# Patient Record
Sex: Male | Born: 1960
Health system: Southern US, Community
[De-identification: ages and names within clinical notes are randomized; demographics above are authoritative.]

## PROBLEM LIST (undated history)

## (undated) DIAGNOSIS — F32A Depression, unspecified: Secondary | ICD-10-CM

## (undated) DIAGNOSIS — C801 Malignant (primary) neoplasm, unspecified: Secondary | ICD-10-CM

## (undated) DIAGNOSIS — I10 Essential (primary) hypertension: Secondary | ICD-10-CM

## (undated) DIAGNOSIS — E785 Hyperlipidemia, unspecified: Secondary | ICD-10-CM

## (undated) DIAGNOSIS — F329 Major depressive disorder, single episode, unspecified: Secondary | ICD-10-CM

## (undated) DIAGNOSIS — E119 Type 2 diabetes mellitus without complications: Secondary | ICD-10-CM

## (undated) DIAGNOSIS — D61818 Other pancytopenia: Secondary | ICD-10-CM

## (undated) DIAGNOSIS — K7460 Unspecified cirrhosis of liver: Secondary | ICD-10-CM

## (undated) DIAGNOSIS — F1010 Alcohol abuse, uncomplicated: Secondary | ICD-10-CM

## (undated) DIAGNOSIS — D696 Thrombocytopenia, unspecified: Secondary | ICD-10-CM

## (undated) HISTORY — DX: Major depressive disorder, single episode, unspecified: F32.9

## (undated) HISTORY — DX: Thrombocytopenia, unspecified: D69.6

## (undated) HISTORY — PX: OTHER SURGICAL HISTORY: SHX169

## (undated) HISTORY — DX: Other pancytopenia: D61.818

## (undated) HISTORY — DX: Hyperlipidemia, unspecified: E78.5

## (undated) HISTORY — DX: Essential (primary) hypertension: I10

## (undated) HISTORY — DX: Depression, unspecified: F32.A

## (undated) HISTORY — DX: Type 2 diabetes mellitus without complications: E11.9

---

## 1999-02-20 ENCOUNTER — Inpatient Hospital Stay (HOSPITAL_COMMUNITY): Admission: AD | Admit: 1999-02-20 | Discharge: 1999-02-23 | Payer: Self-pay | Admitting: *Deleted

## 2003-08-09 ENCOUNTER — Inpatient Hospital Stay (HOSPITAL_COMMUNITY): Admission: EM | Admit: 2003-08-09 | Discharge: 2003-08-12 | Payer: Self-pay | Admitting: Psychiatry

## 2003-10-07 ENCOUNTER — Inpatient Hospital Stay (HOSPITAL_COMMUNITY): Admission: RE | Admit: 2003-10-07 | Discharge: 2003-10-12 | Payer: Self-pay | Admitting: Psychiatry

## 2008-06-21 ENCOUNTER — Emergency Department (HOSPITAL_COMMUNITY): Admission: EM | Admit: 2008-06-21 | Discharge: 2008-06-21 | Payer: Self-pay | Admitting: Emergency Medicine

## 2010-02-12 ENCOUNTER — Encounter: Payer: Self-pay | Admitting: Gastroenterology

## 2010-03-30 ENCOUNTER — Encounter (INDEPENDENT_AMBULATORY_CARE_PROVIDER_SITE_OTHER): Payer: Self-pay | Admitting: *Deleted

## 2010-03-30 ENCOUNTER — Ambulatory Visit: Payer: Self-pay | Admitting: Gastroenterology

## 2010-04-19 ENCOUNTER — Ambulatory Visit: Payer: Self-pay | Admitting: Gastroenterology

## 2010-04-19 ENCOUNTER — Ambulatory Visit (HOSPITAL_COMMUNITY): Admission: RE | Admit: 2010-04-19 | Discharge: 2010-04-19 | Payer: Self-pay | Admitting: Gastroenterology

## 2010-07-16 ENCOUNTER — Encounter: Payer: Self-pay | Admitting: Cardiology

## 2010-08-13 ENCOUNTER — Encounter: Payer: Self-pay | Admitting: Cardiology

## 2010-08-14 ENCOUNTER — Encounter: Payer: Self-pay | Admitting: Cardiology

## 2010-08-28 NOTE — Letter (Signed)
Summary: [facility] Instructions  [facility]  [address] [place], [place] [address]   Phone: [phone]  Fax: [phone]       Chris Alvarez    1974/10/24    MRN: [id number]        Procedure Day /Date:04/19/10  Chris Alvarez     Arrival Time:8 am     Procedure Time:10 am     Location of Procedure:                     X  [facility] ( Outpatient Registration)                        PREPARATION FOR COLONOSCOPY WITH MOVIPREP   Starting 5 days prior to your procedure 04/14/10 do not eat nuts, seeds, popcorn, corn, beans, peas,  salads, or any raw vegetables.  Do not take any fiber supplements (e.g. Metamucil, Citrucel, and Benefiber).  THE DAY BEFORE YOUR PROCEDURE         DATE: 04/18/10  DAY: WED  1.  Drink clear liquids the entire day-NO SOLID FOOD  2.  Do not drink anything colored red or purple.  Avoid juices with pulp.  No orange juice.  3.  Drink at least 64 oz. (8 glasses) of fluid/clear liquids during the day to prevent dehydration and help the prep work efficiently.  CLEAR LIQUIDS INCLUDE: Water Jello Ice Popsicles Tea (sugar ok, no milk/cream) Powdered fruit flavored drinks Coffee (sugar ok, no milk/cream) Gatorade Juice: apple, white grape, white cranberry  Lemonade Clear bullion, consomm, broth Carbonated beverages (any kind) Strained chicken noodle soup Hard Candy                             4.  In the morning, mix first dose of MoviPrep solution:    Empty 1 Pouch A and 1 Pouch B into the disposable container    Add lukewarm drinking water to the top line of the container. Mix to dissolve    Refrigerate (mixed solution should be used within 24 hrs)  5.  Begin drinking the prep at 5:00 p.m. The MoviPrep container is divided by 4 marks.   Every 15 minutes drink the solution down to the next mark (approximately 8 oz) until the full liter is complete.   6.  Follow completed prep with 16 oz of clear liquid of your choice (Nothing  red or purple).  Continue to drink clear liquids until bedtime.  7.  Before going to bed, mix second dose of MoviPrep solution:    Empty 1 Pouch A and 1 Pouch B into the disposable container    Add lukewarm drinking water to the top line of the container. Mix to dissolve    Refrigerate  THE DAY OF YOUR PROCEDURE      DATE: 04/19/10  DAY: Chris Alvarez  Beginning at 5 a.m. (5 hours before procedure):         1. Every 15 minutes, drink the solution down to the next mark (approx 8 oz) until the full liter is complete.  Nothing to eat or drink after midnight except the prep solution  MEDICATION INSTRUCTIONS  Unless otherwise instructed, you should take regular prescription medications with a small sip of water   as early as possible the morning of your procedure.  Diabetic patients - see separate instructions.  OTHER INSTRUCTIONS  You will need a responsible adult at least 51 years of age to accompany you and drive you home.   This person must remain in the waiting room during your procedure.  Wear loose fitting clothing that is easily removed.  Leave jewelry and other valuables at home.  However, you may wish to bring a book to read or  an iPod/MP3 player to listen to music as you wait for your procedure to start.  Remove all body piercing jewelry and leave at home.  Total time from sign-in until discharge is approximately 2-3 hours.  You should go home directly after your procedure and rest.  You can resume normal activities the  day after your procedure.  The day of your procedure you should not:   Drive   Make legal decisions   Operate machinery   Drink alcohol   Return to work  You will receive specific instructions about eating, activities and medications before you leave.    The above instructions have been reviewed and explained to me by   _______________________    I fully understand and can verbalize these instructions  _____________________________ Date _________

## 2010-08-28 NOTE — Assessment & Plan Note (Signed)
History of Present Illness Visit Type: consult  Primary GI MD: Rob Bunting MD Primary Fernande Treiber: Edwena Felty. Charm Barges, DO Requesting Adi Seales: Edwena Felty. Charm Barges, DO Chief Complaint: needs colonoscopy History of Present Illness:     very pleasant Chris Alvarez who has seen Dr. Karilyn Cota in [place] about one year ago.  An infection was found, he was put on antibiotics.  He was having dypshagia, was stretched during EGD.  No longer has any UGI symptoms as long as he takes omeprazole.  he has never had a colonoscopy.  he tends to be constipated, probably from narcottics.  Takes miralax as needed.  no overt GI bleeding.  He takes chronic narcotic pain medicines for back problems           Current Medications (verified): 1)  Oxycontin 40 Mg Xr12h-Tab (Oxycodone Hcl) .Marland Kitchen.. 1 By Mouth Q 8 Hours 2)  Percocet 10-325 Mg Tabs (Oxycodone-Acetaminophen) .Marland Kitchen.. 1 By Mouth Q 4 Hours 3)  Tramadol Hcl 50 Mg Tabs (Tramadol Hcl) .... 2 By Mouth Q 6 Hours 4)  Miralax  Powd (Polyethylene Glycol 3350) .... As Needed 5)  Androgel Pump 1 % Gel (Testosterone) .... 2 Pumps Daily 6)  Zyrtec Hives Relief 10 Mg Tabs (Cetirizine Hcl) .Marland Kitchen.. 1 By Mouth Once Daily 7)  Crestor 10 Mg Tabs (Rosuvastatin Calcium) .Marland Kitchen.. 1 By Mouth Once Daily 8)  Fenofibrate 160 Mg Tabs (Fenofibrate) .... One Tablet By Mouth Once Daily 9)  Metformin Hcl 500 Mg Tabs (Metformin Hcl) .... One Tablet By Mouth Once Daily If Needed Will Take Two Once Daily 10)  Flonase 50 Mcg/act Susp (Fluticasone Propionate) .... 2 Sprays Each Nostril Daily 11)  Byetta 10 Mcg Pen 10 Mcg/0.70ml Soln (Exenatide) .Marland Kitchen.. 1 Inj Two Times A Day 12)  Allegra 180 Mg Tabs (Fexofenadine Hcl) .Marland Kitchen.. 1 By Mouth Once Daily 13)  Xanax 1 Mg Tabs (Alprazolam) .Marland Kitchen.. 1 Three Times A Day 14)  Omeprazole 20 Mg Cpdr (Omeprazole) .Marland Kitchen.. 1 By Mouth Once Daily 15)  Lovaza 1 Gm Caps (Omega-3-Acid Ethyl Esters) .... 4 By Mouth Once Daily 16)  Ventolin Hfa 108 (90 Base) Mcg/act Aers (Albuterol  Sulfate) .Marland Kitchen.. 1-2 Puffs Q 4 Hours As Needed 17)  Proair Hfa 108 (90 Base) Mcg/act Aers (Albuterol Sulfate) .Marland Kitchen.. 1 -2 Puffs Q 4-6 Hours As Needed 18)  Lisinopril 20 Mg Tabs (Lisinopril) .... One Tablet By Mouth Once Daily  Allergies (verified): 1)  ! Sulfa  Past History:  Past Medical History: Diabetes Hyperlipidemia Obesity smoke cigarettes  Past Surgical History: Knee Replacement    Family History: colon cancer  Social History: he is divorced, he has 2 children, he currently smokes cigarettes, he drinks one to 2 alcoholic beverages per week  Review of Systems       Pertinent positive and negative review of systems were noted in the above HPI and GI specific review of systems.  All other review of systems was otherwise negative.   Vital Signs:  Patient profile:   Chris year old male Height:      69 inches Weight:      226 pounds BMI:     33.50 BSA:     2.18 Pulse rate:   88 / minute Pulse rhythm:   regular BP sitting:   136 / 82  (left arm) Cuff size:   regular  Vitals Entered By: Ok Anis CMA (March 30, 2010 1:07 PM)  Physical Exam  Additional Exam:  Constitutional: generally well appearing Psychiatric: alert and oriented times  3 Eyes: extraocular movements intact Mouth: oropharynx moist, no lesions Neck: supple, no lymphadenopathy Cardiovascular: heart regular rate and rythm Lungs: CTA bilaterally Abdomen: soft, non-tender, non-distended, no obvious ascites, no peritoneal signs, normal bowel sounds Extremities: no lower extremity edema bilaterally Skin: no lesions on visible extremities    Impression & Recommendations:  Problem # 1:  Routine risk for colon cancer given his chronic narcotic usage he would probably be difficult to sedate with moderate sedation that we use our outpatient endoscopy Center. I will therefore schedule him for a colonoscopy at [facility] getting anesthesia's assistance.  Problem # 2:  GERD his symptoms are  under very good control on once daily proton pump inhibitor. He has no alarm symptoms I see no reason for repeating EGD that he had done one year ago with outside gastroenterologist.  Patient Instructions: 1)  You should stop smoking. 2)  You will be scheduled to have a colonoscopy at [facility] with propofol sedation. 3)  A copy of this information will be sent to Dr. Charm Barges. 4)  The medication list was reviewed and reconciled.  All changed / newly prescribed medications were explained.  A complete medication list was provided to the patient / caregiver.  Appended Document: Orders Update/movi    Clinical Lists Changes  Problems: Added new problem of SPECIAL SCREENING FOR MALIGNANT NEOPLASMS COLON (ICD-V76.Chris) Medications: Added new medication of MOVIPREP 100 GM  SOLR (PEG-KCL-NACL-NASULF-NA ASC-C) As per prep instructions. - Signed Rx of MOVIPREP 100 GM  SOLR (PEG-KCL-NACL-NASULF-NA ASC-C) As per prep instructions.;  #1 x 0;  Signed;  Entered by: Chales Abrahams CMA (AAMA);  Authorized by: Rachael Fee MD;  Method used: Electronically to [facility] [address] [place], [place], [place]  [address], Ph: [phone], Fax: [phone] Orders: Added new Test order of ZCOL (ZCOL) - Signed    Prescriptions: MOVIPREP 100 GM  SOLR (PEG-KCL-NACL-NASULF-NA ASC-C) As per prep instructions.  #1 x 0   Entered by:   Chales Abrahams CMA (AAMA)   Authorized by:   Rachael Fee MD   Signed by:   Chales Abrahams CMA (AAMA) on 03/30/2010   Method used:   Electronically to        [facility] (retail)       [address]       [place], [place]  [address]       Ph: [phone]       Fax: [phone]   RxID:   [id number]

## 2010-08-28 NOTE — Letter (Signed)
Summary: New Patient letter  [facility]  [address] [place], [place] [address]   Phone: [phone]  Fax: [phone]       02/12/2010 MRN: [id number]  Chris Alvarez [address] [place], [place]  [address]  Dear Chris Alvarez,  Welcome to the Gastroenterology Division at [facility].    You are scheduled to see Dr.  Christella Hartigan on 03-30-10 at 1:30pm on the 3rd floor at [facility], [address]  We ask that you try to arrive at our office 15 minutes prior to your appointment time to allow for check-in.  We would like you to complete the enclosed self-administered evaluation form prior to your visit and bring it with you on the day of your appointment.  We will review it with you.  Also, please bring a complete list of all your medications or, if you prefer, bring the medication bottles and we will list them.  Please bring your insurance card so that we may make a copy of it.  If your insurance requires a referral to see a specialist, please bring your referral form from your primary care physician.  Co-payments are due at the time of your visit and may be paid by cash, check or credit card.     Your office visit will consist of a consult with your physician (includes a physical exam), any laboratory testing he/she may order, scheduling of any necessary diagnostic testing (e.g. x-ray, ultrasound, CT-scan), and scheduling of a procedure (e.g. Endoscopy, Colonoscopy) if required.  Please allow enough time on your schedule to allow for any/all of these possibilities.    If you cannot keep your appointment, please call [phone] to cancel or reschedule prior to your appointment date.  This allows [place] the opportunity to schedule an appointment for another patient in need of care.  If you do not cancel or reschedule by 5 p.m. the business day prior to your appointment date, you will be charged a $50.00 late cancellation/no-show fee.    Thank you for choosing [facility] for your medical needs.  We appreciate the opportunity to care for you.  Please visit [place] at our website  to learn more about our practice.                     Sincerely,                                                             The Gastroenterology Division

## 2010-08-28 NOTE — Procedures (Signed)
Summary: Colon Prep  Colon Prep   Imported By: Lester Hosston 04/06/2010 11:32:10  _____________________________________________________________________  External Attachment:    Type:   Image     Comment:   External Document

## 2010-08-28 NOTE — Letter (Signed)
Summary: Diabetic Instructions  [facility]  [address] [place], [place] [address]   Phone: [phone]  Fax: [phone]    Chris Alvarez 11/24/1960 MRN: [id number]   X   ORAL DIABETIC MEDICATION INSTRUCTIONS  The day before your procedure:   Take your diabetic pill as you do normally  The day of your procedure:   Do not take your diabetic pill    We will check your blood sugar levels during the admission process and again in Recovery before discharging you home  ________________________________________________________________________

## 2010-08-28 NOTE — Procedures (Signed)
Summary: Colonoscopy  Patient: [name] Note: All result statuses are Final unless otherwise noted.  Tests: (1) Colonoscopy (COL)   COL Colonoscopy           DONE     [facility]     [address] [place], [place]  [address]           COLONOSCOPY PROCEDURE REPORT           PATIENT:  Chris Alvarez, Chris Alvarez  MR#:  [id number]     BIRTHDATE:  10-28-1960, 49 yrs. old  GENDER:  male     ENDOSCOPIST:  Rachael Fee, MD     REF. BY:  [name],     PROCEDURE DATE:  04/19/2010     PROCEDURE:  Diagnostic Colonoscopy     ASA CLASS:  Class II     INDICATIONS:  Routine Risk Screening     MEDICATIONS:  MAC sedation, administered by CRNA           DESCRIPTION OF PROCEDURE:   After the risks benefits and     alternatives of the procedure were thoroughly explained, informed     consent was obtained.  Digital rectal exam was performed and     revealed no rectal masses.   The  endoscope was introduced through     the anus and advanced to the cecum, which was identified by both     the appendix and ileocecal valve, without limitations.  The     quality of the prep was good, using MoviPrep.  The instrument was     then slowly withdrawn as the colon was fully examined.     <<PROCEDUREIMAGES>>     FINDINGS:  Mild diverticulosis was found throughout the colon.     This was otherwise a normal examination of the colon (see image1,     image2, and image3).   Retroflexed views in the rectum revealed no     abnormalities.    The scope was then withdrawn from the patient     and the procedure completed.     COMPLICATIONS:  None           ENDOSCOPIC IMPRESSION:     1) Mild diverticulosis throughout the colon     2) Otherwise normal examination           RECOMMENDATIONS:     1) Continue current colorectal screening recommendations for     "routine risk" patients with a repeat colonoscopy in 10 years.           REPEAT EXAM:  10 years           ______________________________   Rachael Fee, MD           n.     eSIGNED:   Rachael Fee at 04/19/2010 10:52 AM           Myer Haff, [id number]  Note: An exclamation mark (!) indicates a result that was not dispersed into the flowsheet. Document Creation Date: 04/19/2010 10:54 AM _______________________________________________________________________  (1) Order result status: Final Collection or observation date-time: 04/19/2010 10:48 Requested date-time:  Receipt date-time:  Reported date-time:  Referring Physician:   Ordering Physician: Rob Bunting [phone]) Specimen Source:  Source: Launa Grill Order Number: [id number] Lab site:   Appended Document: Colonoscopy needs recall colonoscopy in 10 years  Appended Document: Colonoscopy Clinical Lists Changes  Observations: Added new observation of COLONNXTDUE: 03/2020 (04/19/2010 11:57)

## 2010-08-30 NOTE — Medication Information (Signed)
Summary: Environmental health practitioner MEDICATION [facility]  RX Folder/ MEDICATION [facility]   Imported By: Dorise Hiss 08/14/2010 15:41:50  _____________________________________________________________________  External Attachment:    Type:   Image     Comment:   External Document

## 2010-08-30 NOTE — Letter (Signed)
Summary: External Correspondence/ [facility]  External Correspondence/ [facility]   Imported By: Dorise Hiss 08/14/2010 15:43:20  _____________________________________________________________________  External Attachment:    Type:   Image     Comment:   External Document

## 2010-09-04 ENCOUNTER — Encounter: Payer: Self-pay | Admitting: Cardiology

## 2010-09-17 DIAGNOSIS — R079 Chest pain, unspecified: Secondary | ICD-10-CM | POA: Insufficient documentation

## 2010-09-17 DIAGNOSIS — E119 Type 2 diabetes mellitus without complications: Secondary | ICD-10-CM | POA: Insufficient documentation

## 2010-09-17 DIAGNOSIS — E785 Hyperlipidemia, unspecified: Secondary | ICD-10-CM | POA: Insufficient documentation

## 2010-09-17 DIAGNOSIS — F17200 Nicotine dependence, unspecified, uncomplicated: Secondary | ICD-10-CM | POA: Insufficient documentation

## 2010-09-18 ENCOUNTER — Ambulatory Visit (INDEPENDENT_AMBULATORY_CARE_PROVIDER_SITE_OTHER): Payer: Medicare Other | Admitting: Cardiology

## 2010-09-18 ENCOUNTER — Encounter (INDEPENDENT_AMBULATORY_CARE_PROVIDER_SITE_OTHER): Payer: Self-pay | Admitting: *Deleted

## 2010-09-18 ENCOUNTER — Encounter: Payer: Self-pay | Admitting: Cardiology

## 2010-09-18 DIAGNOSIS — E119 Type 2 diabetes mellitus without complications: Secondary | ICD-10-CM

## 2010-09-18 DIAGNOSIS — I10 Essential (primary) hypertension: Secondary | ICD-10-CM

## 2010-09-18 DIAGNOSIS — R079 Chest pain, unspecified: Secondary | ICD-10-CM

## 2010-09-18 DIAGNOSIS — E782 Mixed hyperlipidemia: Secondary | ICD-10-CM

## 2010-09-25 NOTE — Letter (Signed)
Summary: Graded Exercise Tolerance Test  [facility] [address]   [place], [place] [address]   Phone: [phone]  Fax: [phone]      [facility] Cardiovascular Services  Graded Exercise Tolerance Test    [facility]  Appointment Date:_  Appointment Time:_   Your doctor has ordered a stress test to help determine the condition of your  heart during exercise. If you take blood pressure medicine , ask your doctor if you should take it the day of your test. You may eat a light meal before your test.  Please be sure to bring the copy of your order with you.   You should dress comfortably, for example: Sweat pants, shorts, or skirt, Loose                                                                                                       short sleeved T-shirt, Rubber soled lace-up shoes (tennis shoes)  You will need to arrive 15 minutes before your appointment time. You will also need to enter at the Main Entrance of the hospital and go to the registration desk. They will direct you to the Cardiovascular Department on the third floor.  You will need to plan on being at the hospital for one hour from registration for this appointment.    You may take your medications with water the morning of your test. However, if you do not eat breakfast, you may want to hold diabetic medication.

## 2010-09-25 NOTE — Assessment & Plan Note (Signed)
Summary: NP-PALPS & DIABETES- JM   Visit Type:  Initial Consult Primary Provider:  Dr. Aram Beecham P. Butler   History of Present Illness: 50 year old male referred for cardiology consultation. He presents with a history of vague, intermittent palpitations, although no syncope. He states that generally he feels "pretty good" and has been trying to focus more on his health over the last year. He quit drinking alcohol last year, and over the last few months has lost approximately 30 pounds through walking and diet. At times he does feel a left-sided chest discomfort described as a feeling of "cold air." This is not clearly exertional. States that he sometimes feels this when he is "stressed."  Labs from 7 February showed cholesterol 123, LDL C 29, HDL C 37, triglycerides 284, LDL P 800, small LDL P 757, LDL particle size 19.8. Previous labs from December 2011 showed AST 91, ALT 81, sodium 134, potassium 5.0, hemoglobin A1c 9.6, BUN 12, creatinine 0.6, glucose 252.  He has not undergone any prior cardiac risk stratification or ischemic testing.  Preventive Screening-Counseling & Management  Alcohol-Tobacco     Smoking Status: current  Current Medications (verified): 1)  Oxycontin 40 Mg Xr12h-Tab (Oxycodone Hcl) .Marland Kitchen.. 1 By Mouth Q 8 Hours 2)  Percocet 10-325 Mg Tabs (Oxycodone-Acetaminophen) .Marland Kitchen.. 1 By Mouth Q 4 Hours 3)  Tramadol Hcl 50 Mg Tabs (Tramadol Hcl) .... 2 By Mouth Q 6 Hours 4)  Crestor 10 Mg Tabs (Rosuvastatin Calcium) .Marland Kitchen.. 1 By Mouth Once Daily 5)  Fenofibrate 160 Mg Tabs (Fenofibrate) .... One Tablet By Mouth Once Daily 6)  Metformin Hcl 500 Mg Tabs (Metformin Hcl) .... Take 1 Tablet By Mouth Two Times A Day 7)  Xanax 1 Mg Tabs (Alprazolam) .... Take 1 Tablet By Mouth Four Times A Day 8)  Omeprazole 20 Mg Cpdr (Omeprazole) .Marland Kitchen.. 1 By Mouth Once Daily 9)  Lovaza 1 Gm Caps (Omega-3-Acid Ethyl Esters) .... 4 By Mouth Once Daily 10)  Ventolin Hfa 108 (90 Base) Mcg/act Aers (Albuterol  Sulfate) .Marland Kitchen.. 1-2 Puffs Q 4 Hours As Needed 11)  Proair Hfa 108 (90 Base) Mcg/act Aers (Albuterol Sulfate) .Marland Kitchen.. 1 -2 Puffs Q 4-6 Hours As Needed 12)  Lisinopril 20 Mg Tabs (Lisinopril) .... One Tablet By Mouth Once Daily 13)  Trilipix 135 Mg Cpdr (Choline Fenofibrate) .... Take 1 Tablet By Mouth Once A Day 14)  Omeprazole 20 Mg Cpdr (Omeprazole) .... Take 1 Tablet By Mouth Once A Day 15)  Aspirin 81 Mg Tbec (Aspirin) .... Take One Tablet By Mouth Daily  Allergies (verified): 1)  ! Sulfa  Comments:  Nurse/Medical Assistant: The patient's medication list and allergies were reviewed with the patient and were updated in the Medication and Allergy Lists.  Past History:  Past Surgical History: Last updated: 03/30/2010 Knee Replacement    Family History: Last updated: 10-11-2010 Father: died age 27 with history of CAD, diabetes mellitus Mother: no history of CAD  Social History: Last updated: 2010/10/11 Divorced  Tobacco Use - Yes.  Alcohol Use - former, quit 2011 History of alcohol and cocaine use - [facility] admission 2005 Full Time - millwork, Education administrator  Past Medical History: [facility] - alcohol and cocaine use Diabetes Type 2 Depression Hypertension  Family History: Father: died age 11 with history of CAD, diabetes mellitus Mother: no history of CAD  Social History: Divorced  Tobacco Use - Yes.  Alcohol Use - former, quit 2011 History of alcohol and cocaine use - [facility] admission 2005  Full Time - millwork, painter Smoking Status:  current  Review of Systems  The patient denies anorexia, fever, syncope, dyspnea on exertion, peripheral edema, prolonged cough, hemoptysis, melena, and hematochezia.         Occasional lower back pain, occasional wheezing. Some varicose veins. Otherwise reviewed and negative except as outlined.  Vital Signs:  Patient profile:   50 year old male Height:      69 inches Weight:      208 pounds Pulse  rate:   79 / minute BP sitting:   120 / 83  (left arm) Cuff size:   large  Vitals Entered By: Carlye Grippe (September 18, 2010 10:41 AM)  Physical Exam  Additional Exam:  Overweight male in no acute distress. HEENT: Conjunctiva and lids normal, oropharynx with poor dentition. Neck: Supple, no elevated JVP or carotid bruits. No thyromegaly. Lungs: Clear to auscultation, nonlabored. Diminished overall. Cardiac: Regular rate and rhythm, no significant systolic murmur or S3. Abdomen: Soft, nontender, bowel sounds present. Extremities: No pitting edema, varicosities noted distally, distal pulses 1-2+. Skin: Warm and dry. Musculoskeletal: No kyphosis. Neuropsychiatric: Alert and oriented x3, affect appropriate.   EKG  Procedure date:  09/18/2010  Findings:      Normal sinus rhythm at 70 beats per minute.  Impression & Recommendations:  Problem # 1:  CHEST PAIN-UNSPECIFIED (ICD-786.50)  Overall atypical in description. Resting ECG is normal. He has never undergone any prior screening ischemic evaluation, with active cardiac risk factors including age and gender, family history, diabetes mellitus, hyperlipidemia, and hypertension, and tobacco abuse. A treadmill test will be ordered for further assessment. If reassuring, would recommend continued risk factor modification strategies in addition to smoking cessation. If significantly abnormal, we can discuss further evaluation.  His updated medication list for this problem includes:    Lisinopril 20 Mg Tabs (Lisinopril) ..... One tablet by mouth once daily    Aspirin 81 Mg Tbec (Aspirin) .Marland Kitchen... Take one tablet by mouth daily  Orders: EKG w/ Interpretation (93000) GXT (GXT)  Problem # 2:  TOBACCO ABUSE (ICD-305.1)  We discussed smoking cessation strategies today. At this point he is trying to "cut back" but is not at a point of taking a quit date.  Problem # 3:  DM (ICD-250.00)  Followed by Dr. Charm Barges.  The following medications  were removed from the medication list:    Byetta 10 Mcg Pen 10 Mcg/0.45ml Soln (Exenatide) .Marland Kitchen... 1 inj two times a day His updated medication list for this problem includes:    Metformin Hcl 500 Mg Tabs (Metformin hcl) .Marland Kitchen... Take 1 tablet by mouth two times a day    Lisinopril 20 Mg Tabs (Lisinopril) ..... One tablet by mouth once daily    Aspirin 81 Mg Tbec (Aspirin) .Marland Kitchen... Take one tablet by mouth daily  Problem # 4:  HYPERLIPIDEMIA-MIXED (ICD-272.4)  Followed by Dr. Charm Barges.  His updated medication list for this problem includes:    Crestor 10 Mg Tabs (Rosuvastatin calcium) .Marland Kitchen... 1 by mouth once daily    Fenofibrate 160 Mg Tabs (Fenofibrate) ..... One tablet by mouth once daily    Lovaza 1 Gm Caps (Omega-3-acid ethyl esters) .Marland KitchenMarland KitchenMarland KitchenMarland Kitchen 4 by mouth once daily    Trilipix 135 Mg Cpdr (Choline fenofibrate) .Marland Kitchen... Take 1 tablet by mouth once a day  Patient Instructions: 1)  Follow up will be based on results. 2)  Your physician has requested that you have an exercise tolerance test.  For further information please visit [email or web address]  Please  also follow instruction sheet, as given. 3)  Your physician recommends that you continue on your current medications as directed. Please refer to the Current Medication list given to you today.

## 2010-10-04 NOTE — Letter (Signed)
Summary: Internal Other/  PATIENT HISTORY FORM  Internal Other/  PATIENT HISTORY FORM   Imported By: Dorise Hiss 09/25/2010 16:33:51  _____________________________________________________________________  External Attachment:    Type:   Image     Comment:   External Document

## 2010-10-11 ENCOUNTER — Telehealth: Payer: Self-pay | Admitting: *Deleted

## 2010-10-16 NOTE — Progress Notes (Signed)
Summary: Pending GXT  Phone Note Outgoing Call Call back at [facility] Phone [phone]   Call placed by: Cyril Loosen, RN, BSN,  October 11, 2010 1:35 PM Summary of Call: Called pt in reference to GXT that was scheduled for 2/29 but has not been completed. Pt states he notified the hospital that he could not do this test b/c of transportation issues. He will call when ready to r/s. Initial call taken by: Cyril Loosen, RN, BSN,  October 11, 2010 1:36 PM
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
## 2010-12-14 NOTE — Discharge Summary (Signed)
NAME:  Chris Alvarez, Chris Alvarez                       ACCOUNT NO.:  [id number]   MEDICAL RECORD NO.:  [id number]                   PATIENT TYPE:  IPS   LOCATION:  0503                                 FACILITY:  [facility]   PHYSICIAN:  Geoffery Lyons, M.D.                   DATE OF BIRTH:  1960/12/01   DATE OF ADMISSION:  10/07/2003  DATE OF DISCHARGE:  10/12/2003                                 DISCHARGE SUMMARY   CHIEF COMPLAINT AND PRESENT ILLNESS:  This was the second admission to [facility] for this 50 year old divorced white male voluntarily  admitted.  Relapsed on alcohol and cocaine.  Feeling helpless about life  situation.  Suicidal thoughts of shooting himself.  Stated he did not have a  gun at home.  Living with the mother, feeling worthless about himself.  Frequent daily panic attacks, sleeping poorly with terminal insomnia.   PAST PSYCHIATRIC HISTORY:  Sees Dr. __________  [facility].  Second time at [facility].  Restarted Prozac one  month prior to this admission.   ALCOHOL/DRUG HISTORY:  History of alcohol and cocaine use.  Longest clean  and sober 5-6 months.   MEDICAL HISTORY:  Noncontributory.   MEDICATIONS:  Prozac 20 mg daily for the last month, Xanax (dose  undetermined), trazodone at bedtime.   PHYSICAL EXAMINATION:  Performed and failed to show any acute findings.   MENTAL STATUS EXAM:  Disheveled, middle-aged male, cooperative.  Good eye  contact.  Speech normal production, rate and tempo.  Mood depressed and  anxious.  Affect anxious.  Thought process logical and coherent.  Sense of  hopelessness, helplessness, worthlessness, guilt about relapse.  No evidence  of delusions or hallucinations.  Positive for suicidal ruminations.  No  plan.   ADMISSION DIAGNOSES:   AXIS I:  1. Major depression.  2. Alcohol and cocaine abuse; rule out dependence.  3. Anxiety disorder not otherwise specified.   AXIS II:  No  diagnosis.   AXIS III:  Chronic knee pain.   AXIS IV:  Moderate.   AXIS V:  Global Assessment of Functioning upon admission 38; highest Global  Assessment of Functioning in the last year 62.   LABORATORY DATA:  CBC within normal limits.  Blood chemistry within normal  limits.  SGOT 62, SGPT 125.  TSH 10.96.   HOSPITAL COURSE:  He was admitted and started intensive individual and group  psychotherapy.  He was maintained on Prozac.  He was initially given the  Xanax 0.5 mg as needed but he was switched to a Librium protocol so he could  be detoxed from Xanax.  Trazodone was placed on 150 mg per day.  He was  given Seroquel 25 mg at bedtime and Seroquel was increased to 25 mg twice a  day and 100 mg at bedtime.  He was able to open up and state that he  started  drinking.  His depression got worse.  He has been depressed for years.  Disabled due to his knees and mental problems.  Anxious to the point of  being agoraphobic.  Mood swings.  He did endorse multiple stressors, feeling  that he could not go on the way that he was feeling.  Feeling overwhelmed.  Ashamed and guilt for having relapsed.  As the hospitalization progressed,  he was detoxed.  He was wanting to go to a residential treatment program.  He was admitted to go to [facility].  He  understood that he needed to do something about his use and evidenced  increased insight.  With the adjustment in Seroquel, he was able to sleep  all night and he was more positive and optimistic.   DISCHARGE DIAGNOSES:   AXIS I:  1. Depressive disorder not otherwise specified.  2. Anxiety disorder not otherwise specified.  3. Cocaine and alcohol abuse.   AXIS II:  No diagnosis.   AXIS III:  Chronic knee pain.   AXIS IV:  Moderate.   AXIS V:  Global Assessment of Functioning upon discharge 50.   DISCHARGE MEDICATIONS:  1. Prozac 20 mg per day.  2. Trazodone 150 mg at night.  3. Seroquel 25 mg twice a day  and 100 mg at night.  4. Protonix 40 mg daily.   FOLLOW UP:  To go to [place] to work on long-term abstinence.                                               Geoffery Lyons, M.D.    IL/MEDQ  D:  11/06/2003  T:  11/06/2003  Job:  [id number]

## 2011-01-31 ENCOUNTER — Encounter: Payer: Self-pay | Admitting: Cardiology

## 2015-01-14 ENCOUNTER — Emergency Department (HOSPITAL_COMMUNITY): Payer: Medicare Other

## 2015-01-14 ENCOUNTER — Emergency Department (HOSPITAL_COMMUNITY)
Admission: EM | Admit: 2015-01-14 | Discharge: 2015-01-14 | Disposition: A | Discharge status: Home / Self Care | Payer: Medicare Other | Attending: Emergency Medicine | Admitting: Emergency Medicine

## 2015-01-14 ENCOUNTER — Encounter (HOSPITAL_COMMUNITY): Payer: Self-pay | Admitting: Emergency Medicine

## 2015-01-14 DIAGNOSIS — E785 Hyperlipidemia, unspecified: Secondary | ICD-10-CM | POA: Diagnosis not present

## 2015-01-14 DIAGNOSIS — L959 Vasculitis limited to the skin, unspecified: Secondary | ICD-10-CM | POA: Diagnosis not present

## 2015-01-14 DIAGNOSIS — F329 Major depressive disorder, single episode, unspecified: Secondary | ICD-10-CM | POA: Diagnosis not present

## 2015-01-14 DIAGNOSIS — Z79899 Other long term (current) drug therapy: Secondary | ICD-10-CM | POA: Insufficient documentation

## 2015-01-14 DIAGNOSIS — I10 Essential (primary) hypertension: Secondary | ICD-10-CM | POA: Insufficient documentation

## 2015-01-14 DIAGNOSIS — E119 Type 2 diabetes mellitus without complications: Secondary | ICD-10-CM | POA: Insufficient documentation

## 2015-01-14 DIAGNOSIS — Z720 Tobacco use: Secondary | ICD-10-CM | POA: Insufficient documentation

## 2015-01-14 DIAGNOSIS — M7989 Other specified soft tissue disorders: Secondary | ICD-10-CM | POA: Diagnosis present

## 2015-01-14 DIAGNOSIS — Z7982 Long term (current) use of aspirin: Secondary | ICD-10-CM | POA: Diagnosis not present

## 2015-01-14 DIAGNOSIS — D696 Thrombocytopenia, unspecified: Secondary | ICD-10-CM | POA: Diagnosis not present

## 2015-01-14 LAB — CBC WITH DIFFERENTIAL/PLATELET
Basophils Absolute: 0 10*3/uL (ref 0.0–0.1)
Basophils Relative: 1 % (ref 0–1)
EOS PCT: 6 % — AB (ref 0–5)
Eosinophils Absolute: 0.2 10*3/uL (ref 0.0–0.7)
HCT: 36.3 % — ABNORMAL LOW (ref 39.0–52.0)
HEMOGLOBIN: 12.1 g/dL — AB (ref 13.0–17.0)
LYMPHS ABS: 0.9 10*3/uL (ref 0.7–4.0)
LYMPHS PCT: 21 % (ref 12–46)
MCH: 31.8 pg (ref 26.0–34.0)
MCHC: 33.3 g/dL (ref 30.0–36.0)
MCV: 95.3 fL (ref 78.0–100.0)
MONO ABS: 0.5 10*3/uL (ref 0.1–1.0)
Monocytes Relative: 11 % (ref 3–12)
Neutro Abs: 2.6 10*3/uL (ref 1.7–7.7)
Neutrophils Relative %: 62 % (ref 43–77)
Platelets: 74 10*3/uL — ABNORMAL LOW (ref 150–400)
RBC: 3.81 MIL/uL — AB (ref 4.22–5.81)
RDW: 15.5 % (ref 11.5–15.5)
WBC: 4.2 10*3/uL (ref 4.0–10.5)

## 2015-01-14 LAB — HEPATIC FUNCTION PANEL
ALK PHOS: 125 U/L (ref 38–126)
ALT: 27 U/L (ref 17–63)
AST: 47 U/L — ABNORMAL HIGH (ref 15–41)
Albumin: 3.6 g/dL (ref 3.5–5.0)
BILIRUBIN DIRECT: 0.3 mg/dL (ref 0.1–0.5)
BILIRUBIN INDIRECT: 0.5 mg/dL (ref 0.3–0.9)
Total Bilirubin: 0.8 mg/dL (ref 0.3–1.2)
Total Protein: 7.2 g/dL (ref 6.5–8.1)

## 2015-01-14 LAB — PROTIME-INR
INR: 1.41 (ref 0.00–1.49)
Prothrombin Time: 17.3 seconds — ABNORMAL HIGH (ref 11.6–15.2)

## 2015-01-14 LAB — BASIC METABOLIC PANEL
Anion gap: 10 (ref 5–15)
BUN: 17 mg/dL (ref 6–20)
CALCIUM: 8.6 mg/dL — AB (ref 8.9–10.3)
CO2: 28 mmol/L (ref 22–32)
Chloride: 96 mmol/L — ABNORMAL LOW (ref 101–111)
Creatinine, Ser: 0.81 mg/dL (ref 0.61–1.24)
GFR calc Af Amer: >60 mL/min (ref >60)
GFR calc non Af Amer: >60 mL/min (ref >60)
GLUCOSE: 236 mg/dL — AB (ref 65–99)
Potassium: 4 mmol/L (ref 3.5–5.1)
SODIUM: 134 mmol/L — AB (ref 135–145)

## 2015-01-14 MED ORDER — METHYLPREDNISOLONE 4 MG PO TBPK: ORAL_TABLET | ORAL | Status: DC

## 2015-01-14 MED ORDER — DOXYCYCLINE HYCLATE 100 MG PO TABS: 100.0000 mg | ORAL_TABLET | Freq: Two times a day (BID) | ORAL | Status: DC

## 2015-01-14 NOTE — ED Notes (Signed)
Went to review d/c instructions with pt, pt not in room, EDP made aware

## 2015-01-14 NOTE — Discharge Instructions (Signed)
°Emergency Department Resource Guide °1) Find a Doctor and Pay Out of Pocket °Although you won't have to find out who is covered by your insurance plan, it is a good idea to ask around and get recommendations. You will then need to call the office and see if the doctor you have chosen will accept you as a new patient and what types of options they offer for patients who are self-pay. Some doctors offer discounts or will set up payment plans for their patients who do not have insurance, but you will need to ask so you aren't surprised when you get to your appointment. ° °2) Contact Your Local Health Department °Not all health departments have doctors that can see patients for sick visits, but many do, so it is worth a call to see if yours does. If you don't know where your local health department is, you can check in your phone book. The CDC also has a tool to help you locate your state's health department, and many state websites also have listings of all of their local health departments. ° °3) Find a Walk-in Clinic °If your illness is not likely to be very severe or complicated, you may want to try a walk in clinic. These are popping up all over the country in pharmacies, drugstores, and shopping centers. They're usually staffed by nurse practitioners or physician assistants that have been trained to treat common illnesses and complaints. They're usually fairly quick and inexpensive. However, if you have serious medical issues or chronic medical problems, these are probably not your best option. ° °No Primary Care Doctor: °- Call [facility] at  832-8000 - they can help you locate a primary care doctor that  accepts your insurance, provides certain services, etc. °- Physician Referral Service- 1-800-533-3463 ° °Chronic Pain Problems: °Organization         Address  Phone   Notes  °[facility]  (336) [phone] Patients need to be referred by their primary care doctor.  ° °Medication  Assistance: °Organization         Address  Phone   Notes  °[place] [facility] [address] °[place], [place] [address] (336) 641-8030 --Must be a resident of [place] °-- Must have NO insurance coverage whatsoever (no Medicaid/ Medicare, etc.) °-- The pt. MUST have a primary care doctor that directs their care regularly and follows them in the community °  °[facility]  (866) 331-1348   °[employer]  (888) 892-1162   ° °Agencies that provide inexpensive medical care: °Organization         Address  Phone   Notes  °[facility]  (336) 832-8035   °[facility]    (336) 832-7272   °[facility] [address] °[place], [place] [address] (336) 832-4777   °[facility] [address], °[place] (336) 271-4999   °[facility]    (336) 373-0678   °[facility]    (336) 272-1050   °[facility] ° [address], [place] Phone:  (336) 832-4444, Fax:  (336) 832-4440 Hours of Operation:  9 am - 6 pm, M-F.  Also accepts Medicaid/Medicare and self-pay.  °[facility] ° [address], [place] Phone: (336) [phone], Fax: (336) [phone]. Hours of Operation:  8:30 am - 5:30 pm, M-F.  Also accepts Medicaid and self-pay.  °[facility] [place] [address], [place] Phone: (336) 878-6027   °[facility] [address], [place], [place] (336)723-1848, Ext. 123 Mondays & Thursdays: 7-9 AM.  First 15 patients are seen on a first come, first serve basis. °  ° °Medicaid-accepting [place] Providers: ° °Organization         Address  Phone   Notes  °[facility] [address], [place] (336) 641-2100 Also accepts self-pay patients.  °[facility] [address], [place] ° (336) 856-9996   °[facility] [address], [place]  (336) 288-8857   °[facility] [address] [place] [address], [place] (336) 299-7000   °Veita Bland [address], [place]  ° (336) 373-1557 Only accepts [place] Access Medicaid patients after they have their name applied to their card.  ° °Self-Pay (no insurance) in [place]: ° °Organization         Address  Phone   Notes  °Sickle Cell Patients, [facility] [address], [place] (336) 832-1970   °[facility] [address], [place] (336) 832-4400   °[facility] ° [address], [place] (336) 992-4800   °[facility]/Dr. Osei-Bonsu ° [address] [place] [address], [place] or [address], [place] (336) [phone] Phone number for both [place] and [place] locations is the same.  °[facility] [address], [place] (336) [phone]   °[facility] [address] [place] [address], [place] or [address] (336) 852-7530 °(336) 878-2260   °[facility] [address], [place] (336) 350-1642, phone; (336) 294-5005, fax Sees patients 1st and 3rd Saturday of every month.  Must not qualify for public or private insurance (i.e. Medicaid, Medicare, [place] Health Choice, Veterans' Benefits) • Household income should be no more than 200% of the poverty level •The clinic cannot treat you if you are pregnant or think you are pregnant • Sexually transmitted diseases are not treated at the clinic.  ° ° °Dental Care: °Organization         Address  Phone  Notes  °[place] [facility] [address], [place] (336) 641-6152 Accepts children up to age 21 who are enrolled in Medicaid or [place] Health Choice; pregnant women with a Medicaid card; and children who have applied for Medicaid or [place] Health Choice, but were declined, whose parents can pay a reduced fee at time of service.  °[place]  [facility] [place]  [address], [place] (336) 641-7733 Accepts children up to age 21 who are enrolled in Medicaid or [place] Health Choice; pregnant women with a Medicaid card; and children who have applied for Medicaid or [place] Health Choice, but were declined, whose parents can pay a reduced fee at time of service.  °[facility] ° [address],  (336) 641-4533 Patients are seen by appointment only. Walk-ins are not accepted. [facility] will see patients 18 years of age and older. °Monday - Tuesday (8am-5pm) °Most Wednesdays (8:30-5pm) °$30 per visit, cash only  °[facility] ° [address], [place] (336) 641-4533 Patients are seen by appointment only. Walk-ins are not accepted. [facility] will see patients 18 years of age and older. °One   Wednesday Evening (Monthly: Volunteer Based).  $30 per visit, cash only  °[facility]  (919) 537-3737 for adults; Children under age 4, call Graduate Pediatric Dentistry at ([phone]) [phone]. Children aged 4-14, please call ([phone]) [phone] to request a pediatric application. ° Dental services are provided in all areas of dental care including fillings, crowns and bridges, complete and partial dentures, implants, gum treatment, root canals, and extractions. Preventive care is also provided. Treatment is provided to both adults and children. °Patients are selected via a lottery and there is often a waiting list. °  °[facility] [address], °[place] ° (336) 763-8833 [email or web address] °  °[facility] [address], [place], [place] (336)723-1848, Ext. 123 Second and Fourth Thursday of each month, opens at 6:30 AM; Clinic ends at 9 AM.  Patients are seen on a first-come first-served basis, and a limited number are seen during each clinic.  ° °[facility] ° [address], [place], [place] (336) [phone]    Eligibility Requirements °You must have lived in [place], [place], or [place] counties for at least the last three months. °  You cannot be eligible for state or federal sponsored healthcare insurance, including Veterans Administration, Medicaid, or Medicare. °  You generally cannot be eligible for healthcare insurance through your employer.  °  How to apply: °Eligibility screenings are held every Tuesday and Wednesday afternoon from 1:00 pm until 4:00 pm. You do not need an appointment for the interview!  °[facility] [address], [place], [place] 336-631-2330   °[facility]  336-342-8273   °[facility]  336-703-3100   °[facility]  336-570-6415   ° °Behavioral Health Resources in the Community: °Intensive Outpatient Programs °Organization         Address  Phone  Notes  °[facility] [address], [place], [place] 336-878-6098   °[facility] [address], [place], [place] 336-832-9800   °[facility] [address], [place], [place] ° 336-882-2125   °[facility] [address],  °[place], [place] 1-800-853-5163 or 336-641-4981   °Substance Abuse Resources °Organization         Address  Phone  Notes  °[facility]  336-882-2125   °[facility]  336-784-9470   °[facility]  336-285-9073   °[facility]  336-845-3988   °Residential & Outpatient Substance Abuse Program  1-800-659-3381   °Psychological Services °Organization         Address  Phone  Notes  °[facility]  336- 832-9600   °[facility]  336- 378-7881   °[facility] [address], [place] 1-800-853-5163 or 336-641-4981   ° °Mobile Crisis Teams °Organization         Address  Phone  Notes  °[facility], Mobile Crisis Care Unit  1-877-626-1772   °Assertive °[facility] ° [address]  [place], [place] 336-834-9664   °Sharon DeEsch [address] °[place] 336-554-5454   ° °Self-Help/Support Groups °Organization         Address  Phone             Notes  °[facility] - variety of support groups  336- 373-1402 Call for more information  °Narcotics Anonymous (NA), [facility] [address], °[place]  2 meetings at this location  ° °  Residential Treatment Programs Organization         Address  Phone  Notes  [facility] [address],    [place]  [phone]   [facility]  [address] [place] [address], [place], [place]   [place], [place] [phone] Admissions: 8am-3pm M-F  [facility] [address],    [place], [place] [phone]   [facility] [address] [place], [place], [place]   [facility] [address],  [place], [place]   [facility] Intensive Outpatient [address], [place], [place]   [facility] [address]  [place], [place] 1-719-313-9947 or (336)211-9816   [facility] [address] [place], [place] Accepts Medicaid  [facility] [address],  [place] 1-231-188-5118 Substance Abuse/Addiction Treatment   [facility] Organization         Address  Phone  Notes  [facility]  (919)083-5725   Domenic Schwab, PhD [address] [place], [place]   (315)063-5954 or 912-507-2667   [facility] [place], [place] [phone]   [facility] [address] [place], [place] [phone] Insurance/Medicaid/sponsorship through [facility] [address]                                    [place], [place] [phone] [facility] [address], [place] [phone]    Dr. Adele Schilder  [phone]   [facility] Dept. 1) [address] [place] 2) [place] 3)  [address], [place] [phone]  [phone]   [place] [phone] or ([phone] (After Hours)      Take the prescriptions as directed. Your platelet count was lower than normal on your labs today.  Call your regular medical doctor on Monday to schedule a follow up appointment within the next 2 days to recheck your platelet count and obtain further rheumatological workup.  Return to the Emergency Department immediately sooner if worsening.

## 2015-01-14 NOTE — ED Notes (Signed)
Ultrasound has been done.

## 2015-01-14 NOTE — ED Provider Notes (Signed)
CSN: [id number]     Arrival date & time 01/14/15  0754 History   First MD Initiated Contact with Patient 01/14/15 0809     Chief Complaint  Patient presents with  . Leg Swelling      HPI Pt was seen at 0805. Per pt, c/o gradual onset and persistence of constant bilat LE's "swelling" and "rash" for the past 3 days. Rash is located just above his socks line bilat. Pt states called his PMD who rx HCTZ with improvement in the swelling. States his symptoms began after "working outside in the yard." Pt states "the same thing happened last year after working outside in the yard" but he did not seek medical treatment "because it just went away after a few days." Denies any other areas of rash, no fevers, no pruritis, no injury, no open wounds, no joint pain, no focal motor weakness, no tingling/numbness in extremities, no back pain, no abd pain.    Past Medical History  Diagnosis Date  . HLD (hyperlipidemia)   . DM2 (diabetes mellitus, type 2)   . Depressed   . HTN (hypertension)    Past Surgical History  Procedure Laterality Date  . Knee replacement (other)     Family History  Problem Relation Age of Onset  . Coronary artery disease Father   . Diabetes Father    History  Substance Use Topics  . Smoking status: Current Every Day Smoker -- 1.00 packs/day  . Smokeless tobacco: Not on file     Comment: quit 2011  . Alcohol Use: Yes     Comment: occasional    Review of Systems ROS: Statement: All systems negative except as marked or noted in the HPI; Constitutional: Negative for fever and chills. ; ; Eyes: Negative for eye pain, redness and discharge. ; ; ENMT: Negative for ear pain, hoarseness, nasal congestion, sinus pressure and sore throat. ; ; Cardiovascular: Negative for chest pain, palpitations, diaphoresis, dyspnea and +peripheral edema. ; ; Respiratory: Negative for cough, wheezing and stridor. ; ; Gastrointestinal: Negative for nausea, vomiting, diarrhea, abdominal pain, blood in  stool, hematemesis, jaundice and rectal bleeding. . ; ; Genitourinary: Negative for dysuria, flank pain and hematuria. ; ; Musculoskeletal: Negative for back pain and neck pain. Negative for swelling and trauma.; ; Skin: +rash. Negative for pruritus, abrasions, blisters, bruising and skin lesion.; ; Neuro: Negative for headache, lightheadedness and neck stiffness. Negative for weakness, altered level of consciousness , altered mental status, extremity weakness, paresthesias, involuntary movement, seizure and syncope.      Allergies  Sulfonamide derivatives  Home Medications   Prior to Admission medications   Medication Sig Start Date End Date Taking? Authorizing Provider  albuterol (PROAIR HFA) 108 (90 BASE) MCG/ACT inhaler Inhale into the lungs. 1-2 puffs every 4-6 hours prn     Historical Provider, MD  albuterol (VENTOLIN HFA) 108 (90 BASE) MCG/ACT inhaler Inhale into the lungs. Take 1-2 puff q 4 hours as needed     Historical Provider, MD  ALPRAZolam Duanne Moron) 1 MG tablet Take 1 mg by mouth at bedtime as needed.      Historical Provider, MD  aspirin (ASPIR-81) 81 MG EC tablet Take 81 mg by mouth daily.      Historical Provider, MD  Choline Fenofibrate (TRILIPIX) 135 MG capsule Take 135 mg by mouth daily.      Historical Provider, MD  fenofibrate 160 MG tablet Take 160 mg by mouth daily.      Historical Provider, MD  lisinopril (  PRINIVIL,ZESTRIL) 20 MG tablet Take 20 mg by mouth daily.      Historical Provider, MD  metFORMIN (GLUCOPHAGE) 500 MG tablet Take 500 mg by mouth 2 (two) times daily.      Historical Provider, MD  omega-3 acid ethyl esters (LOVAZA) 1 G capsule Take 1 g by mouth daily. Take 4 tab     Historical Provider, MD  omeprazole (PRILOSEC) 20 MG capsule Take 20 mg by mouth daily.      Historical Provider, MD  oxyCODONE (OXYCONTIN) 40 MG 12 hr tablet Take 40 mg by mouth every 8 (eight) hours.      Historical Provider, MD  oxyCODONE-acetaminophen (PERCOCET) 10-325 MG per tablet Take  1 tablet by mouth every 4 (four) hours as needed.      Historical Provider, MD  rosuvastatin (CRESTOR) 10 MG tablet Take 10 mg by mouth daily.      Historical Provider, MD  traMADol (ULTRAM) 50 MG tablet Take 50 mg by mouth every 6 (six) hours as needed. Take 2     Historical Provider, MD   BP 116/73 mmHg  Pulse 75  Temp(Src) 97.6 F (36.4 C) (Oral)  Resp 18  Ht 5\' 9"  (1.753 m)  Wt 200 lb (90.719 kg)  BMI 29.52 kg/m2  SpO2 96% Physical Exam  0810: Physical examination:  Nursing notes reviewed; Vital signs and O2 SAT reviewed;  Constitutional: Well developed, Well nourished, Well hydrated, In no acute distress; Head:  Normocephalic, atraumatic; Eyes: EOMI, PERRL, No scleral icterus; ENMT: Mouth and pharynx normal, Mucous membranes moist; Neck: Supple, Full range of motion, No lymphadenopathy; Cardiovascular: Regular rate and rhythm, No murmur, rub, or gallop; Respiratory: Breath sounds clear & equal bilaterally, No rales, rhonchi, wheezes.  Speaking full sentences with ease, Normal respiratory effort/excursion; Chest: Nontender, Movement normal; Abdomen: Soft, Nontender, Nondistended, Normal bowel sounds; Genitourinary: No CVA tenderness; Extremities: Pulses normal, +NT flat red confluent petechial rash to bilat LE's above sock lines to anterior tibial areas. No foot rash bilat. No open wounds. No blisters. No streaking. NT bilat feet/ankles/knees. No tenderness,+2 pedal edema bilat. No calf asymmetry.; Neuro: AA&Ox3, Major CN grossly intact.  Speech clear. No gross focal motor or sensory deficits in extremities. Climbs on and off stretcher easily by himself. Gait steady.; Skin: Color normal, Warm, Dry.   ED Course  Procedures     EKG Interpretation None      MDM  MDM Reviewed: previous chart, nursing note and vitals Reviewed previous: labs Interpretation: labs and ultrasound     Results for orders placed or performed during the hospital encounter of 75/10/25  Basic metabolic panel   Result Value Ref Range   Sodium 134 (L) 135 - 145 mmol/L   Potassium 4.0 3.5 - 5.1 mmol/L   Chloride 96 (L) 101 - 111 mmol/L   CO2 28 22 - 32 mmol/L   Glucose, Bld 236 (H) 65 - 99 mg/dL   BUN 17 6 - 20 mg/dL   Creatinine, Ser 0.81 0.61 - 1.24 mg/dL   Calcium 8.6 (L) 8.9 - 10.3 mg/dL   GFR calc non Af Amer >60 >60 mL/min   GFR calc Af Amer >60 >60 mL/min   Anion gap 10 5 - 15  CBC with Differential  Result Value Ref Range   WBC 4.2 4.0 - 10.5 K/uL   RBC 3.81 (L) 4.22 - 5.81 MIL/uL   Hemoglobin 12.1 (L) 13.0 - 17.0 g/dL   HCT 36.3 (L) 39.0 - 52.0 %   MCV 95.3  78.0 - 100.0 fL   MCH 31.8 26.0 - 34.0 pg   MCHC 33.3 30.0 - 36.0 g/dL   RDW 15.5 11.5 - 15.5 %   Platelets 74 (L) 150 - 400 K/uL   Neutrophils Relative % 62 43 - 77 %   Neutro Abs 2.6 1.7 - 7.7 K/uL   Lymphocytes Relative 21 12 - 46 %   Lymphs Abs 0.9 0.7 - 4.0 K/uL   Monocytes Relative 11 3 - 12 %   Monocytes Absolute 0.5 0.1 - 1.0 K/uL   Eosinophils Relative 6 (H) 0 - 5 %   Eosinophils Absolute 0.2 0.0 - 0.7 K/uL   Basophils Relative 1 0 - 1 %   Basophils Absolute 0.0 0.0 - 0.1 K/uL  Protime-INR  Result Value Ref Range   Prothrombin Time 17.3 (H) 11.6 - 15.2 seconds   INR 1.41 0.00 - 1.49  Hepatic function panel  Result Value Ref Range   Total Protein 7.2 6.5 - 8.1 g/dL   Albumin 3.6 3.5 - 5.0 g/dL   AST 47 (H) 15 - 41 U/L   ALT 27 17 - 63 U/L   Alkaline Phosphatase 125 38 - 126 U/L   Total Bilirubin 0.8 0.3 - 1.2 mg/dL   Bilirubin, Direct 0.3 0.1 - 0.5 mg/dL   Indirect Bilirubin 0.5 0.3 - 0.9 mg/dL   US Venous Img Lower Bilateral 01/14/2015   CLINICAL DATA:  Acute onset of bilateral lower extremity swelling and rash for 3 days. Initial encounter.  EXAM: BILATERAL LOWER EXTREMITY VENOUS DOPPLER ULTRASOUND  TECHNIQUE: Gray-scale sonography with graded compression, as well as color Doppler and duplex ultrasound were performed to evaluate the lower extremity deep venous systems from the level of the common femoral  vein and including the common femoral, femoral, profunda femoral, popliteal and calf veins including the posterior tibial, peroneal and gastrocnemius veins when visible. The superficial great saphenous vein was also interrogated. Spectral Doppler was utilized to evaluate flow at rest and with distal augmentation maneuvers in the common femoral, femoral and popliteal veins.  COMPARISON:  None.  FINDINGS: RIGHT LOWER EXTREMITY  Common Femoral Vein: No evidence of thrombus. Normal compressibility, respiratory phasicity and response to augmentation.  Saphenofemoral Junction: No evidence of thrombus. Normal compressibility and flow on color Doppler imaging.  Profunda Femoral Vein: No evidence of thrombus. Normal compressibility and flow on color Doppler imaging.  Femoral Vein: No evidence of thrombus. Normal compressibility, respiratory phasicity and response to augmentation.  Popliteal Vein: No evidence of thrombus. Normal compressibility, respiratory phasicity and response to augmentation.  Calf Veins: No evidence of thrombus. Normal compressibility and flow on color Doppler imaging.  Superficial Great Saphenous Vein: No evidence of thrombus. Normal compressibility and flow on color Doppler imaging.  Venous Reflux:  None.  Other Findings:  None.  LEFT LOWER EXTREMITY  Common Femoral Vein: No evidence of thrombus. Normal compressibility, respiratory phasicity and response to augmentation.  Saphenofemoral Junction: No evidence of thrombus. Normal compressibility and flow on color Doppler imaging.  Profunda Femoral Vein: No evidence of thrombus. Normal compressibility and flow on color Doppler imaging.  Femoral Vein: No evidence of thrombus. Normal compressibility, respiratory phasicity and response to augmentation.  Popliteal Vein: No evidence of thrombus. Normal compressibility, respiratory phasicity and response to augmentation.  Calf Veins: No evidence of thrombus. Normal compressibility and flow on color Doppler  imaging.  Superficial Great Saphenous Vein: No evidence of thrombus. Normal compressibility and flow on color Doppler imaging.  Venous Reflux:  None.  Other Findings:  None.  IMPRESSION: No evidence of deep venous thrombosis.   Electronically Signed   By: Garald Balding M.D.   On: 01/14/2015 09:59    1030:  Thrombocytopenia on labs, no old to compare. Bilat lower legs rash began after being outside in the yard and endorses several tick bites recently; will tx vasculitis with prednisone, will also tx with doxycycline. No clear indications for admission at this time, VSS/afebrile; pt will need further Rheum workup by PMD. Pt states he already has an appt schedule for this week with his PMD. States he wants to go home now. Dx and testing d/w pt and family.  Questions answered.  Verb understanding, agreeable to d/c home with outpt f/u.   Francine Graven, DO 01/17/15 [phone]

## 2015-01-14 NOTE — ED Notes (Signed)
Pt reports lower leg swelling,rash x3 days. Pt reports contacted PCP and was prescribed HCTZ. Pt reports "swelling has improved". nad noted. Moderate swelling noted to BLE. Pt denies any cp,sob.

## 2016-08-22 ENCOUNTER — Inpatient Hospital Stay (HOSPITAL_COMMUNITY)
Admission: EM | Admit: 2016-08-22 | Discharge: 2016-08-27 | DRG: 603 | Disposition: A | Discharge status: Home / Self Care | Payer: Medicare Other | Attending: Internal Medicine | Admitting: Internal Medicine

## 2016-08-22 ENCOUNTER — Encounter (HOSPITAL_COMMUNITY): Payer: Self-pay | Admitting: Emergency Medicine

## 2016-08-22 DIAGNOSIS — Z79891 Long term (current) use of opiate analgesic: Secondary | ICD-10-CM | POA: Diagnosis not present

## 2016-08-22 DIAGNOSIS — E876 Hypokalemia: Secondary | ICD-10-CM | POA: Diagnosis not present

## 2016-08-22 DIAGNOSIS — Z882 Allergy status to sulfonamides status: Secondary | ICD-10-CM | POA: Diagnosis not present

## 2016-08-22 DIAGNOSIS — R21 Rash and other nonspecific skin eruption: Secondary | ICD-10-CM | POA: Diagnosis present

## 2016-08-22 DIAGNOSIS — F17200 Nicotine dependence, unspecified, uncomplicated: Secondary | ICD-10-CM | POA: Diagnosis not present

## 2016-08-22 DIAGNOSIS — Z7982 Long term (current) use of aspirin: Secondary | ICD-10-CM

## 2016-08-22 DIAGNOSIS — R600 Localized edema: Secondary | ICD-10-CM

## 2016-08-22 DIAGNOSIS — M549 Dorsalgia, unspecified: Secondary | ICD-10-CM | POA: Diagnosis present

## 2016-08-22 DIAGNOSIS — F329 Major depressive disorder, single episode, unspecified: Secondary | ICD-10-CM | POA: Diagnosis present

## 2016-08-22 DIAGNOSIS — D72819 Decreased white blood cell count, unspecified: Secondary | ICD-10-CM | POA: Diagnosis present

## 2016-08-22 DIAGNOSIS — Z885 Allergy status to narcotic agent status: Secondary | ICD-10-CM | POA: Diagnosis not present

## 2016-08-22 DIAGNOSIS — Z7984 Long term (current) use of oral hypoglycemic drugs: Secondary | ICD-10-CM

## 2016-08-22 DIAGNOSIS — Z8249 Family history of ischemic heart disease and other diseases of the circulatory system: Secondary | ICD-10-CM

## 2016-08-22 DIAGNOSIS — D696 Thrombocytopenia, unspecified: Secondary | ICD-10-CM | POA: Diagnosis present

## 2016-08-22 DIAGNOSIS — Z794 Long term (current) use of insulin: Secondary | ICD-10-CM | POA: Diagnosis not present

## 2016-08-22 DIAGNOSIS — I10 Essential (primary) hypertension: Secondary | ICD-10-CM | POA: Diagnosis present

## 2016-08-22 DIAGNOSIS — Z833 Family history of diabetes mellitus: Secondary | ICD-10-CM | POA: Diagnosis not present

## 2016-08-22 DIAGNOSIS — Z96659 Presence of unspecified artificial knee joint: Secondary | ICD-10-CM | POA: Diagnosis present

## 2016-08-22 DIAGNOSIS — G8921 Chronic pain due to trauma: Secondary | ICD-10-CM | POA: Diagnosis present

## 2016-08-22 DIAGNOSIS — E119 Type 2 diabetes mellitus without complications: Secondary | ICD-10-CM

## 2016-08-22 DIAGNOSIS — F17210 Nicotine dependence, cigarettes, uncomplicated: Secondary | ICD-10-CM | POA: Diagnosis present

## 2016-08-22 DIAGNOSIS — F419 Anxiety disorder, unspecified: Secondary | ICD-10-CM | POA: Diagnosis present

## 2016-08-22 DIAGNOSIS — L03115 Cellulitis of right lower limb: Secondary | ICD-10-CM | POA: Diagnosis present

## 2016-08-22 DIAGNOSIS — E785 Hyperlipidemia, unspecified: Secondary | ICD-10-CM | POA: Diagnosis present

## 2016-08-22 DIAGNOSIS — D708 Other neutropenia: Secondary | ICD-10-CM | POA: Diagnosis not present

## 2016-08-22 DIAGNOSIS — E871 Hypo-osmolality and hyponatremia: Secondary | ICD-10-CM | POA: Diagnosis present

## 2016-08-22 LAB — COMPREHENSIVE METABOLIC PANEL
ALK PHOS: 139 U/L — AB (ref 38–126)
ALT: 26 U/L (ref 17–63)
ANION GAP: 11 (ref 5–15)
AST: 62 U/L — ABNORMAL HIGH (ref 15–41)
Albumin: 3.2 g/dL — ABNORMAL LOW (ref 3.5–5.0)
BILIRUBIN TOTAL: 1.6 mg/dL — AB (ref 0.3–1.2)
BUN: 8 mg/dL (ref 6–20)
CALCIUM: 8.5 mg/dL — AB (ref 8.9–10.3)
CO2: 26 mmol/L (ref 22–32)
Chloride: 97 mmol/L — ABNORMAL LOW (ref 101–111)
Creatinine, Ser: 0.8 mg/dL (ref 0.61–1.24)
GFR calc non Af Amer: >60 mL/min (ref >60)
Glucose, Bld: 175 mg/dL — ABNORMAL HIGH (ref 65–99)
Potassium: 2.9 mmol/L — ABNORMAL LOW (ref 3.5–5.1)
Sodium: 134 mmol/L — ABNORMAL LOW (ref 135–145)
TOTAL PROTEIN: 7.3 g/dL (ref 6.5–8.1)

## 2016-08-22 LAB — CBC WITH DIFFERENTIAL/PLATELET
Basophils Absolute: 0 10*3/uL (ref 0.0–0.1)
Basophils Relative: 0 %
EOS ABS: 0.1 10*3/uL (ref 0.0–0.7)
Eosinophils Relative: 2 %
HEMATOCRIT: 33.2 % — AB (ref 39.0–52.0)
HEMOGLOBIN: 11.5 g/dL — AB (ref 13.0–17.0)
LYMPHS ABS: 0.6 10*3/uL — AB (ref 0.7–4.0)
Lymphocytes Relative: 23 %
MCH: 32.7 pg (ref 26.0–34.0)
MCHC: 34.6 g/dL (ref 30.0–36.0)
MCV: 94.3 fL (ref 78.0–100.0)
MONOS PCT: 14 %
Monocytes Absolute: 0.4 10*3/uL (ref 0.1–1.0)
NEUTROS ABS: 1.7 10*3/uL (ref 1.7–7.7)
NEUTROS PCT: 62 %
Platelets: 68 10*3/uL — ABNORMAL LOW (ref 150–400)
RBC: 3.52 MIL/uL — ABNORMAL LOW (ref 4.22–5.81)
RDW: 13.7 % (ref 11.5–15.5)
WBC: 2.7 10*3/uL — ABNORMAL LOW (ref 4.0–10.5)

## 2016-08-22 LAB — GLUCOSE, CAPILLARY: Glucose-Capillary: 101 mg/dL — ABNORMAL HIGH (ref 65–99)

## 2016-08-22 MED ORDER — OXYCODONE HCL 5 MG PO TABS
30.0000 mg | ORAL_TABLET | Freq: Four times a day (QID) | ORAL | Status: DC | PRN
  Administered 2016-08-23 – 2016-08-27 (×13): 30 mg via ORAL
  Filled 2016-08-22 (×14): qty 6

## 2016-08-22 MED ORDER — ACETAMINOPHEN 650 MG RE SUPP: 650.0000 mg | Freq: Four times a day (QID) | RECTAL | Status: DC | PRN

## 2016-08-22 MED ORDER — ASPIRIN EC 81 MG PO TBEC
81.0000 mg | DELAYED_RELEASE_TABLET | Freq: Every day | ORAL | Status: DC
  Administered 2016-08-23 – 2016-08-27 (×5): 81 mg via ORAL
  Filled 2016-08-22 (×5): qty 1

## 2016-08-22 MED ORDER — INSULIN ASPART 100 UNIT/ML ~~LOC~~ SOLN
0.0000 [IU] | Freq: Three times a day (TID) | SUBCUTANEOUS | Status: DC
  Administered 2016-08-23: 1 [IU] via SUBCUTANEOUS
  Administered 2016-08-25: 2 [IU] via SUBCUTANEOUS
  Administered 2016-08-25: 1 [IU] via SUBCUTANEOUS
  Administered 2016-08-26: 2 [IU] via SUBCUTANEOUS
  Administered 2016-08-26 – 2016-08-27 (×3): 1 [IU] via SUBCUTANEOUS

## 2016-08-22 MED ORDER — ONDANSETRON HCL 4 MG PO TABS: 4.0000 mg | ORAL_TABLET | Freq: Four times a day (QID) | ORAL | Status: DC | PRN

## 2016-08-22 MED ORDER — OXYCODONE-ACETAMINOPHEN 5-325 MG PO TABS
1.0000 | ORAL_TABLET | Freq: Once | ORAL | Status: AC
  Administered 2016-08-22: 1 via ORAL
  Filled 2016-08-22: qty 1

## 2016-08-22 MED ORDER — PANTOPRAZOLE SODIUM 40 MG PO TBEC
40.0000 mg | DELAYED_RELEASE_TABLET | Freq: Every day | ORAL | Status: DC
  Administered 2016-08-23 – 2016-08-27 (×5): 40 mg via ORAL
  Filled 2016-08-22 (×5): qty 1

## 2016-08-22 MED ORDER — INSULIN LISPRO PROT & LISPRO (75-25 MIX) 100 UNIT/ML KWIKPEN: 15.0000 [IU] | PEN_INJECTOR | Freq: Two times a day (BID) | SUBCUTANEOUS | Status: DC

## 2016-08-22 MED ORDER — METFORMIN HCL 500 MG PO TABS
500.0000 mg | ORAL_TABLET | Freq: Two times a day (BID) | ORAL | Status: DC
  Administered 2016-08-23 – 2016-08-27 (×9): 500 mg via ORAL
  Filled 2016-08-22 (×9): qty 1

## 2016-08-22 MED ORDER — ONDANSETRON HCL 4 MG/2ML IJ SOLN: 4.0000 mg | Freq: Four times a day (QID) | INTRAMUSCULAR | Status: DC | PRN

## 2016-08-22 MED ORDER — INSULIN ASPART 100 UNIT/ML ~~LOC~~ SOLN
0.0000 [IU] | Freq: Every day | SUBCUTANEOUS | Status: DC
  Administered 2016-08-25: 2 [IU] via SUBCUTANEOUS

## 2016-08-22 MED ORDER — ENOXAPARIN SODIUM 40 MG/0.4ML ~~LOC~~ SOLN
40.0000 mg | SUBCUTANEOUS | Status: DC
  Administered 2016-08-22 – 2016-08-23 (×2): 40 mg via SUBCUTANEOUS
  Filled 2016-08-22 (×2): qty 0.4

## 2016-08-22 MED ORDER — OXYCODONE HCL ER 20 MG PO T12A
40.0000 mg | EXTENDED_RELEASE_TABLET | Freq: Two times a day (BID) | ORAL | Status: DC
  Administered 2016-08-22 – 2016-08-27 (×10): 40 mg via ORAL
  Filled 2016-08-22 (×10): qty 2

## 2016-08-22 MED ORDER — ROSUVASTATIN CALCIUM 10 MG PO TABS
10.0000 mg | ORAL_TABLET | Freq: Every day | ORAL | Status: DC
  Administered 2016-08-23 – 2016-08-26 (×4): 10 mg via ORAL
  Filled 2016-08-22 (×4): qty 1

## 2016-08-22 MED ORDER — VANCOMYCIN HCL IN DEXTROSE 1-5 GM/200ML-% IV SOLN
1000.0000 mg | Freq: Three times a day (TID) | INTRAVENOUS | Status: DC
  Administered 2016-08-22 – 2016-08-26 (×13): 1000 mg via INTRAVENOUS
  Filled 2016-08-22 (×17): qty 200

## 2016-08-22 MED ORDER — VANCOMYCIN HCL IN DEXTROSE 1-5 GM/200ML-% IV SOLN
1000.0000 mg | Freq: Once | INTRAVENOUS | Status: AC
  Administered 2016-08-22: 1000 mg via INTRAVENOUS
  Filled 2016-08-22: qty 200

## 2016-08-22 MED ORDER — ACETAMINOPHEN 325 MG PO TABS: 650.0000 mg | ORAL_TABLET | Freq: Four times a day (QID) | ORAL | Status: DC | PRN

## 2016-08-22 MED ORDER — HYDROCODONE-ACETAMINOPHEN 5-325 MG PO TABS
1.0000 | ORAL_TABLET | Freq: Once | ORAL | Status: DC
  Filled 2016-08-22: qty 1

## 2016-08-22 MED ORDER — POTASSIUM CHLORIDE CRYS ER 20 MEQ PO TBCR
40.0000 meq | EXTENDED_RELEASE_TABLET | Freq: Once | ORAL | Status: AC
  Administered 2016-08-22: 40 meq via ORAL
  Filled 2016-08-22: qty 2

## 2016-08-22 MED ORDER — INSULIN ASPART PROT & ASPART (70-30 MIX) 100 UNIT/ML ~~LOC~~ SUSP
15.0000 [IU] | Freq: Two times a day (BID) | SUBCUTANEOUS | Status: DC
  Administered 2016-08-23: 15 [IU] via SUBCUTANEOUS
  Filled 2016-08-22: qty 10

## 2016-08-22 MED ORDER — FENOFIBRATE 160 MG PO TABS
160.0000 mg | ORAL_TABLET | Freq: Every day | ORAL | Status: DC
  Administered 2016-08-23 – 2016-08-27 (×5): 160 mg via ORAL
  Filled 2016-08-22 (×6): qty 1

## 2016-08-22 MED ORDER — HYDROCHLOROTHIAZIDE 25 MG PO TABS
25.0000 mg | ORAL_TABLET | Freq: Every day | ORAL | Status: DC
  Administered 2016-08-23 – 2016-08-27 (×5): 25 mg via ORAL
  Filled 2016-08-22 (×5): qty 1

## 2016-08-22 MED ORDER — ALBUTEROL SULFATE (2.5 MG/3ML) 0.083% IN NEBU: 3.0000 mL | INHALATION_SOLUTION | RESPIRATORY_TRACT | Status: DC | PRN

## 2016-08-22 MED ORDER — SODIUM CHLORIDE 0.9 % IV SOLN
INTRAVENOUS | Status: DC
  Administered 2016-08-22: 22:00:00 via INTRAVENOUS
  Administered 2016-08-23: 50 mL/h via INTRAVENOUS

## 2016-08-22 MED ORDER — ALPRAZOLAM 1 MG PO TABS
1.0000 mg | ORAL_TABLET | Freq: Four times a day (QID) | ORAL | Status: DC | PRN
  Administered 2016-08-22 – 2016-08-27 (×7): 1 mg via ORAL
  Filled 2016-08-22 (×7): qty 1
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
## 2016-08-22 NOTE — ED Triage Notes (Addendum)
Pt reports cellulitis x1 week to anterior right shin, admitted at [facility] and discharged yesterday.  Pt reports redness decreased significantly but feels as if it has gotten worse. Pt is diabetic. Pt pulse, sensation and cap refill are appropriate.

## 2016-08-22 NOTE — ED Provider Notes (Signed)
[facility] DEPT Provider Note   CSN: [id number] Arrival date & time: 08/22/16  1319     History   Chief Complaint Chief Complaint  Patient presents with  . Cellulitis    HPI Chris Alvarez is a 56 y.o. male.  Patient has a cellulitis to his right lower leg. He was admitted recently and treated with IV antibiotics. Patient has been on by mouth antibiotics for a couple days and the cellulitis is getting worse again    Rash   This is a recurrent problem. The current episode started more than 1 week ago. The problem has been gradually worsening. The problem is associated with nothing. There has been no fever. Affected Location: Right lower leg. The pain is at a severity of 6/10. The pain is moderate. The pain has been constant since onset.    Past Medical History:  Diagnosis Date  . Depressed   . DM2 (diabetes mellitus, type 2) ([place])   . HLD (hyperlipidemia)   . HTN (hypertension)     Patient Active Problem List   Diagnosis Date Noted  . Cellulitis 08/22/2016  . DM 09/17/2010  . HYPERLIPIDEMIA-MIXED 09/17/2010  . TOBACCO ABUSE 09/17/2010  . CHEST PAIN-UNSPECIFIED 09/17/2010    Past Surgical History:  Procedure Laterality Date  . knee replacement (other)         Home Medications    Prior to Admission medications   Medication Sig Start Date End Date Taking? Authorizing Provider  ALPRAZolam Duanne Moron) 1 MG tablet Take 1 mg by mouth 4 (four) times daily as needed for anxiety or sleep.    Yes Historical Provider, MD  aspirin EC 81 MG tablet Take 81 mg by mouth daily.   Yes Historical Provider, MD  clindamycin (CLEOCIN) 300 MG capsule Take 300 mg by mouth every 4 (four) hours. 10 day course 08/21/16  Yes Historical Provider, MD  doxycycline (VIBRA-TABS) 100 MG tablet Take 100 mg by mouth 2 (two) times daily. 10 day course starting on 08/21/2016 08/21/16  Yes Historical Provider, MD  fenofibrate 160 MG tablet Take 160 mg by mouth daily.     Yes Historical Provider,  MD  furosemide (LASIX) 20 MG tablet Take 20 mg by mouth daily. 07/12/16  Yes Historical Provider, MD  HUMALOG MIX 75/25 KWIKPEN (75-25) 100 UNIT/ML Kwikpen Inject 15 Units into the skin 2 (two) times daily. 07/12/16  Yes Historical Provider, MD  hydrochlorothiazide (HYDRODIURIL) 25 MG tablet Take 1 tablet by mouth daily. 01/12/15  Yes Historical Provider, MD  lisinopril (PRINIVIL,ZESTRIL) 20 MG tablet Take 20 mg by mouth daily.     Yes Historical Provider, MD  metFORMIN (GLUCOPHAGE) 500 MG tablet Take 500 mg by mouth 2 (two) times daily.     Yes Historical Provider, MD  omeprazole (PRILOSEC) 20 MG capsule Take 20 mg by mouth daily.     Yes Historical Provider, MD  oxycodone (ROXICODONE) 30 MG immediate release tablet Take 30 mg by mouth 4 (four) times daily. 08/08/16  Yes Historical Provider, MD  OXYCONTIN 40 MG 12 hr tablet Take 40 mg by mouth every 12 (twelve) hours. 08/10/16  Yes Historical Provider, MD  rosuvastatin (CRESTOR) 10 MG tablet Take 10 mg by mouth daily.     Yes Historical Provider, MD  albuterol (PROAIR HFA) 108 (90 BASE) MCG/ACT inhaler Inhale 2 puffs into the lungs every 4 (four) hours as needed for wheezing or shortness of breath. 1-2 puffs every 4-6 hours prn    Historical Provider, MD  Family History Family History  Problem Relation Age of Onset  . Coronary artery disease Father   . Diabetes Father     Social History Social History  Substance Use Topics  . Smoking status: Current Every Day Smoker    Packs/day: 1.00  . Smokeless tobacco: Not on file     Comment: quit 2011  . Alcohol use Yes     Comment: occasional     Allergies   Hydrocodone bitartrate er and Sulfonamide derivatives   Review of Systems Review of Systems  Constitutional: Negative for appetite change and fatigue.  HENT: Negative for congestion, ear discharge and sinus pressure.   Eyes: Negative for discharge.  Respiratory: Negative for cough.   Cardiovascular: Negative for chest pain.    Gastrointestinal: Negative for abdominal pain and diarrhea.  Genitourinary: Negative for frequency and hematuria.  Musculoskeletal: Negative for back pain.  Skin: Positive for rash.       Tender rash right lower leg  Neurological: Negative for seizures and headaches.  Psychiatric/Behavioral: Negative for hallucinations.     Physical Exam Updated Vital Signs BP 118/75   Pulse 72   Temp 98.4 F (36.9 C) (Oral)   Resp 20   Ht 5\' 10"  (1.778 m)   Wt 215 lb (97.5 kg)   SpO2 98%   BMI 30.85 kg/m   Physical Exam  Constitutional: He is oriented to person, place, and time. He appears well-developed.  HENT:  Head: Normocephalic.  Eyes: Conjunctivae and EOM are normal. No scleral icterus.  Neck: Neck supple. No thyromegaly present.  Cardiovascular: Normal rate and regular rhythm.  Exam reveals no gallop and no friction rub.   No murmur heard. Pulmonary/Chest: No stridor. He has no wheezes. He has no rales. He exhibits no tenderness.  Abdominal: He exhibits no distension. There is no tenderness. There is no rebound.  Musculoskeletal: Normal range of motion. He exhibits no edema.  Lymphadenopathy:    He has no cervical adenopathy.  Neurological: He is oriented to person, place, and time. He exhibits normal muscle tone. Coordination normal.  Skin: No rash noted. There is erythema.  Patient has swollen tender right lower leg with a cellulitis  Psychiatric: He has a normal mood and affect. His behavior is normal.     ED Treatments / Results  Labs (all labs ordered are listed, but only abnormal results are displayed) Labs Reviewed  CBC WITH DIFFERENTIAL/PLATELET - Abnormal; Notable for the following:       Result Value   WBC 2.7 (*)    RBC 3.52 (*)    Hemoglobin 11.5 (*)    HCT 33.2 (*)    Platelets 68 (*)    Lymphs Abs 0.6 (*)    All other components within normal limits  COMPREHENSIVE METABOLIC PANEL - Abnormal; Notable for the following:    Sodium 134 (*)    Potassium 2.9  (*)    Chloride 97 (*)    Glucose, Bld 175 (*)    Calcium 8.5 (*)    Albumin 3.2 (*)    AST 62 (*)    Alkaline Phosphatase 139 (*)    Total Bilirubin 1.6 (*)    All other components within normal limits    EKG  EKG Interpretation None       Radiology No results found.  Procedures Procedures (including critical care time)  Medications Ordered in ED Medications  HYDROcodone-acetaminophen (NORCO/VICODIN) 5-325 MG per tablet 1 tablet (1 tablet Oral Not Given 08/22/16 1816)  vancomycin (VANCOCIN)  IVPB 1000 mg/200 mL premix (0 mg Intravenous Stopped 08/22/16 1741)  potassium chloride SA (K-DUR,KLOR-CON) CR tablet 40 mEq (40 mEq Oral Given 08/22/16 1800)  oxyCODONE-acetaminophen (PERCOCET/ROXICET) 5-325 MG per tablet 1 tablet (1 tablet Oral Given 08/22/16 1837)     Initial Impression / Assessment and Plan / ED Course  I have reviewed the triage vital signs and the nursing notes.  Pertinent labs & imaging results that were available during my care of the patient were reviewed by me and considered in my medical decision making (see chart for details).    Patient will be admitted for IV antibiotics. He has failed outpatient oral antibiotics  Final Clinical Impressions(s) / ED Diagnoses   Final diagnoses:  Cellulitis of leg, right   Patient will be admitted for IV antibiotics for cellulitis New Prescriptions New Prescriptions   No medications on file     Milton Ferguson, MD 08/22/16 [phone]

## 2016-08-22 NOTE — ED Notes (Signed)
Patient requested something for pain. Dr. Roderic Palau made aware- new verbal orders given.

## 2016-08-22 NOTE — Progress Notes (Signed)
Pharmacy Antibiotic Note  Chris Alvarez is a 56 y.o. male admitted on 08/22/2016 with cellulitis.  Pharmacy has been consulted for Vancomycin dosing.  Plan: Vancomycin 1gm IV every 8 hours.  Goal trough 10-15 mcg/mL.  Monitor labs, micro and vitals.   Height: 5\' 10"  (177.8 cm) Weight: 212 lb 4.9 oz (96.3 kg) IBW/kg (Calculated) : 73  Temp (24hrs), Avg:98.4 F (36.9 C), Min:98.4 F (36.9 C), Max:98.4 F (36.9 C)   Recent Labs Lab 08/22/16 1616  WBC 2.7*  CREATININE 0.80    Estimated Creatinine Clearance: 120 mL/min (by C-G formula based on SCr of 0.8 mg/dL).    Allergies  Allergen Reactions  . Hydrocodone Bitartrate Er Hives  . Sulfonamide Derivatives Photosensitivity    Antimicrobials this admission: Vanc 1/25 >>   Dose adjustments this admission: n/a   Microbiology results:  Thank you for allowing pharmacy to be a part of this patient's care.  Pricilla Larsson 08/22/2016 9:45 PM

## 2016-08-23 ENCOUNTER — Inpatient Hospital Stay (HOSPITAL_COMMUNITY): Payer: Medicare Other

## 2016-08-23 DIAGNOSIS — D72819 Decreased white blood cell count, unspecified: Secondary | ICD-10-CM | POA: Diagnosis present

## 2016-08-23 DIAGNOSIS — D696 Thrombocytopenia, unspecified: Secondary | ICD-10-CM

## 2016-08-23 DIAGNOSIS — E876 Hypokalemia: Secondary | ICD-10-CM | POA: Diagnosis present

## 2016-08-23 HISTORY — DX: Thrombocytopenia, unspecified: D69.6

## 2016-08-23 LAB — GLUCOSE, CAPILLARY
GLUCOSE-CAPILLARY: 113 mg/dL — AB (ref 65–99)
GLUCOSE-CAPILLARY: 135 mg/dL — AB (ref 65–99)
Glucose-Capillary: 110 mg/dL — ABNORMAL HIGH (ref 65–99)
Glucose-Capillary: 83 mg/dL (ref 65–99)

## 2016-08-23 LAB — BASIC METABOLIC PANEL
Anion gap: 6 (ref 5–15)
BUN: 7 mg/dL (ref 6–20)
CO2: 27 mmol/L (ref 22–32)
Calcium: 8.2 mg/dL — ABNORMAL LOW (ref 8.9–10.3)
Chloride: 102 mmol/L (ref 101–111)
Creatinine, Ser: 0.75 mg/dL (ref 0.61–1.24)
GFR calc Af Amer: >60 mL/min (ref >60)
GLUCOSE: 106 mg/dL — AB (ref 65–99)
POTASSIUM: 3.3 mmol/L — AB (ref 3.5–5.1)
SODIUM: 135 mmol/L (ref 135–145)

## 2016-08-23 MED ORDER — INSULIN ASPART PROT & ASPART (70-30 MIX) 100 UNIT/ML ~~LOC~~ SUSP
7.0000 [IU] | Freq: Two times a day (BID) | SUBCUTANEOUS | Status: DC
  Administered 2016-08-23 – 2016-08-24 (×2): 7 [IU] via SUBCUTANEOUS
  Filled 2016-08-23: qty 10

## 2016-08-23 MED ORDER — LISINOPRIL 10 MG PO TABS
20.0000 mg | ORAL_TABLET | Freq: Every day | ORAL | Status: DC
  Administered 2016-08-23 – 2016-08-24 (×2): 20 mg via ORAL
  Filled 2016-08-23 (×2): qty 2

## 2016-08-23 MED ORDER — POTASSIUM CHLORIDE CRYS ER 20 MEQ PO TBCR
40.0000 meq | EXTENDED_RELEASE_TABLET | Freq: Once | ORAL | Status: AC
  Administered 2016-08-23: 40 meq via ORAL
  Filled 2016-08-23: qty 2

## 2016-08-23 NOTE — Progress Notes (Addendum)
PROGRESS NOTE    Chris Alvarez  [id number] DOB: 15-Nov-1960 DOA: 08/22/2016 PCP: Octavio Graves, DO    Brief Narrative:  Patient is a 56 year old man with history of DM-2, HTN, depression, and chronic pain syndrome on opiates chronically, who presented to the ED on 08/22/16 with persistent redness and pain in his right lower leg. He had just been discharged from [facility] for the same on 08/21/16-he was discharged on oral doxycycline and clindamycin. In the ED, he was afebrile and hemodynamically stable. His lab data were significant for WBC of 2.7 platelet count of 68, glucose of 175, AST of 62, and bilirubin of 1.6. He was admitted for further evaluation and management.    Assessment & Plan:   Principal Problem:   Cellulitis of leg, right Active Problems:   Type 2 diabetes mellitus with hemoglobin A1c goal of less than 7.0% (HCC)   Hypertension   Tobacco dependence   Cellulitis of right lower leg   Hypokalemia   Leukopenia   Thrombocytopenia (HCC)    1. Persistent right lower extremity cellulitis. Patient was treated at [facility] from 1/19-1/24/18. He was discharged on both clindamycin and doxycycline. He failed outpatient treatment. -Patient was started on IV vancomycin. This will be continued. -We'll order right lower extremity venous ultrasound to rule out DVT. Continue prophylactic Lovenox.  Type 2 diabetes mellitus, insulin requiring. Patient is treated chronically with Humalog 75/25, 15 units twice a day and metformin. They were continued. Will continue sliding scale NovoLog. -We'll decrease 75/25 insulin to 7 units twice a day to avoid symptomatic hypoglycemia.  Hypertension. Patient is treated chronically with hydrochlorothiazide 25 mg daily and lisinopril 20 mg daily. -Lisinopril was held on admission, but it will be restarted given relatively normal creatinine.  Leukopenia and thrombocytopenia. Etiology is unknown. He has a history of heavy  alcohol use, but has been abstinent for 1 year per his account. This could be the etiology. -We'll check a TSH and vitamin B12 level for evaluation.  Hypokalemia. Patient's low potassium could be secondary to hydrochlorothiazide and IV fluids. -We'll continue repleting his potassium orally. Will check a magnesium level to rule out deficiency.  Chronic pain syndrome. Patient has a history of chronic back pain with history of back surgery. He is treated chronically with OxyContin 40 mg every 12 hours and oxycodone 30 mg 4 times a day. They were continued.  Chronic anxiety. Patient is treated with Xanax 1 mg 4 times a day when necessary. Currently stable.    DVT prophylaxis: Lovenox Code Status: Full code Family Communication: Family not available Disposition Plan: Discharged home when clinically appropriate   Consultants:   None  Procedures:   None  Antimicrobials:   Vancomycin 1/25>>   Subjective: Patient still has pain in his right leg, but the pain medicines are helping.  Objective: Vitals:   08/22/16 1945 08/22/16 2000 08/22/16 2130 08/23/16 0638  BP:  119/71 (!) 144/83 (!) 144/82  Pulse: 70 69 72 86  Resp:   18 18  Temp:   98.6 F (37 C) 98.5 F (36.9 C)  TempSrc:   Oral Oral  SpO2: 99% 99% 99% 100%  Weight:   96.3 kg (212 lb 4.9 oz)   Height:        Intake/Output Summary (Last 24 hours) at 08/23/16 1255 Last data filed at 08/23/16 0900  Gross per 24 hour  Intake             1060 ml  Output  0 ml  Net             1060 ml   Filed Weights   08/22/16 1326 08/22/16 2130  Weight: 97.5 kg (215 lb) 96.3 kg (212 lb 4.9 oz)    Examination:  General exam: Appears calm and comfortable  Respiratory system: Clear to auscultation. Respiratory effort normal. Cardiovascular system: S1 & S2 heard, RRR. No JVD, murmurs, rubs, gallops or clicks. No pedal edema. Gastrointestinal system: Abdomen is nondistended, soft and nontender. No organomegaly or  masses felt. Normal bowel sounds heard. Central nervous system: Alert and oriented. No focal neurological deficits. Extremities: Symmetric 5 x 5 power. Skin: Right leg grossly swollen and edematous approximately 2-3+ nonpitting with moderate erythema over the pretibial surface below-the-knee; moderate tenderness; some skin peeling. Left lower extremity unremarkable. Psychiatry: Judgement and insight appear normal. Mood & affect appropriate.     Data Reviewed: I have personally reviewed following labs and imaging studies  CBC:  Recent Labs Lab 08/22/16 1616  WBC 2.7*  NEUTROABS 1.7  HGB 11.5*  HCT 33.2*  MCV 94.3  PLT 68*   Basic Metabolic Panel:  Recent Labs Lab 08/22/16 1616 08/23/16 0620  NA 134* 135  K 2.9* 3.3*  CL 97* 102  CO2 26 27  GLUCOSE 175* 106*  BUN 8 7  CREATININE 0.80 0.75  CALCIUM 8.5* 8.2*   GFR: Estimated Creatinine Clearance: 120 mL/min (by C-G formula based on SCr of 0.75 mg/dL). Liver Function Tests:  Recent Labs Lab 08/22/16 1616  AST 62*  ALT 26  ALKPHOS 139*  BILITOT 1.6*  PROT 7.3  ALBUMIN 3.2*   No results for input(s): LIPASE, AMYLASE in the last 168 hours. No results for input(s): AMMONIA in the last 168 hours. Coagulation Profile: No results for input(s): INR, PROTIME in the last 168 hours. Cardiac Enzymes: No results for input(s): CKTOTAL, CKMB, CKMBINDEX, TROPONINI in the last 168 hours. BNP (last 3 results) No results for input(s): PROBNP in the last 8760 hours. HbA1C: No results for input(s): HGBA1C in the last 72 hours. CBG:  Recent Labs Lab 08/22/16 2125 08/23/16 0732 08/23/16 1137  GLUCAP 101* 113* 135*   Lipid Profile: No results for input(s): CHOL, HDL, LDLCALC, TRIG, CHOLHDL, LDLDIRECT in the last 72 hours. Thyroid Function Tests: No results for input(s): TSH, T4TOTAL, FREET4, T3FREE, THYROIDAB in the last 72 hours. Anemia Panel: No results for input(s): VITAMINB12, FOLATE, FERRITIN, TIBC, IRON,  RETICCTPCT in the last 72 hours. Sepsis Labs: No results for input(s): PROCALCITON, LATICACIDVEN in the last 168 hours.  No results found for this or any previous visit (from the past 240 hour(s)).       Radiology Studies: No results found.      Scheduled Meds: . aspirin EC  81 mg Oral Daily  . enoxaparin (LOVENOX) injection  40 mg Subcutaneous Q24H  . fenofibrate  160 mg Oral Daily  . hydrochlorothiazide  25 mg Oral Daily  . insulin aspart  0-5 Units Subcutaneous QHS  . insulin aspart  0-9 Units Subcutaneous TID WC  . insulin aspart protamine- aspart  15 Units Subcutaneous BID WC  . metFORMIN  500 mg Oral BID WC  . oxyCODONE  40 mg Oral Q12H  . pantoprazole  40 mg Oral Daily  . rosuvastatin  10 mg Oral q1800  . vancomycin  1,000 mg Intravenous Q8H   Continuous Infusions: . sodium chloride 50 mL/hr at 08/22/16 2152     LOS: 1 day    Time spent:  81 minutes    Rexene Alberts, MD [facility] Pager [phone]  If 7PM-7AM, please contact night-coverage [email or web address] Password [facility] 08/23/2016, 12:55 PM

## 2016-08-24 LAB — CBC
HEMATOCRIT: 31.5 % — AB (ref 39.0–52.0)
HEMOGLOBIN: 11 g/dL — AB (ref 13.0–17.0)
MCH: 32.7 pg (ref 26.0–34.0)
MCHC: 34.9 g/dL (ref 30.0–36.0)
MCV: 93.8 fL (ref 78.0–100.0)
Platelets: 62 10*3/uL — ABNORMAL LOW (ref 150–400)
RBC: 3.36 MIL/uL — ABNORMAL LOW (ref 4.22–5.81)
RDW: 13.8 % (ref 11.5–15.5)
WBC: 2.5 10*3/uL — AB (ref 4.0–10.5)

## 2016-08-24 LAB — BASIC METABOLIC PANEL
Anion gap: 9 (ref 5–15)
BUN: 9 mg/dL (ref 6–20)
CALCIUM: 8 mg/dL — AB (ref 8.9–10.3)
CO2: 24 mmol/L (ref 22–32)
Chloride: 100 mmol/L — ABNORMAL LOW (ref 101–111)
Creatinine, Ser: 0.86 mg/dL (ref 0.61–1.24)
GFR calc Af Amer: >60 mL/min (ref >60)
GFR calc non Af Amer: >60 mL/min (ref >60)
GLUCOSE: 89 mg/dL (ref 65–99)
Potassium: 3.4 mmol/L — ABNORMAL LOW (ref 3.5–5.1)
Sodium: 133 mmol/L — ABNORMAL LOW (ref 135–145)

## 2016-08-24 LAB — GLUCOSE, CAPILLARY
GLUCOSE-CAPILLARY: 113 mg/dL — AB (ref 65–99)
GLUCOSE-CAPILLARY: 92 mg/dL (ref 65–99)
GLUCOSE-CAPILLARY: 100 mg/dL — AB (ref 65–99)
Glucose-Capillary: 82 mg/dL (ref 65–99)
Glucose-Capillary: 138 mg/dL — ABNORMAL HIGH (ref 65–99)

## 2016-08-24 LAB — VITAMIN B12: Vitamin B-12: 976 pg/mL — ABNORMAL HIGH (ref 180–914)

## 2016-08-24 LAB — TSH: TSH: 3.56 u[IU]/mL (ref 0.350–4.500)

## 2016-08-24 LAB — MAGNESIUM: Magnesium: 1.5 mg/dL — ABNORMAL LOW (ref 1.7–2.4)

## 2016-08-24 MED ORDER — SENNOSIDES-DOCUSATE SODIUM 8.6-50 MG PO TABS
1.0000 | ORAL_TABLET | Freq: Two times a day (BID) | ORAL | Status: DC
  Administered 2016-08-24 – 2016-08-27 (×6): 1 via ORAL
  Filled 2016-08-24 (×7): qty 1

## 2016-08-24 MED ORDER — SODIUM CHLORIDE 0.9 % IV SOLN
Freq: Two times a day (BID) | INTRAVENOUS | Status: DC | PRN
  Administered 2016-08-26: 10 mL/h via INTRAVENOUS
  Administered 2016-08-27: 02:00:00 via INTRAVENOUS

## 2016-08-24 MED ORDER — MAGNESIUM SULFATE 2 GM/50ML IV SOLN
2.0000 g | Freq: Once | INTRAVENOUS | Status: AC
  Administered 2016-08-24: 2 g via INTRAVENOUS
  Filled 2016-08-24: qty 50

## 2016-08-24 MED ORDER — POTASSIUM CHLORIDE CRYS ER 20 MEQ PO TBCR
30.0000 meq | EXTENDED_RELEASE_TABLET | Freq: Two times a day (BID) | ORAL | Status: AC
  Administered 2016-08-24 – 2016-08-25 (×4): 30 meq via ORAL
  Filled 2016-08-24 (×4): qty 1

## 2016-08-24 MED ORDER — LISINOPRIL 10 MG PO TABS
10.0000 mg | ORAL_TABLET | Freq: Every day | ORAL | Status: DC
  Administered 2016-08-25 – 2016-08-27 (×3): 10 mg via ORAL
  Filled 2016-08-24 (×3): qty 1

## 2016-08-24 MED ORDER — MAGNESIUM SULFATE 50 % IJ SOLN: 2.0000 g | Freq: Once | INTRAVENOUS | Status: DC

## 2016-08-24 NOTE — Progress Notes (Addendum)
PROGRESS NOTE    Chris Alvarez  [id number] DOB: 1961-07-05 DOA: 08/22/2016 PCP: Octavio Graves, DO    Brief Narrative:  Patient is a 56 year old man with history of DM-2, HTN, depression, and chronic pain syndrome on opiates chronically, who presented to the ED on 08/22/16 with persistent redness and pain in his right lower leg. He had just been discharged from [facility] for the same on 08/21/16-he was discharged on oral doxycycline and clindamycin. In the ED, he was afebrile and hemodynamically stable. His lab data were significant for WBC of 2.7 platelet count of 68, glucose of 175, AST of 62, and bilirubin of 1.6. He was admitted for further evaluation and management.    Assessment & Plan:   Principal Problem:   Cellulitis of leg, right Active Problems:   Type 2 diabetes mellitus with hemoglobin A1c goal of less than 7.0% (HCC)   Hypertension   Tobacco dependence   Cellulitis of right lower leg   Hypokalemia   Leukopenia   Thrombocytopenia (HCC)    1. Persistent right lower extremity cellulitis. Patient was treated at [facility] from 1/19-1/24/18. He was discharged on both clindamycin and doxycycline. He failed outpatient treatment. -Patient was started on IV vancomycin. This will be continued for now. -Right LE venous ultrasound was ordered and it was negative for DVT. -Clinically, there appears to be some improvement with a decrease in erythema and edema, but the patient will need another 24-48 hours of IV antibiotics for resolution.   Type 2 diabetes mellitus, insulin requiring. Patient is treated chronically with Humalog 75/25, 15 units twice a day and metformin. They were continued. Will continue sliding scale NovoLog. -Due to decreasing CBGs, will hold 75/35 insulin to avoid symptomatic hypoglycemia  Hypertension. Patient is treated chronically with hydrochlorothiazide 25 mg daily and lisinopril 20 mg daily. -Lisinopril was held on admission, but  was restarted.  Leukopenia and thrombocytopenia. Patient was questioned about any known history and he reported having a history of low blood counts which was attributed to his history of heavy alcohol use. He was actually referred to hematologist and eaten by his PCP and was told that his low blood counts were likely due to alcohol. He reports being alcohol free  for about 8 months.   -His TSH was within normal limits. His vitamin B12 level was not deficient.  -We will discontinue Lovenox and use SCDs for DVT prophylaxis.  Hypokalemia. Patient's low potassium could be secondary to hydrochlorothiazide and IV fluids. -Patient was started on potassium chloride orally. Magnesium level was assessed and was borderline low.  -We'll continue potassium chloride orally as needed and give 2 g of magnesium sulfate. Continue to monitor.  Chronic pain syndrome. Patient has a history of chronic back pain with history of back surgery. He is treated chronically with OxyContin 40 mg every 12 hours and oxycodone 30 mg 4 times a day. They were continued.  Chronic anxiety. Patient is treated with Xanax 1 mg 4 times a day when necessary. Currently stable.  Tobacco abuse. Patient was advised to stop smoking.    DVT prophylaxis: Lovenox>>d/c'd>>SCDs Code Status: Full code Family Communication: Family not available Disposition Plan: Discharged home when clinically appropriate   Consultants:   None  Procedures:   None  Antimicrobials:   Vancomycin 1/25>>   Subjective: Patient says he is finally seeing some improvement in the redness and swelling of his right leg, but it is still red and swollen.   Objective: Vitals:   08/22/16 2130  08/23/16 0638 08/23/16 2219 08/24/16 0602  BP: (!) 144/83 (!) 144/82 (!) 144/77 102/61  Pulse: 72 86 84 74  Resp: 18 18 20 20   Temp: 98.6 F (37 C) 98.5 F (36.9 C) 98.4 F (36.9 C) 98.6 F (37 C)  TempSrc: Oral Oral Oral Oral  SpO2: 99% 100% 100% 98%    Weight: 96.3 kg (212 lb 4.9 oz)   95.8 kg (211 lb 3.2 oz)  Height:        Intake/Output Summary (Last 24 hours) at 08/24/16 0943 Last data filed at 08/24/16 0800  Gross per 24 hour  Intake             2310 ml  Output                0 ml  Net             2310 ml   Filed Weights   08/22/16 1326 08/22/16 2130 08/24/16 0602  Weight: 97.5 kg (215 lb) 96.3 kg (212 lb 4.9 oz) 95.8 kg (211 lb 3.2 oz)    Examination:  General exam: Appears calm and comfortable  Respiratory system: Clear to auscultation. Respiratory effort normal. Cardiovascular system: S1 & S2 heard, RRR. No JVD, murmurs, rubs, gallops or clicks. No pedal edema. Gastrointestinal system: Abdomen is nondistended, soft and nontender. No organomegaly or masses felt. Normal bowel sounds heard. Central nervous system: Alert and oriented. No focal neurological deficits. Extremities: Symmetric 5 x 5 power. Skin: Right leg grossly swollen/edematous approximately-but less edematous than yesterday. Some receding of the  moderate erythema over the pretibial surface below-the-knee; moderate tenderness; some skin peeling. Left lower extremity unremarkable. Psychiatry: Judgement and insight appear normal. Mood & affect appropriate.     Data Reviewed: I have personally reviewed following labs and imaging studies  CBC:  Recent Labs Lab 08/22/16 1616 08/24/16 0629  WBC 2.7* 2.5*  NEUTROABS 1.7  --   HGB 11.5* 11.0*  HCT 33.2* 31.5*  MCV 94.3 93.8  PLT 68* 62*   Basic Metabolic Panel:  Recent Labs Lab 08/22/16 1616 08/23/16 0620 08/24/16 0629  NA 134* 135 133*  K 2.9* 3.3* 3.4*  CL 97* 102 100*  CO2 26 27 24   GLUCOSE 175* 106* 89  BUN 8 7 9   CREATININE 0.80 0.75 0.86  CALCIUM 8.5* 8.2* 8.0*  MG  --   --  1.5*   GFR: Estimated Creatinine Clearance: 111.4 mL/min (by C-G formula based on SCr of 0.86 mg/dL). Liver Function Tests:  Recent Labs Lab 08/22/16 1616  AST 62*  ALT 26  ALKPHOS 139*  BILITOT 1.6*   PROT 7.3  ALBUMIN 3.2*   No results for input(s): LIPASE, AMYLASE in the last 168 hours. No results for input(s): AMMONIA in the last 168 hours. Coagulation Profile: No results for input(s): INR, PROTIME in the last 168 hours. Cardiac Enzymes: No results for input(s): CKTOTAL, CKMB, CKMBINDEX, TROPONINI in the last 168 hours. BNP (last 3 results) No results for input(s): PROBNP in the last 8760 hours. HbA1C: No results for input(s): HGBA1C in the last 72 hours. CBG:  Recent Labs Lab 08/23/16 1137 08/23/16 1703 08/23/16 2121 08/24/16 0756 08/24/16 0924  GLUCAP 135* 110* 83 82 138*   Lipid Profile: No results for input(s): CHOL, HDL, LDLCALC, TRIG, CHOLHDL, LDLDIRECT in the last 72 hours. Thyroid Function Tests:  Recent Labs  08/24/16 0629  TSH 3.560   Anemia Panel: No results for input(s): VITAMINB12, FOLATE, FERRITIN, TIBC, IRON, RETICCTPCT in the  last 72 hours. Sepsis Labs: No results for input(s): PROCALCITON, LATICACIDVEN in the last 168 hours.  No results found for this or any previous visit (from the past 240 hour(s)).       Radiology Studies: US Venous Img Lower Unilateral Right  Result Date: 08/23/2016 CLINICAL DATA:  Redness and pain x1 week EXAM: RIGHT LOWER EXTREMITY VENOUS DOPPLER ULTRASOUND TECHNIQUE: Gray-scale sonography with compression, as well as color and duplex ultrasound, were performed to evaluate the deep venous system from the level of the common femoral vein through the popliteal and proximal calf veins. COMPARISON:  None FINDINGS: Normal compressibility of the common femoral, superficial femoral, and popliteal veins, as well as the proximal calf veins. No filling defects to suggest DVT on grayscale or color Doppler imaging. Doppler waveforms show normal direction of venous flow, normal respiratory phasicity and response to augmentation. Prominent right inguinal lymph nodes, none greater than 1 cm short axis diameter. Subcutaneous edema in the  calf and ankle. Visualized segments of the saphenous venous system normal in caliber and compressibility. Survey views of the contralateral common femoral vein are unremarkable. IMPRESSION: No evidence of  lower extremity deep vein thrombosis, right. Electronically Signed   By: Lucrezia Europe M.D.   On: 08/23/2016 15:28        Scheduled Meds: . aspirin EC  81 mg Oral Daily  . enoxaparin (LOVENOX) injection  40 mg Subcutaneous Q24H  . fenofibrate  160 mg Oral Daily  . hydrochlorothiazide  25 mg Oral Daily  . insulin aspart  0-5 Units Subcutaneous QHS  . insulin aspart  0-9 Units Subcutaneous TID WC  . insulin aspart protamine- aspart  7 Units Subcutaneous BID WC  . lisinopril  20 mg Oral Daily  . metFORMIN  500 mg Oral BID WC  . oxyCODONE  40 mg Oral Q12H  . pantoprazole  40 mg Oral Daily  . rosuvastatin  10 mg Oral q1800  . senna-docusate  1 tablet Oral BID  . vancomycin  1,000 mg Intravenous Q8H   Continuous Infusions: . sodium chloride 50 mL/hr (08/23/16 2232)     LOS: 2 days    Time spent: 58 minutes    Rexene Alberts, MD [facility] Pager [phone]  If 7PM-7AM, please contact night-coverage [email or web address] Password [facility] 08/24/2016, 9:43 AM

## 2016-08-25 DIAGNOSIS — E876 Hypokalemia: Secondary | ICD-10-CM

## 2016-08-25 DIAGNOSIS — F17200 Nicotine dependence, unspecified, uncomplicated: Secondary | ICD-10-CM

## 2016-08-25 DIAGNOSIS — D708 Other neutropenia: Secondary | ICD-10-CM

## 2016-08-25 DIAGNOSIS — E119 Type 2 diabetes mellitus without complications: Secondary | ICD-10-CM

## 2016-08-25 DIAGNOSIS — I10 Essential (primary) hypertension: Secondary | ICD-10-CM

## 2016-08-25 DIAGNOSIS — L03115 Cellulitis of right lower limb: Principal | ICD-10-CM

## 2016-08-25 DIAGNOSIS — D696 Thrombocytopenia, unspecified: Secondary | ICD-10-CM

## 2016-08-25 LAB — HEMOGLOBIN A1C
Hgb A1c MFr Bld: 5.8 % — ABNORMAL HIGH (ref 4.8–5.6)
MEAN PLASMA GLUCOSE: 120 mg/dL

## 2016-08-25 LAB — GLUCOSE, CAPILLARY
GLUCOSE-CAPILLARY: 204 mg/dL — AB (ref 65–99)
Glucose-Capillary: 96 mg/dL (ref 65–99)
Glucose-Capillary: 160 mg/dL — ABNORMAL HIGH (ref 65–99)
Glucose-Capillary: 130 mg/dL — ABNORMAL HIGH (ref 65–99)

## 2016-08-25 LAB — BASIC METABOLIC PANEL
Anion gap: 7 (ref 5–15)
BUN: 9 mg/dL (ref 6–20)
CHLORIDE: 97 mmol/L — AB (ref 101–111)
CO2: 27 mmol/L (ref 22–32)
CREATININE: 0.93 mg/dL (ref 0.61–1.24)
Calcium: 8.1 mg/dL — ABNORMAL LOW (ref 8.9–10.3)
GFR calc Af Amer: >60 mL/min (ref >60)
GFR calc non Af Amer: >60 mL/min (ref >60)
GLUCOSE: 103 mg/dL — AB (ref 65–99)
POTASSIUM: 3.9 mmol/L (ref 3.5–5.1)
Sodium: 131 mmol/L — ABNORMAL LOW (ref 135–145)

## 2016-08-25 LAB — CBC
HEMATOCRIT: 31.3 % — AB (ref 39.0–52.0)
Hemoglobin: 10.9 g/dL — ABNORMAL LOW (ref 13.0–17.0)
MCH: 32.3 pg (ref 26.0–34.0)
MCHC: 34.8 g/dL (ref 30.0–36.0)
MCV: 92.9 fL (ref 78.0–100.0)
Platelets: 51 10*3/uL — ABNORMAL LOW (ref 150–400)
RBC: 3.37 MIL/uL — ABNORMAL LOW (ref 4.22–5.81)
RDW: 13.7 % (ref 11.5–15.5)
WBC: 2.4 10*3/uL — AB (ref 4.0–10.5)

## 2016-08-25 LAB — MAGNESIUM: Magnesium: 1.7 mg/dL (ref 1.7–2.4)

## 2016-08-25 MED ORDER — MAGNESIUM SULFATE 2 GM/50ML IV SOLN
2.0000 g | Freq: Once | INTRAVENOUS | Status: AC
  Administered 2016-08-25: 2 g via INTRAVENOUS
  Filled 2016-08-25: qty 50

## 2016-08-25 NOTE — Progress Notes (Signed)
PROGRESS NOTE    Chris Alvarez  [id number] DOB: September 23, 1960 DOA: 08/22/2016 PCP: Octavio Graves, DO    Brief Narrative:  Patient is a 56 year old man with history of DM-2, HTN, depression, and chronic pain syndrome on opiates chronically, who presented to the ED on 08/22/16 with persistent redness and pain in his right lower leg. He had just been discharged from [facility] for the same on 08/21/16-he was discharged on oral doxycycline and clindamycin. In the ED, he was afebrile and hemodynamically stable. His lab data were significant for WBC of 2.7 platelet count of 68, glucose of 175, AST of 62, and bilirubin of 1.6. He was admitted for further evaluation and management.    Assessment & Plan:   Principal Problem:   Cellulitis of leg, right Active Problems:   Type 2 diabetes mellitus with hemoglobin A1c goal of less than 7.0% (HCC)   Hypertension   Tobacco dependence   Cellulitis of right lower leg   Hypokalemia   Leukopenia   Thrombocytopenia (HCC)    1. Persistent right lower extremity cellulitis. Patient was treated at [facility] from 1/19-1/24/18. He was discharged on both clindamycin and doxycycline. He failed outpatient treatment. -Patient was started on IV vancomycin. This will be continued for now. -Right LE venous ultrasound was ordered and it was negative for DVT. -Clinically, there appears to be some improvement with a decrease in erythema and edema, but the patient will need another 24-48 hours of IV antibiotics for resolution.   Type 2 diabetes mellitus, insulin requiring. Patient is treated chronically with Humalog 75/25, 15 units twice a day and metformin. They were continued. Will continue sliding scale NovoLog. -Due to decreasing CBGs, will hold 75/35 insulin to avoid symptomatic hypoglycemia. Blood sugars currently stable  Hypertension. Patient is treated chronically with hydrochlorothiazide 25 mg daily and lisinopril 20 mg  daily. -Lisinopril was held on admission, but was restarted. Blood pressures been stable  Leukopenia and thrombocytopenia. Patient was questioned about any known history and he reported having a history of low blood counts which was attributed to his history of heavy alcohol use. He was actually referred to hematologist by his PCP and was told that his low blood counts were likely due to alcohol. He reports being alcohol free  for about 8 months.   -His TSH was within normal limits. His vitamin B12 level was not deficient.  -We will discontinue Lovenox and use SCDs for DVT prophylaxis.  Hypokalemia. Patient's low potassium could be secondary to hydrochlorothiazide and IV fluids. -Patient was started on potassium chloride orally. Magnesium level was assessed and was borderline low.  -We'll continue potassium chloride orally as needed and give 2 g of magnesium sulfate. Continue to monitor.  Chronic pain syndrome. Patient has a history of chronic back pain with history of back surgery. He is treated chronically with OxyContin 40 mg every 12 hours and oxycodone 30 mg 4 times a day. They were continued.  Chronic anxiety. Patient is treated with Xanax 1 mg 4 times a day when necessary. Currently stable.  Tobacco abuse. Patient was advised to stop smoking.    DVT prophylaxis: SCDs Code Status: Full code Family Communication: Family not available Disposition Plan: Discharged home when clinically appropriate   Consultants:   None  Procedures:   None  Antimicrobials:   Vancomycin 1/25>>   Subjective: Feels the erythema and swelling is improving.  Objective: Vitals:   08/24/16 2250 08/25/16 0605 08/25/16 0855 08/25/16 1401  BP: (!) 117/93 97/70 101/61  100/65  Pulse: 73 79  93  Resp: 20 20  20   Temp: 98.7 F (37.1 C) 98.5 F (36.9 C)  98.2 F (36.8 C)  TempSrc: Oral Oral    SpO2: 96% 98%  99%  Weight:  96.4 kg (212 lb 9.6 oz)    Height:        Intake/Output Summary  (Last 24 hours) at 08/25/16 1620 Last data filed at 08/25/16 1505  Gross per 24 hour  Intake          1619.33 ml  Output              750 ml  Net           869.33 ml   Filed Weights   08/22/16 2130 08/24/16 0602 08/25/16 0605  Weight: 96.3 kg (212 lb 4.9 oz) 95.8 kg (211 lb 3.2 oz) 96.4 kg (212 lb 9.6 oz)    Examination:  General exam: Appears calm and comfortable  Respiratory system: Clear to auscultation. Respiratory effort normal. Cardiovascular system: S1 & S2 heard, RRR. No JVD, murmurs, rubs, gallops or clicks. No pedal edema. Gastrointestinal system: Abdomen is nondistended, soft and nontender. No organomegaly or masses felt. Normal bowel sounds heard. Central nervous system: Alert and oriented. No focal neurological deficits. Extremities: Symmetric 5 x 5 power. Skin: erythema noted over right lower leg, leg is edematous and tender. Psychiatry: Judgement and insight appear normal. Mood & affect appropriate.     Data Reviewed: I have personally reviewed following labs and imaging studies  CBC:  Recent Labs Lab 08/22/16 1616 08/24/16 0629 08/25/16 0602  WBC 2.7* 2.5* 2.4*  NEUTROABS 1.7  --   --   HGB 11.5* 11.0* 10.9*  HCT 33.2* 31.5* 31.3*  MCV 94.3 93.8 92.9  PLT 68* 62* 51*   Basic Metabolic Panel:  Recent Labs Lab 08/22/16 1616 08/23/16 0620 08/24/16 0629 08/25/16 0602  NA 134* 135 133* 131*  K 2.9* 3.3* 3.4* 3.9  CL 97* 102 100* 97*  CO2 26 27 24 27   GLUCOSE 175* 106* 89 103*  BUN 8 7 9 9   CREATININE 0.80 0.75 0.86 0.93  CALCIUM 8.5* 8.2* 8.0* 8.1*  MG  --   --  1.5* 1.7   GFR: Estimated Creatinine Clearance: 103.4 mL/min (by C-G formula based on SCr of 0.93 mg/dL). Liver Function Tests:  Recent Labs Lab 08/22/16 1616  AST 62*  ALT 26  ALKPHOS 139*  BILITOT 1.6*  PROT 7.3  ALBUMIN 3.2*   No results for input(s): LIPASE, AMYLASE in the last 168 hours. No results for input(s): AMMONIA in the last 168 hours. Coagulation Profile: No  results for input(s): INR, PROTIME in the last 168 hours. Cardiac Enzymes: No results for input(s): CKTOTAL, CKMB, CKMBINDEX, TROPONINI in the last 168 hours. BNP (last 3 results) No results for input(s): PROBNP in the last 8760 hours. HbA1C:  Recent Labs  08/24/16 0629  HGBA1C 5.8*   CBG:  Recent Labs Lab 08/24/16 1202 08/24/16 1632 08/24/16 2145 08/25/16 0717 08/25/16 1126  GLUCAP 113* 92 100* 96 160*   Lipid Profile: No results for input(s): CHOL, HDL, LDLCALC, TRIG, CHOLHDL, LDLDIRECT in the last 72 hours. Thyroid Function Tests:  Recent Labs  08/24/16 0629  TSH 3.560   Anemia Panel:  Recent Labs  08/24/16 0629  VITAMINB12 976*   Sepsis Labs: No results for input(s): PROCALCITON, LATICACIDVEN in the last 168 hours.  No results found for this or any previous visit (from the past  240 hour(s)).       Radiology Studies: No results found.      Scheduled Meds: . aspirin EC  81 mg Oral Daily  . fenofibrate  160 mg Oral Daily  . hydrochlorothiazide  25 mg Oral Daily  . insulin aspart  0-5 Units Subcutaneous QHS  . insulin aspart  0-9 Units Subcutaneous TID WC  . lisinopril  10 mg Oral Daily  . magnesium sulfate 1 - 4 g bolus IVPB  2 g Intravenous Once  . metFORMIN  500 mg Oral BID WC  . oxyCODONE  40 mg Oral Q12H  . pantoprazole  40 mg Oral Daily  . potassium chloride  30 mEq Oral BID  . rosuvastatin  10 mg Oral q1800  . senna-docusate  1 tablet Oral BID  . vancomycin  1,000 mg Intravenous Q8H   Continuous Infusions:    LOS: 3 days    Time spent: 30 minutes    MEMON,JEHANZEB, MD [facility] Pager [phone]  If 7PM-7AM, please contact night-coverage [email or web address] Password [email or web address] 08/25/2016, 4:20 PM

## 2016-08-26 LAB — GLUCOSE, CAPILLARY
Glucose-Capillary: 99 mg/dL (ref 65–99)
Glucose-Capillary: 126 mg/dL — ABNORMAL HIGH (ref 65–99)
Glucose-Capillary: 189 mg/dL — ABNORMAL HIGH (ref 65–99)
Glucose-Capillary: 152 mg/dL — ABNORMAL HIGH (ref 65–99)

## 2016-08-26 LAB — BASIC METABOLIC PANEL
Anion gap: 6 (ref 5–15)
BUN: 11 mg/dL (ref 6–20)
CO2: 27 mmol/L (ref 22–32)
Calcium: 8.3 mg/dL — ABNORMAL LOW (ref 8.9–10.3)
Chloride: 98 mmol/L — ABNORMAL LOW (ref 101–111)
Creatinine, Ser: 1.08 mg/dL (ref 0.61–1.24)
GFR calc Af Amer: >60 mL/min (ref >60)
GFR calc non Af Amer: >60 mL/min (ref >60)
Glucose, Bld: 109 mg/dL — ABNORMAL HIGH (ref 65–99)
Potassium: 4.3 mmol/L (ref 3.5–5.1)
Sodium: 131 mmol/L — ABNORMAL LOW (ref 135–145)

## 2016-08-26 LAB — CBC
HCT: 33.4 % — ABNORMAL LOW (ref 39.0–52.0)
Hemoglobin: 11.6 g/dL — ABNORMAL LOW (ref 13.0–17.0)
MCH: 32.3 pg (ref 26.0–34.0)
MCHC: 34.7 g/dL (ref 30.0–36.0)
MCV: 93 fL (ref 78.0–100.0)
Platelets: 51 10*3/uL — ABNORMAL LOW (ref 150–400)
RBC: 3.59 MIL/uL — ABNORMAL LOW (ref 4.22–5.81)
RDW: 13.6 % (ref 11.5–15.5)
WBC: 2.4 10*3/uL — ABNORMAL LOW (ref 4.0–10.5)

## 2016-08-26 NOTE — Progress Notes (Signed)
PROGRESS NOTE    Chris Alvarez  [id number] DOB: Aug 13, 1960 DOA: 08/22/2016 PCP: Octavio Graves, DO    Brief Narrative:  Patient is a 56 year old man with history of DM-2, HTN, depression, and chronic pain syndrome on opiates chronically, who presented to the ED on 08/22/16 with persistent redness and pain in his right lower leg. He had just been discharged from [facility] for the same on 08/21/16-he was discharged on oral doxycycline and clindamycin. In the ED, he was afebrile and hemodynamically stable. His lab data were significant for WBC of 2.7 platelet count of 68, glucose of 175, AST of 62, and bilirubin of 1.6. He was admitted for further evaluation and management.    Assessment & Plan:   Principal Problem:   Cellulitis of leg, right Active Problems:   Type 2 diabetes mellitus with hemoglobin A1c goal of less than 7.0% (HCC)   Hypertension   Tobacco dependence   Cellulitis of right lower leg   Hypokalemia   Leukopenia   Thrombocytopenia (HCC)    1. Persistent right lower extremity cellulitis. Patient was treated at [facility] from 1/19-1/24/18. He was discharged on both clindamycin and doxycycline. He failed outpatient treatment. -Patient was started on IV vancomycin. This will be continued for now. -Right LE venous ultrasound was ordered and it was negative for DVT. -Clinically, there appears to be some improvement with a decrease in erythema and edema, but the patient will need another 24 hours of IV antibiotics prior to discharge.   Type 2 diabetes mellitus, insulin requiring. Patient is treated chronically with Humalog 75/25, 15 units twice a day and metformin. They were continued. Will continue sliding scale NovoLog. -Due to decreasing CBGs, will hold 75/35 insulin to avoid symptomatic hypoglycemia. Blood sugars currently stable  Hypertension. Patient is treated chronically with hydrochlorothiazide 25 mg daily and lisinopril 20 mg  daily. -Lisinopril was held on admission, but was restarted. Blood pressures been stable  Leukopenia and thrombocytopenia. Patient was questioned about any known history and he reported having a history of low blood counts which was attributed to his history of heavy alcohol use. He was actually referred to hematologist by his PCP and was told that his low blood counts were likely due to alcohol. He reports being alcohol free  for about 8 months.   -His TSH was within normal limits. His vitamin B12 level was not deficient.  -We will discontinue Lovenox and use SCDs for DVT prophylaxis.  Hypokalemia. Patient's low potassium could be secondary to hydrochlorothiazide and IV fluids. -Patient was started on potassium chloride orally. Magnesium level was assessed and was borderline low.  -We'll continue potassium chloride orally as needed and give 2 g of magnesium sulfate. Continue to monitor.  Chronic pain syndrome. Patient has a history of chronic back pain with history of back surgery. He is treated chronically with OxyContin 40 mg every 12 hours and oxycodone 30 mg 4 times a day. They were continued.  Chronic anxiety. Patient is treated with Xanax 1 mg 4 times a day when necessary. Currently stable.  Tobacco abuse. Patient was advised to stop smoking.    DVT prophylaxis: SCDs Code Status: Full code Family Communication: Family not available Disposition Plan: Discharged home when clinically appropriate   Consultants:   None  Procedures:   None  Antimicrobials:   Vancomycin 1/25>>   Subjective: Leg is less tender and he is able to ambulate on it. Feels erythema is improving.  Objective: Vitals:   08/25/16 1401 08/25/16 2208  08/25/16 2209 08/26/16 0626  BP: 100/65 (!) 110/95 125/67 99/66  Pulse: 93 72 88 63  Resp: 20 20 20 20   Temp: 98.2 F (36.8 C) 98.9 F (37.2 C) 97.5 F (36.4 C) 98.1 F (36.7 C)  TempSrc:  Oral Oral Oral  SpO2: 99% 100% 98% 100%  Weight:     95.8 kg (211 lb 4.8 oz)  Height:        Intake/Output Summary (Last 24 hours) at 08/26/16 1455 Last data filed at 08/26/16 0900  Gross per 24 hour  Intake             1020 ml  Output              600 ml  Net              420 ml   Filed Weights   08/24/16 0602 08/25/16 0605 08/26/16 0626  Weight: 95.8 kg (211 lb 3.2 oz) 96.4 kg (212 lb 9.6 oz) 95.8 kg (211 lb 4.8 oz)    Examination:  General exam: Appears calm and comfortable  Respiratory system: Clear to auscultation. Respiratory effort normal. Cardiovascular system: S1 & S2 heard, RRR. No JVD, murmurs, rubs, gallops or clicks. No pedal edema. Gastrointestinal system: Abdomen is nondistended, soft and nontender. No organomegaly or masses felt. Normal bowel sounds heard. Central nervous system: Alert and oriented. No focal neurological deficits. Extremities: Symmetric 5 x 5 power. Skin: erythema noted over right lower leg, which is improving. Edema is present. Psychiatry: Judgement and insight appear normal. Mood & affect appropriate.     Data Reviewed: I have personally reviewed following labs and imaging studies  CBC:  Recent Labs Lab 08/22/16 1616 08/24/16 0629 08/25/16 0602 08/26/16 0810  WBC 2.7* 2.5* 2.4* 2.4*  NEUTROABS 1.7  --   --   --   HGB 11.5* 11.0* 10.9* 11.6*  HCT 33.2* 31.5* 31.3* 33.4*  MCV 94.3 93.8 92.9 93.0  PLT 68* 62* 51* 51*   Basic Metabolic Panel:  Recent Labs Lab 08/22/16 1616 08/23/16 0620 08/24/16 0629 08/25/16 0602 08/26/16 0810  NA 134* 135 133* 131* 131*  K 2.9* 3.3* 3.4* 3.9 4.3  CL 97* 102 100* 97* 98*  CO2 26 27 24 27 27   GLUCOSE 175* 106* 89 103* 109*  BUN 8 7 9 9 11   CREATININE 0.80 0.75 0.86 0.93 1.08  CALCIUM 8.5* 8.2* 8.0* 8.1* 8.3*  MG  --   --  1.5* 1.7  --    GFR: Estimated Creatinine Clearance: 88.7 mL/min (by C-G formula based on SCr of 1.08 mg/dL). Liver Function Tests:  Recent Labs Lab 08/22/16 1616  AST 62*  ALT 26  ALKPHOS 139*  BILITOT 1.6*   PROT 7.3  ALBUMIN 3.2*   No results for input(s): LIPASE, AMYLASE in the last 168 hours. No results for input(s): AMMONIA in the last 168 hours. Coagulation Profile: No results for input(s): INR, PROTIME in the last 168 hours. Cardiac Enzymes: No results for input(s): CKTOTAL, CKMB, CKMBINDEX, TROPONINI in the last 168 hours. BNP (last 3 results) No results for input(s): PROBNP in the last 8760 hours. HbA1C:  Recent Labs  08/24/16 0629  HGBA1C 5.8*   CBG:  Recent Labs Lab 08/25/16 1126 08/25/16 1648 08/25/16 2141 08/26/16 0731 08/26/16 1126  GLUCAP 160* 130* 204* 99 126*   Lipid Profile: No results for input(s): CHOL, HDL, LDLCALC, TRIG, CHOLHDL, LDLDIRECT in the last 72 hours. Thyroid Function Tests:  Recent Labs  08/24/16 [phone]  TSH 3.560   Anemia Panel:  Recent Labs  08/24/16 0629  VITAMINB12 976*   Sepsis Labs: No results for input(s): PROCALCITON, LATICACIDVEN in the last 168 hours.  No results found for this or any previous visit (from the past 240 hour(s)).       Radiology Studies: No results found.      Scheduled Meds: . aspirin EC  81 mg Oral Daily  . fenofibrate  160 mg Oral Daily  . hydrochlorothiazide  25 mg Oral Daily  . insulin aspart  0-5 Units Subcutaneous QHS  . insulin aspart  0-9 Units Subcutaneous TID WC  . lisinopril  10 mg Oral Daily  . metFORMIN  500 mg Oral BID WC  . oxyCODONE  40 mg Oral Q12H  . pantoprazole  40 mg Oral Daily  . rosuvastatin  10 mg Oral q1800  . senna-docusate  1 tablet Oral BID  . vancomycin  1,000 mg Intravenous Q8H   Continuous Infusions:    LOS: 4 days    Time spent: 25 minutes    Dow Blahnik, MD [facility] Pager [phone]  If 7PM-7AM, please contact night-coverage [email or web address] Password [email or web address] 08/26/2016, 2:55 PM

## 2016-08-27 LAB — BASIC METABOLIC PANEL
ANION GAP: 6 (ref 5–15)
BUN: 14 mg/dL (ref 6–20)
CALCIUM: 8.1 mg/dL — AB (ref 8.9–10.3)
CO2: 26 mmol/L (ref 22–32)
Chloride: 96 mmol/L — ABNORMAL LOW (ref 101–111)
Creatinine, Ser: 1.24 mg/dL (ref 0.61–1.24)
GLUCOSE: 129 mg/dL — AB (ref 65–99)
Potassium: 4.2 mmol/L (ref 3.5–5.1)
SODIUM: 128 mmol/L — AB (ref 135–145)

## 2016-08-27 LAB — GLUCOSE, CAPILLARY
Glucose-Capillary: 124 mg/dL — ABNORMAL HIGH (ref 65–99)
Glucose-Capillary: 130 mg/dL — ABNORMAL HIGH (ref 65–99)

## 2016-08-27 LAB — VANCOMYCIN, TROUGH: VANCOMYCIN TR: 30 ug/mL — AB (ref 15–20)

## 2016-08-27 MED ORDER — OXYCODONE-ACETAMINOPHEN 5-325 MG PO TABS: 1.0000 | ORAL_TABLET | Freq: Four times a day (QID) | ORAL | 0 refills | Status: DC | PRN

## 2016-08-27 MED ORDER — DOXYCYCLINE HYCLATE 100 MG PO TABS: 100.0000 mg | ORAL_TABLET | Freq: Two times a day (BID) | ORAL | 0 refills | Status: DC

## 2016-08-27 MED ORDER — VANCOMYCIN HCL IN DEXTROSE 1-5 GM/200ML-% IV SOLN
1000.0000 mg | Freq: Two times a day (BID) | INTRAVENOUS | Status: DC
  Filled 2016-08-27 (×2): qty 200

## 2016-08-27 NOTE — Progress Notes (Signed)
Patient with orders to be discharge home. Discharge instructions given. Prescriptions given. Patient stable. Patient left in private vehicle with son.

## 2016-08-27 NOTE — Progress Notes (Signed)
Pharmacy Antibiotic Note  Chris Alvarez is a 56 y.o. male admitted on 08/22/2016 with cellulitis.  Pharmacy has been consulted for Vancomycin dosing. Vancomycin trough 36mcg/ml this AM. Adjust dose Estimated PK parameters:  ke= 0.06hr; t 1/2= 11.5 hrs; Vd= 67 L. Clinically, there appears to be some improvement with a decrease in erythema and edema.  Plan:  Change Vancomycin 1gm IV every 12 hours.  Goal trough 10-15 mcg/mL. Monitor labs, micro and vitals.   Height: 5\' 10"  (177.8 cm) Weight: 212 lb 9.6 oz (96.4 kg) IBW/kg (Calculated) : 73  Temp (24hrs), Avg:98 F (36.7 C), Min:97.9 F (36.6 C), Max:98 F (36.7 C)   Recent Labs Lab 08/22/16 1616 08/23/16 0620 08/24/16 0629 08/25/16 0602 08/26/16 0810 08/27/16 0643 08/27/16 0645  WBC 2.7*  --  2.5* 2.4* 2.4*  --   --   CREATININE 0.80 0.75 0.86 0.93 1.08 1.24  --   VANCOTROUGH  --   --   --   --   --   --  30*    Estimated Creatinine Clearance: 77.5 mL/min (by C-G formula based on SCr of 1.24 mg/dL).    Allergies  Allergen Reactions  . Hydrocodone Bitartrate Er Hives  . Sulfonamide Derivatives Photosensitivity    Antimicrobials this admission: Vanc 1/25 >>   Dose adjustments this admission: 1/30 Change Vancomycin to q12h  Microbiology results:  Thank you for allowing pharmacy to be a part of this patient's care.  Isac Sarna, BS Pharm D, BCPS Clinical Pharmacist Pager [phone] 08/27/2016 9:08 AM

## 2016-08-27 NOTE — Discharge Summary (Signed)
Physician Discharge Summary  Chris Alvarez [id number] DOB: May 04, 1961 DOA: 08/22/2016  PCP: Octavio Graves, DO  Admit date: 08/22/2016 Discharge date: 08/27/2016  Admitted From: Home Disposition: Home  Recommendations for Outpatient Follow-up:  1. Follow up with PCP in 1-2 weeks 2. Please obtain BMP/CBC in one week   Home Health: Equipment/Devices:  Discharge Condition:Stable CODE STATUS:Full code Diet recommendation: Heart Healthy / Carb Modified   Brief/Interim Summary: Patient is a 56 year old man with history of DM-2, HTN, depression, and chronic pain syndrome on opiates chronically, who presented to the ED on 08/22/16 with persistent redness and pain in his right lower leg. He had just been discharged from [facility] for the same on 08/21/16-he was discharged on oral doxycycline and clindamycin. In the ED, he was afebrile and hemodynamically stable. His lab data were significant for WBC of 2.7 platelet count of 68, glucose of 175, AST of 62, and bilirubin of 1.6. He was admitted for further evaluation and management.  Discharge Diagnoses:  Principal Problem:   Cellulitis of leg, right Active Problems:   Type 2 diabetes mellitus with hemoglobin A1c goal of less than 7.0% (HCC)   Hypertension   Tobacco dependence   Cellulitis of right lower leg   Hypokalemia   Leukopenia   Thrombocytopenia (HCC)  1. Persistent right lower extremity cellulitis. Patient was treated at [facility] from 1/19-1/24/18. He was discharged on both clindamycin and doxycycline. He failed outpatient treatment. -Patient was started on IV vancomycin. This will be continued for now. -Right LE venous ultrasound was ordered and it was negative for DVT. -After several days of intravenous vancomycin, the cellulitis has significantly improved. Since he has a sulfa allergy, we'll not be able to prescribe Bactrim on discharge. We'll give him a course of doxycycline.   Type 2 diabetes mellitus,  insulin requiring. Patient is treated chronically with Humalog 75/25, 15 units twice a day and metformin. They were continued. Will continue sliding scale NovoLog.  Hypertension. Patient is treated chronically with hydrochlorothiazide 25 mg daily and lisinopril 20 mg daily. -Lisinopril was held on admission, but was restarted. Blood pressures been stable -Hydrochlorothiazide will be held on discharge since patient has developed hyponatremia. He is otherwise asymptomatic with hyponatremia.  Leukopenia and thrombocytopenia. Patient was questioned about any known history and he reported having a history of low blood counts which was attributed to his history of heavy alcohol use. He was actually referred to hematologist by his PCP and was told that his low blood counts were likely due to alcohol. He reports being alcohol free  for about 8 months.   -His TSH was within normal limits. His vitamin B12 level was not deficient.  -We will discontinue Lovenox and use SCDs for DVT prophylaxis.  Hypokalemia. Patient's low potassium could be secondary to hydrochlorothiazide and IV fluids. -Patient was started on potassium chloride orally. Magnesium level was assessed and was borderline low.  -We'll continue potassium chloride orally as needed and give 2 g of magnesium sulfate. Continue to monitor.  Chronic pain syndrome. Patient has a history of chronic back pain with history of back surgery. He is treated chronically with OxyContin 40 mg every 12 hours and oxycodone 30 mg 4 times a day. They were continued.  Chronic anxiety. Patient is treated with Xanax 1 mg 4 times a day when necessary. Currently stable.  Tobacco abuse. Patient was advised to stop smoking.  Discharge Instructions  Discharge Instructions    Diet - low sodium heart healthy  Complete by:  As directed    Increase activity slowly    Complete by:  As directed      Allergies as of 08/27/2016      Reactions   Hydrocodone  Bitartrate Er Hives   Sulfonamide Derivatives Photosensitivity      Medication List    STOP taking these medications   clindamycin 300 MG capsule Commonly known as:  CLEOCIN   hydrochlorothiazide 25 MG tablet Commonly known as:  HYDRODIURIL     TAKE these medications   aspirin EC 81 MG tablet Take 81 mg by mouth daily.   CRESTOR 10 MG tablet Generic drug:  rosuvastatin Take 10 mg by mouth daily.   doxycycline 100 MG tablet Commonly known as:  VIBRA-TABS Take 1 tablet (100 mg total) by mouth 2 (two) times daily. 10 day course starting on 08/21/2016   fenofibrate 160 MG tablet Take 160 mg by mouth daily.   furosemide 20 MG tablet Commonly known as:  LASIX Take 20 mg by mouth daily.   HUMALOG MIX 75/25 KWIKPEN (75-25) 100 UNIT/ML Kwikpen Generic drug:  Insulin Lispro Prot & Lispro Inject 15 Units into the skin 2 (two) times daily.   lisinopril 20 MG tablet Commonly known as:  PRINIVIL,ZESTRIL Take 20 mg by mouth daily.   metFORMIN 500 MG tablet Commonly known as:  GLUCOPHAGE Take 500 mg by mouth 2 (two) times daily.   omeprazole 20 MG capsule Commonly known as:  PRILOSEC Take 20 mg by mouth daily.   oxyCODONE-acetaminophen 5-325 MG tablet Commonly known as:  ROXICET Take 1 tablet by mouth every 6 (six) hours as needed for severe pain.   OXYCONTIN 40 mg 12 hr tablet Generic drug:  oxyCODONE Take 40 mg by mouth every 12 (twelve) hours. What changed:  Another medication with the same name was removed. Continue taking this medication, and follow the directions you see here.   PROAIR HFA 108 (90 Base) MCG/ACT inhaler Generic drug:  albuterol Inhale 2 puffs into the lungs every 4 (four) hours as needed for wheezing or shortness of breath. 1-2 puffs every 4-6 hours prn   XANAX 1 MG tablet Generic drug:  ALPRAZolam Take 1 mg by mouth 4 (four) times daily as needed for anxiety or sleep.      Follow-up Information    CYNTHIA BUTLER, DO. Schedule an appointment  as soon as possible for a visit in 2 week(s).   Contact information: [address] [place] [address] [phone]          Allergies  Allergen Reactions  . Hydrocodone Bitartrate Er Hives  . Sulfonamide Derivatives Photosensitivity    Consultations:     Procedures/Studies: US Venous Img Lower Unilateral Right  Result Date: 08/23/2016 CLINICAL DATA:  Redness and pain x1 week EXAM: RIGHT LOWER EXTREMITY VENOUS DOPPLER ULTRASOUND TECHNIQUE: Gray-scale sonography with compression, as well as color and duplex ultrasound, were performed to evaluate the deep venous system from the level of the common femoral vein through the popliteal and proximal calf veins. COMPARISON:  None FINDINGS: Normal compressibility of the common femoral, superficial femoral, and popliteal veins, as well as the proximal calf veins. No filling defects to suggest DVT on grayscale or color Doppler imaging. Doppler waveforms show normal direction of venous flow, normal respiratory phasicity and response to augmentation. Prominent right inguinal lymph nodes, none greater than 1 cm short axis diameter. Subcutaneous edema in the calf and ankle. Visualized segments of the saphenous venous system normal in caliber  and compressibility. Survey views of the contralateral common femoral vein are unremarkable. IMPRESSION: No evidence of  lower extremity deep vein thrombosis, right. Electronically Signed   By: Lucrezia Europe M.D.   On: 08/23/2016 15:28       Subjective: Feels that pain and swelling in lower extremity is improving  Discharge Exam: Vitals:   08/26/16 2128 08/27/16 0455  BP: 105/65 (!) 90/57  Pulse: 67 65  Resp: (!) 21 18  Temp: 98 F (36.7 C) 97.9 F (36.6 C)   Vitals:   08/25/16 2209 08/26/16 0626 08/26/16 2128 08/27/16 0455  BP: 125/67 99/66 105/65 (!) 90/57  Pulse: 88 63 67 65  Resp: 20 20 (!) 21 18  Temp: 97.5 F (36.4 C) 98.1 F (36.7 C) 98 F (36.7 C) 97.9 F (36.6 C)  TempSrc: Oral  Oral Oral Oral  SpO2: 98% 100% 99% 100%  Weight:  95.8 kg (211 lb 4.8 oz)  96.4 kg (212 lb 9.6 oz)  Height:        General: Pt is alert, awake, not in acute distress Cardiovascular: RRR, S1/S2 +, no rubs, no gallops Respiratory: CTA bilaterally, no wheezing, no rhonchi Abdominal: Soft, NT, ND, bowel sounds + Extremities: Improving erythema in right lower extremity. Edema persists, slowly improving    The results of significant diagnostics from this hospitalization (including imaging, microbiology, ancillary and laboratory) are listed below for reference.     Microbiology: No results found for this or any previous visit (from the past 240 hour(s)).   Labs: BNP (last 3 results) No results for input(s): BNP in the last 8760 hours. Basic Metabolic Panel:  Recent Labs Lab 08/23/16 0620 08/24/16 0629 08/25/16 0602 08/26/16 0810 08/27/16 0643  NA 135 133* 131* 131* 128*  K 3.3* 3.4* 3.9 4.3 4.2  CL 102 100* 97* 98* 96*  CO2 27 24 27 27 26   GLUCOSE 106* 89 103* 109* 129*  BUN 7 9 9 11 14   CREATININE 0.75 0.86 0.93 1.08 1.24  CALCIUM 8.2* 8.0* 8.1* 8.3* 8.1*  MG  --  1.5* 1.7  --   --    Liver Function Tests:  Recent Labs Lab 08/22/16 1616  AST 62*  ALT 26  ALKPHOS 139*  BILITOT 1.6*  PROT 7.3  ALBUMIN 3.2*   No results for input(s): LIPASE, AMYLASE in the last 168 hours. No results for input(s): AMMONIA in the last 168 hours. CBC:  Recent Labs Lab 08/22/16 1616 08/24/16 0629 08/25/16 0602 08/26/16 0810  WBC 2.7* 2.5* 2.4* 2.4*  NEUTROABS 1.7  --   --   --   HGB 11.5* 11.0* 10.9* 11.6*  HCT 33.2* 31.5* 31.3* 33.4*  MCV 94.3 93.8 92.9 93.0  PLT 68* 62* 51* 51*   Cardiac Enzymes: No results for input(s): CKTOTAL, CKMB, CKMBINDEX, TROPONINI in the last 168 hours. BNP: Invalid input(s): POCBNP CBG:  Recent Labs Lab 08/26/16 1126 08/26/16 1642 08/26/16 2132 08/27/16 0752 08/27/16 1124  GLUCAP 126* 189* 152* 124* 130*   D-Dimer No results for  input(s): DDIMER in the last 72 hours. Hgb A1c No results for input(s): HGBA1C in the last 72 hours. Lipid Profile No results for input(s): CHOL, HDL, LDLCALC, TRIG, CHOLHDL, LDLDIRECT in the last 72 hours. Thyroid function studies No results for input(s): TSH, T4TOTAL, T3FREE, THYROIDAB in the last 72 hours.  Invalid input(s): FREET3 Anemia work up No results for input(s): VITAMINB12, FOLATE, FERRITIN, TIBC, IRON, RETICCTPCT in the last 72 hours. Urinalysis No results found for: COLORURINE,  APPEARANCEUR, LABSPEC, PHURINE, GLUCOSEU, HGBUR, BILIRUBINUR, KETONESUR, PROTEINUR, UROBILINOGEN, NITRITE, LEUKOCYTESUR Sepsis Labs Invalid input(s): PROCALCITONIN,  WBC,  LACTICIDVEN Microbiology No results found for this or any previous visit (from the past 240 hour(s)).   Time coordinating discharge: Over 30 minutes  SIGNED:   Kathie Dike, MD  [facility] 08/27/2016, 2:26 PM Pager   If 7PM-7AM, please contact night-coverage [email or web address] Password [email or web address]  Addendum 14:00:  I was called by patient's pharmacy that he had presented his prescriptions refilled. He had told the pharmacist "I do not need the antibiotic filled, I just need pain medicine". Pharmacist reports that patient had a prescription for oxycodone 30 mg tablets filled on 1/11 for 120 tablets. Prior to discharge, patient had told me that he had taken some extra oxycodone pills due to pain from cellulitis and did not have any further tablets. Patient has been in the hospital since 1/25 indicating that he would've had to consume 120 tablets in 14 days. I requested the pharmacist to not fill his Percocet prescription and to advise the patient to return to his regular doctor to obtain any further pain medications.  Richie Bonanno

## 2016-09-09 ENCOUNTER — Emergency Department (HOSPITAL_COMMUNITY)
Admission: EM | Admit: 2016-09-09 | Discharge: 2016-09-09 | Disposition: A | Discharge status: Home / Self Care | Payer: Medicare Other | Attending: Emergency Medicine | Admitting: Emergency Medicine

## 2016-09-09 ENCOUNTER — Encounter (HOSPITAL_COMMUNITY): Payer: Self-pay | Admitting: Emergency Medicine

## 2016-09-09 DIAGNOSIS — Z7982 Long term (current) use of aspirin: Secondary | ICD-10-CM | POA: Diagnosis not present

## 2016-09-09 DIAGNOSIS — R600 Localized edema: Secondary | ICD-10-CM

## 2016-09-09 DIAGNOSIS — Z7984 Long term (current) use of oral hypoglycemic drugs: Secondary | ICD-10-CM | POA: Insufficient documentation

## 2016-09-09 DIAGNOSIS — Z79899 Other long term (current) drug therapy: Secondary | ICD-10-CM | POA: Insufficient documentation

## 2016-09-09 DIAGNOSIS — E119 Type 2 diabetes mellitus without complications: Secondary | ICD-10-CM | POA: Diagnosis not present

## 2016-09-09 DIAGNOSIS — M7989 Other specified soft tissue disorders: Secondary | ICD-10-CM | POA: Diagnosis present

## 2016-09-09 DIAGNOSIS — I10 Essential (primary) hypertension: Secondary | ICD-10-CM | POA: Diagnosis not present

## 2016-09-09 DIAGNOSIS — F17200 Nicotine dependence, unspecified, uncomplicated: Secondary | ICD-10-CM | POA: Insufficient documentation

## 2016-09-09 LAB — COMPREHENSIVE METABOLIC PANEL
ALBUMIN: 3 g/dL — AB (ref 3.5–5.0)
ALT: 40 U/L (ref 17–63)
AST: 62 U/L — ABNORMAL HIGH (ref 15–41)
Alkaline Phosphatase: 187 U/L — ABNORMAL HIGH (ref 38–126)
Anion gap: 7 (ref 5–15)
BUN: 8 mg/dL (ref 6–20)
CO2: 26 mmol/L (ref 22–32)
Calcium: 8.3 mg/dL — ABNORMAL LOW (ref 8.9–10.3)
Chloride: 102 mmol/L (ref 101–111)
Creatinine, Ser: 0.9 mg/dL (ref 0.61–1.24)
GFR calc non Af Amer: >60 mL/min (ref >60)
GLUCOSE: 112 mg/dL — AB (ref 65–99)
POTASSIUM: 4 mmol/L (ref 3.5–5.1)
SODIUM: 135 mmol/L (ref 135–145)
Total Bilirubin: 1.2 mg/dL (ref 0.3–1.2)
Total Protein: 6.6 g/dL (ref 6.5–8.1)

## 2016-09-09 LAB — CBC WITH DIFFERENTIAL/PLATELET
BASOS ABS: 0 10*3/uL (ref 0.0–0.1)
Basophils Relative: 0 %
Eosinophils Absolute: 0.2 10*3/uL (ref 0.0–0.7)
Eosinophils Relative: 6 %
HEMATOCRIT: 30.8 % — AB (ref 39.0–52.0)
Hemoglobin: 10.5 g/dL — ABNORMAL LOW (ref 13.0–17.0)
LYMPHS ABS: 0.5 10*3/uL — AB (ref 0.7–4.0)
LYMPHS PCT: 13 %
MCH: 31.6 pg (ref 26.0–34.0)
MCHC: 34.1 g/dL (ref 30.0–36.0)
MCV: 92.8 fL (ref 78.0–100.0)
Monocytes Absolute: 0.5 10*3/uL (ref 0.1–1.0)
Monocytes Relative: 13 %
NEUTROS ABS: 2.4 10*3/uL (ref 1.7–7.7)
Neutrophils Relative %: 68 %
PLATELETS: 50 10*3/uL — AB (ref 150–400)
RBC: 3.32 MIL/uL — AB (ref 4.22–5.81)
RDW: 14.5 % (ref 11.5–15.5)
WBC: 3.5 10*3/uL — AB (ref 4.0–10.5)

## 2016-09-09 NOTE — Discharge Instructions (Signed)
See a physician if you develop chest pain, shortness of breath, fevers chills vomiting or new concerns. Continue to elevate your leg when not active or ambulating.   If you were given medicines take as directed.  If you are on coumadin or contraceptives realize their levels and effectiveness is altered by many different medicines.  If you have any reaction (rash, tongues swelling, other) to the medicines stop taking and see a physician.    If your blood pressure was elevated in the ER make sure you follow up for management with a primary doctor or return for chest pain, shortness of breath or stroke symptoms.  Please follow up as directed and return to the ER or see a physician for new or worsening symptoms.  Thank you. Vitals:   09/09/16 1008 09/09/16 1009  BP: 138/72   Pulse: 95   Resp: 18   Temp: 98.5 F (36.9 C)   TempSrc: Oral   SpO2: 95%   Weight:  210 lb (95.3 kg)  Height:  5\' 10"  (1.778 m)

## 2016-09-09 NOTE — ED Provider Notes (Signed)
[place] DEPT Provider Note   CSN: [id number] Arrival date & time: 09/09/16  [id number]     History   Chief Complaint Chief Complaint  Patient presents with  . Leg Swelling    HPI Chris Alvarez is a 56 y.o. male.  Patient presents with right leg swelling persistent since recent admission for cellulitis. Patient is diabetic and has been improving his control and has had weight loss. Patient just finished antibiotics and make sure his leg was okay.  Patient denies systemic symptoms, no worsening redness, no fevers or chills. Patient had an ultrasound done that did not show blood clot recently. No shortness of breath or chest pain.      Past Medical History:  Diagnosis Date  . Depressed   . DM2 (diabetes mellitus, type 2) ([place])   . HLD (hyperlipidemia)   . HTN (hypertension)     Patient Active Problem List   Diagnosis Date Noted  . Hypokalemia 08/23/2016  . Leukopenia 08/23/2016  . Thrombocytopenia ([place]) 08/23/2016  . Cellulitis of leg, right 08/22/2016  . Type 2 diabetes mellitus with hemoglobin A1c goal of less than 7.0% ([place]) 08/22/2016  . Hypertension 08/22/2016  . Tobacco dependence 08/22/2016  . Cellulitis of right lower leg 08/22/2016  . HYPERLIPIDEMIA-MIXED 09/17/2010  . TOBACCO ABUSE 09/17/2010    Past Surgical History:  Procedure Laterality Date  . knee replacement (other)         Home Medications    Prior to Admission medications   Medication Sig Start Date End Date Taking? Authorizing Provider  albuterol (PROAIR HFA) 108 (90 BASE) MCG/ACT inhaler Inhale 2 puffs into the lungs every 4 (four) hours as needed for wheezing or shortness of breath. 1-2 puffs every 4-6 hours prn    Historical Provider, MD  ALPRAZolam (XANAX) 1 MG tablet Take 1 mg by mouth 4 (four) times daily as needed for anxiety or sleep.     Historical Provider, MD  aspirin EC 81 MG tablet Take 81 mg by mouth daily.    Historical Provider, MD  doxycycline (VIBRA-TABS) 100 MG  tablet Take 1 tablet (100 mg total) by mouth 2 (two) times daily. 10 day course starting on 08/21/2016 08/27/16   Kathie Dike, MD  fenofibrate 160 MG tablet Take 160 mg by mouth daily.      Historical Provider, MD  furosemide (LASIX) 20 MG tablet Take 20 mg by mouth daily. 07/12/16   Historical Provider, MD  HUMALOG MIX 75/25 KWIKPEN (75-25) 100 UNIT/ML Kwikpen Inject 15 Units into the skin 2 (two) times daily. 07/12/16   Historical Provider, MD  lisinopril (PRINIVIL,ZESTRIL) 20 MG tablet Take 20 mg by mouth daily.      Historical Provider, MD  metFORMIN (GLUCOPHAGE) 500 MG tablet Take 500 mg by mouth 2 (two) times daily.      Historical Provider, MD  omeprazole (PRILOSEC) 20 MG capsule Take 20 mg by mouth daily.      Historical Provider, MD  oxyCODONE-acetaminophen (ROXICET) 5-325 MG tablet Take 1 tablet by mouth every 6 (six) hours as needed for severe pain. 08/27/16   Kathie Dike, MD  OXYCONTIN 40 MG 12 hr tablet Take 40 mg by mouth every 12 (twelve) hours. 08/10/16   Historical Provider, MD  rosuvastatin (CRESTOR) 10 MG tablet Take 10 mg by mouth daily.      Historical Provider, MD    Family History Family History  Problem Relation Age of Onset  . Coronary artery disease Father   . Diabetes Father  Social History Social History  Substance Use Topics  . Smoking status: Current Every Day Smoker    Packs/day: 1.00  . Smokeless tobacco: Never Used     Comment: quit 2011  . Alcohol use Yes     Comment: occasional     Allergies   Hydrocodone bitartrate er and Sulfonamide derivatives   Review of Systems Review of Systems  Constitutional: Negative for chills and fever.  Respiratory: Negative for shortness of breath.   Cardiovascular: Positive for leg swelling. Negative for chest pain.  Gastrointestinal: Negative for abdominal pain and vomiting.  Genitourinary: Negative for dysuria and flank pain.  Musculoskeletal: Negative for back pain, neck pain and neck stiffness.    Skin: Positive for color change.  Neurological: Negative for light-headedness and headaches.     Physical Exam Updated Vital Signs BP 138/72 (BP Location: Right Arm)   Pulse 95   Temp 98.5 F (36.9 C) (Oral)   Resp 18   Ht 5\' 10"  (1.778 m)   Wt 210 lb (95.3 kg)   SpO2 95%   BMI 30.13 kg/m   Physical Exam  Constitutional: He appears well-developed and well-nourished.  HENT:  Head: Normocephalic and atraumatic.  Neck: Neck supple.  Cardiovascular: Normal rate and regular rhythm.   Pulmonary/Chest: Effort normal.  Musculoskeletal: He exhibits edema. He exhibits no tenderness.  Neurological: He is alert.  Skin: Skin is warm and dry.  Patient has mild swelling with minimal pitting edema in the right lower extremity. Nontender. Mild erythema anterior. No significant warmth no streaking.  Psychiatric: He has a normal mood and affect.  Nursing note and vitals reviewed.    ED Treatments / Results  Labs (all labs ordered are listed, but only abnormal results are displayed) Labs Reviewed  CBC WITH DIFFERENTIAL/PLATELET - Abnormal; Notable for the following:       Result Value   WBC 3.5 (*)    RBC 3.32 (*)    Hemoglobin 10.5 (*)    HCT 30.8 (*)    Platelets 50 (*)    Lymphs Abs 0.5 (*)    All other components within normal limits  COMPREHENSIVE METABOLIC PANEL - Abnormal; Notable for the following:    Glucose, Bld 112 (*)    Calcium 8.3 (*)    Albumin 3.0 (*)    AST 62 (*)    Alkaline Phosphatase 187 (*)    All other components within normal limits    EKG  EKG Interpretation None       Radiology No results found.  Procedures Procedures (including critical care time)  Medications Ordered in ED Medications - No data to display   Initial Impression / Assessment and Plan / ED Course  I have reviewed the triage vital signs and the nursing notes.  Pertinent labs & imaging results that were available during my care of the patient were reviewed by me and  considered in my medical decision making (see chart for details).   well-appearing patient presents after finishing treatment for cellulitis. No indication to repeat antibiotics at this time. Discussed reasons to return in follow-up outpatient. Blood work and exam reassuring.   Results and differential diagnosis were discussed with the patient/parent/guardian. Xrays were independently reviewed by myself.  Close follow up outpatient was discussed, comfortable with the plan.   Medications - No data to display  Vitals:   09/09/16 1008 09/09/16 1009  BP: 138/72   Pulse: 95   Resp: 18   Temp: 98.5 F (36.9 C)  TempSrc: Oral   SpO2: 95%   Weight:  210 lb (95.3 kg)  Height:  5\' 10"  (1.778 m)    Final diagnoses:  Leg edema, right     Final Clinical Impressions(s) / ED Diagnoses   Final diagnoses:  Leg edema, right    New Prescriptions New Prescriptions   No medications on file     Elnora Morrison, MD 09/09/16 1448

## 2016-09-09 NOTE — ED Triage Notes (Signed)
Pt states he was recently tx for cellulitis of the right leg and has finished his abx.  States it is better but still red and swollen.

## 2016-10-02 ENCOUNTER — Emergency Department (HOSPITAL_COMMUNITY): Payer: Medicare Other

## 2016-10-02 ENCOUNTER — Emergency Department (HOSPITAL_COMMUNITY)
Admission: EM | Admit: 2016-10-02 | Discharge: 2016-10-02 | Disposition: A | Discharge status: Home / Self Care | Payer: Medicare Other | Attending: Emergency Medicine | Admitting: Emergency Medicine

## 2016-10-02 ENCOUNTER — Encounter (HOSPITAL_COMMUNITY): Payer: Self-pay | Admitting: *Deleted

## 2016-10-02 DIAGNOSIS — F17200 Nicotine dependence, unspecified, uncomplicated: Secondary | ICD-10-CM | POA: Insufficient documentation

## 2016-10-02 DIAGNOSIS — Z79899 Other long term (current) drug therapy: Secondary | ICD-10-CM | POA: Diagnosis not present

## 2016-10-02 DIAGNOSIS — L03115 Cellulitis of right lower limb: Secondary | ICD-10-CM

## 2016-10-02 DIAGNOSIS — Z7982 Long term (current) use of aspirin: Secondary | ICD-10-CM | POA: Diagnosis not present

## 2016-10-02 DIAGNOSIS — Z794 Long term (current) use of insulin: Secondary | ICD-10-CM | POA: Diagnosis not present

## 2016-10-02 DIAGNOSIS — M7989 Other specified soft tissue disorders: Secondary | ICD-10-CM | POA: Diagnosis present

## 2016-10-02 DIAGNOSIS — E119 Type 2 diabetes mellitus without complications: Secondary | ICD-10-CM | POA: Insufficient documentation

## 2016-10-02 DIAGNOSIS — R600 Localized edema: Secondary | ICD-10-CM

## 2016-10-02 DIAGNOSIS — I10 Essential (primary) hypertension: Secondary | ICD-10-CM | POA: Insufficient documentation

## 2016-10-02 LAB — CBC
HCT: 28.7 % — ABNORMAL LOW (ref 39.0–52.0)
Hemoglobin: 10 g/dL — ABNORMAL LOW (ref 13.0–17.0)
MCH: 31.4 pg (ref 26.0–34.0)
MCHC: 34.8 g/dL (ref 30.0–36.0)
MCV: 90.3 fL (ref 78.0–100.0)
PLATELETS: 53 10*3/uL — AB (ref 150–400)
RBC: 3.18 MIL/uL — AB (ref 4.22–5.81)
RDW: 16.3 % — AB (ref 11.5–15.5)
WBC: 3.3 10*3/uL — ABNORMAL LOW (ref 4.0–10.5)

## 2016-10-02 LAB — BASIC METABOLIC PANEL
Anion gap: 7 (ref 5–15)
BUN: 9 mg/dL (ref 6–20)
CO2: 24 mmol/L (ref 22–32)
CREATININE: 0.95 mg/dL (ref 0.61–1.24)
Calcium: 8 mg/dL — ABNORMAL LOW (ref 8.9–10.3)
Chloride: 100 mmol/L — ABNORMAL LOW (ref 101–111)
GFR calc Af Amer: >60 mL/min (ref >60)
GLUCOSE: 132 mg/dL — AB (ref 65–99)
POTASSIUM: 3.5 mmol/L (ref 3.5–5.1)
Sodium: 131 mmol/L — ABNORMAL LOW (ref 135–145)

## 2016-10-02 MED ORDER — VANCOMYCIN HCL IN DEXTROSE 1-5 GM/200ML-% IV SOLN: 1000.0000 mg | Freq: Once | INTRAVENOUS | Status: DC

## 2016-10-02 MED ORDER — DOXYCYCLINE HYCLATE 100 MG PO CAPS: 100.0000 mg | ORAL_CAPSULE | Freq: Two times a day (BID) | ORAL | 0 refills | Status: DC

## 2016-10-02 MED ORDER — DEXTROSE 5 % IV SOLN
1.0000 g | Freq: Once | INTRAVENOUS | Status: AC
  Administered 2016-10-02: 1 g via INTRAVENOUS
  Filled 2016-10-02: qty 10

## 2016-10-02 MED ORDER — OXYCODONE-ACETAMINOPHEN 5-325 MG PO TABS
2.0000 | ORAL_TABLET | Freq: Once | ORAL | Status: AC
  Administered 2016-10-02: 2 via ORAL
  Filled 2016-10-02: qty 2

## 2016-10-02 MED ORDER — DOXYCYCLINE HYCLATE 100 MG PO TABS
100.0000 mg | ORAL_TABLET | Freq: Once | ORAL | Status: AC
  Administered 2016-10-02: 100 mg via ORAL
  Filled 2016-10-02: qty 1

## 2016-10-02 MED ORDER — HYDROMORPHONE HCL 1 MG/ML IJ SOLN
1.0000 mg | Freq: Once | INTRAMUSCULAR | Status: AC
  Administered 2016-10-02: 1 mg via INTRAVENOUS
  Filled 2016-10-02: qty 1

## 2016-10-02 NOTE — ED Triage Notes (Signed)
Pt c/o recurrent pain and swelling to right lower leg; pt was in hospital x 1 month ago with same complaint

## 2016-10-02 NOTE — ED Provider Notes (Signed)
[facility] DEPT Provider Note   CSN: [id number] Arrival date & time: 10/02/16  [place]     History   Chief Complaint Chief Complaint  Patient presents with  . Leg Swelling    HPI Chris Alvarez is a 56 y.o. male.  HPI Patient presents emergency department with complaints of swelling or redness of his right lower extremity.  He's been hospitalized at [facility] as well as hospitalized in February here at [facility] for cellulitis of his right lower extremity.  He has chronic edema of his bilateral lower extremities right greater than left.  He denies fevers and chills.  Denies nausea vomiting.  Reports moderate pain in his right lower extremity.  He also has a long-standing history of chronic pain for which she is on long-acting opioid medications.  He is out of these medications will not have a refill until Saturday.  He is requesting pain medication at this time for the discomfort and pain in his right lower extremity.  No injury or trauma.  Pain is moderate to severe in severity.   Past Medical History:  Diagnosis Date  . Depressed   . DM2 (diabetes mellitus, type 2) ([place])   . HLD (hyperlipidemia)   . HTN (hypertension)     Patient Active Problem List   Diagnosis Date Noted  . Hypokalemia 08/23/2016  . Leukopenia 08/23/2016  . Thrombocytopenia ([place]) 08/23/2016  . Cellulitis of leg, right 08/22/2016  . Type 2 diabetes mellitus with hemoglobin A1c goal of less than 7.0% ([place]) 08/22/2016  . Hypertension 08/22/2016  . Tobacco dependence 08/22/2016  . Cellulitis of right lower leg 08/22/2016  . HYPERLIPIDEMIA-MIXED 09/17/2010  . TOBACCO ABUSE 09/17/2010    Past Surgical History:  Procedure Laterality Date  . knee replacement (other)         Home Medications    Prior to Admission medications   Medication Sig Start Date End Date Taking? Authorizing Provider  albuterol (PROAIR HFA) 108 (90 BASE) MCG/ACT inhaler Inhale 2 puffs into the lungs every 4 (four) hours as  needed for wheezing or shortness of breath. 1-2 puffs every 4-6 hours prn    Historical Provider, MD  ALPRAZolam (XANAX) 1 MG tablet Take 1 mg by mouth 4 (four) times daily as needed for anxiety or sleep.     Historical Provider, MD  aspirin EC 81 MG tablet Take 81 mg by mouth daily.    Historical Provider, MD  doxycycline (VIBRAMYCIN) 100 MG capsule Take 1 capsule (100 mg total) by mouth 2 (two) times daily. 10/02/16   Jola Schmidt, MD  fenofibrate 160 MG tablet Take 160 mg by mouth daily.      Historical Provider, MD  furosemide (LASIX) 20 MG tablet Take 20 mg by mouth daily. 07/12/16   Historical Provider, MD  HUMALOG MIX 75/25 KWIKPEN (75-25) 100 UNIT/ML Kwikpen Inject 15 Units into the skin 2 (two) times daily. 07/12/16   Historical Provider, MD  lisinopril (PRINIVIL,ZESTRIL) 20 MG tablet Take 20 mg by mouth daily.      Historical Provider, MD  metFORMIN (GLUCOPHAGE) 500 MG tablet Take 500 mg by mouth 2 (two) times daily.      Historical Provider, MD  omeprazole (PRILOSEC) 20 MG capsule Take 20 mg by mouth daily.      Historical Provider, MD  oxyCODONE-acetaminophen (ROXICET) 5-325 MG tablet Take 1 tablet by mouth every 6 (six) hours as needed for severe pain. 08/27/16   Kathie Dike, MD  OXYCONTIN 40 MG 12 hr tablet Take  40 mg by mouth every 12 (twelve) hours. 08/10/16   Historical Provider, MD  rosuvastatin (CRESTOR) 10 MG tablet Take 10 mg by mouth daily.      Historical Provider, MD    Family History Family History  Problem Relation Age of Onset  . Coronary artery disease Father   . Diabetes Father     Social History Social History  Substance Use Topics  . Smoking status: Current Every Day Smoker    Packs/day: 1.00  . Smokeless tobacco: Never Used     Comment: quit 2011  . Alcohol use Yes     Comment: occasional     Allergies   Hydrocodone bitartrate er and Sulfonamide derivatives   Review of Systems Review of Systems  All other systems reviewed and are  negative.    Physical Exam Updated Vital Signs BP 121/75 (BP Location: Right Arm)   Pulse 90   Temp 97.7 F (36.5 C) (Oral)   Resp 18   Ht 5\' 10"  (1.778 m)   Wt 210 lb (95.3 kg)   SpO2 100%   BMI 30.13 kg/m   Physical Exam  Constitutional: He is oriented to person, place, and time. He appears well-developed and well-nourished.  HENT:  Head: Normocephalic.  Eyes: EOM are normal.  Neck: Normal range of motion.  Pulmonary/Chest: Effort normal.  Abdominal: He exhibits no distension.  Musculoskeletal:  Bilateral edema right greater than left.  No swelling or tenderness of his right medial thigh.  Mild warmth and erythema of his right lower extremity anteriorly.  Normal pulses in his right foot.  Compartments of his right lower extremity are soft.  Neurological: He is alert and oriented to person, place, and time.  Psychiatric: He has a normal mood and affect.  Nursing note and vitals reviewed.    ED Treatments / Results  Labs (all labs ordered are listed, but only abnormal results are displayed) Labs Reviewed  CBC - Abnormal; Notable for the following:       Result Value   WBC 3.3 (*)    RBC 3.18 (*)    Hemoglobin 10.0 (*)    HCT 28.7 (*)    RDW 16.3 (*)    Platelets 53 (*)    All other components within normal limits  BASIC METABOLIC PANEL - Abnormal; Notable for the following:    Sodium 131 (*)    Chloride 100 (*)    Glucose, Bld 132 (*)    Calcium 8.0 (*)    All other components within normal limits  CULTURE, BLOOD (ROUTINE X 2)  CULTURE, BLOOD (ROUTINE X 2)    EKG  EKG Interpretation None       Radiology Dg Tibia/fibula Right  Result Date: 10/02/2016 CLINICAL DATA:  Pain and redness RIGHT lower extremity beginning yesterday. Similar symptoms 1 month ago. EXAM: RIGHT TIBIA AND FIBULA - 2 VIEW COMPARISON:  None. FINDINGS: There is no evidence of fracture or other focal bone lesions. Lower extremity soft tissue swelling and extending to the ankle without  subcutaneous gas or radiopaque foreign bodies. Moderate plantar calcaneal spur. Moderate to severe degenerative change of the knee. Small calcification or radiopaque foreign body within the medial femoral soft tissues seen on single view. IMPRESSION: Soft tissue swelling without acute osseous process. Electronically Signed   By: Elon Alas M.D.   On: 10/02/2016 21:40    Procedures Procedures (including critical care time)  Medications Ordered in ED Medications  vancomycin (VANCOCIN) IVPB 1000 mg/200 mL premix (not administered)  cefTRIAXone (ROCEPHIN) 1 g in dextrose 5 % 50 mL IVPB (1 g Intravenous New Bag/Given 10/02/16 2120)  doxycycline (VIBRA-TABS) tablet 100 mg (not administered)  HYDROmorphone (DILAUDID) injection 1 mg (1 mg Intravenous Given 10/02/16 2115)     Initial Impression / Assessment and Plan / ED Course  I have reviewed the triage vital signs and the nursing notes.  Pertinent labs & imaging results that were available during my care of the patient were reviewed by me and considered in my medical decision making (see chart for details).     Summary this may represent edema and changes of stasis.  No white blood cell count.  No fever.  Vital signs are stable.  Patient be placed on antibiotics.  Discharge home.  Area was marked with a skin marker.  Of asked that if his erythema or warmth worsened that he return to the ER for evaluation.  We'll trial the patient with outpatient antibiotics.  Close primary care follow-up.  Also recommended salt restriction, mild fluid restriction, elevation, compression stockings.  Final Clinical Impressions(s) / ED Diagnoses   Final diagnoses:  Edema of right lower extremity  Cellulitis of right lower extremity without foot    New Prescriptions New Prescriptions   DOXYCYCLINE (VIBRAMYCIN) 100 MG CAPSULE    Take 1 capsule (100 mg total) by mouth 2 (two) times daily.     Jola Schmidt, MD 10/02/16 2149

## 2016-10-07 LAB — CULTURE, BLOOD (ROUTINE X 2)
CULTURE: NO GROWTH
Culture: NO GROWTH

## 2016-11-05 ENCOUNTER — Ambulatory Visit (HOSPITAL_COMMUNITY): Payer: Medicare Other | Admitting: Oncology

## 2016-11-28 ENCOUNTER — Encounter (HOSPITAL_COMMUNITY): Payer: Medicare Other | Attending: Oncology | Admitting: Oncology

## 2016-11-28 ENCOUNTER — Encounter (HOSPITAL_COMMUNITY): Payer: Medicare Other

## 2016-11-28 ENCOUNTER — Encounter (HOSPITAL_COMMUNITY): Payer: Self-pay | Admitting: Oncology

## 2016-11-28 VITALS — BP 144/97 | HR 76 | Temp 98.8°F | Resp 18 | Ht 69.0 in | Wt 182.9 lb

## 2016-11-28 DIAGNOSIS — D696 Thrombocytopenia, unspecified: Secondary | ICD-10-CM | POA: Diagnosis not present

## 2016-11-28 DIAGNOSIS — D61818 Other pancytopenia: Secondary | ICD-10-CM

## 2016-11-28 HISTORY — DX: Other pancytopenia: D61.818

## 2016-11-28 LAB — CBC WITH DIFFERENTIAL/PLATELET
BASOS ABS: 0 10*3/uL (ref 0.0–0.1)
BASOS PCT: 0 %
EOS ABS: 0.2 10*3/uL (ref 0.0–0.7)
EOS PCT: 5 %
HCT: 32.7 % — ABNORMAL LOW (ref 39.0–52.0)
Hemoglobin: 11.3 g/dL — ABNORMAL LOW (ref 13.0–17.0)
LYMPHS ABS: 0.8 10*3/uL (ref 0.7–4.0)
Lymphocytes Relative: 16 %
MCH: 32.4 pg (ref 26.0–34.0)
MCHC: 34.6 g/dL (ref 30.0–36.0)
MCV: 93.7 fL (ref 78.0–100.0)
Monocytes Absolute: 0.4 10*3/uL (ref 0.1–1.0)
Monocytes Relative: 8 %
NEUTROS PCT: 71 %
Neutro Abs: 3.4 10*3/uL (ref 1.7–7.7)
PLATELETS: 74 10*3/uL — AB (ref 150–400)
RBC: 3.49 MIL/uL — AB (ref 4.22–5.81)
RDW: 14.8 % (ref 11.5–15.5)
WBC: 4.8 10*3/uL (ref 4.0–10.5)

## 2016-11-28 LAB — COMPREHENSIVE METABOLIC PANEL
ALBUMIN: 3.7 g/dL (ref 3.5–5.0)
ALT: 30 U/L (ref 17–63)
AST: 49 U/L — ABNORMAL HIGH (ref 15–41)
Alkaline Phosphatase: 141 U/L — ABNORMAL HIGH (ref 38–126)
Anion gap: 6 (ref 5–15)
BUN: 10 mg/dL (ref 6–20)
CHLORIDE: 110 mmol/L (ref 101–111)
CO2: 23 mmol/L (ref 22–32)
CREATININE: 0.84 mg/dL (ref 0.61–1.24)
Calcium: 9.2 mg/dL (ref 8.9–10.3)
GFR calc non Af Amer: >60 mL/min (ref >60)
GLUCOSE: 119 mg/dL — AB (ref 65–99)
Potassium: 3.6 mmol/L (ref 3.5–5.1)
SODIUM: 139 mmol/L (ref 135–145)
Total Bilirubin: 1 mg/dL (ref 0.3–1.2)
Total Protein: 8.3 g/dL — ABNORMAL HIGH (ref 6.5–8.1)

## 2016-11-28 LAB — RAPID HIV SCREEN (HIV 1/2 AB+AG)
HIV 1/2 Antibodies: NONREACTIVE
HIV-1 P24 ANTIGEN - HIV24: NONREACTIVE

## 2016-11-28 LAB — IRON AND TIBC
Iron: 48 ug/dL (ref 45–182)
Saturation Ratios: 13 % — ABNORMAL LOW (ref 17.9–39.5)
TIBC: 377 ug/dL (ref 250–450)
UIBC: 329 ug/dL

## 2016-11-28 LAB — SEDIMENTATION RATE: Sed Rate: 62 mm/hr — ABNORMAL HIGH (ref 0–16)

## 2016-11-28 LAB — RETICULOCYTES
RBC.: 3.49 MIL/uL — AB (ref 4.22–5.81)
RETIC CT PCT: 1.2 % (ref 0.4–3.1)
Retic Count, Absolute: 41.9 10*3/uL (ref 19.0–186.0)

## 2016-11-28 LAB — C-REACTIVE PROTEIN: CRP: <0.8 mg/dL (ref <1.0)

## 2016-11-28 LAB — FOLATE: Folate: 19 ng/mL (ref >5.9)

## 2016-11-28 LAB — FERRITIN: FERRITIN: 49 ng/mL (ref 24–336)

## 2016-11-28 LAB — LACTATE DEHYDROGENASE: LDH: 144 U/L (ref 98–192)

## 2016-11-28 LAB — VITAMIN B12: Vitamin B-12: 535 pg/mL (ref 180–914)

## 2016-11-28 NOTE — Assessment & Plan Note (Signed)
Thrombocytopenia since at least 2016, stable.  Degrees of thrombocytopenia can be divided into mild (platelet count 100,000 to 150,000/microL), moderate (50,000 to 99,000/microL), and severe (<50,000/microL). Severe thrombocytopenia confers a greater risk of bleeding, but the correlation between platelet count and bleeding risk varies according to the underlying condition and may be unpredictable.  Asymptomatic, incidental findings, mild thrombocytopenia is commonly caused by immune thrombocytopenia (ITP), occult liver disease, HIV infection, and myelodysplastic syndromes.  Congenital thrombocytopenic conditions, sometimes misdiagnosed as ITP, may also occur.  In a patient with incidentally discovered asymptomatic thrombocytopenia and a probable diagnosis of ITP, no further evaluation beyond routine history, physical examination, CBC, review of peripheral blood smear, testing for HIV and Hepatitis C is necessary.  Anti-platelet antibody studies are not routinely done and imaging studies and bone marrow aspiration and biopsy are not necessary unless other abnormalities are present.  The natural history of asymptomatic, mild thrombocytopenia was studied prospectively in 217 apparently healthy individuals referred to a hematology center for incidentally discovered platelet counts between 100,000 and 150,000/microL [27]. At six months of observation, 23 (11 percent) had normal platelet counts, two developed a myelodysplastic syndrome (refractory anemia), and one developed systemic lupus erythematosus. The remaining 191 individuals (88 percent) had persistent platelet counts <150,000/microL during this period without other signs of disease becoming evident; after five years, most (2 percent) had spontaneous resolution or persistent mild thrombocytopenia without development of an associated condition, supporting a diagnosis of ITP.  Labs today: CBC diff, CMET, peripheral smear review, HIV screen (HIV 1/2  Ab+Ag), hepatitis C antibody [in addition to HCV RNA testing in those who have a greater likelihood of false negative antibody testing (ie severely immunocompromised or hemodialysis patients or those suspected of having acute hepatitis C)], iron/TIBC, ferritin, vitamin B12, folate, serum copper.  Will perform an [place] of abdomen to evaluate for liver disease and splenomegaly.  If hepatitis C antibody is positive, then Hepatitis C RNA testing is indicated.  If HCV RNA is detected, the diagnosis of HCV infection is confirmed.  If HCV RNA is not detected, then a reactive antibody likely represents either a past HCV infection that subsequently was cleared of a false-positive antibody test.  If HIV 1/2 immunoassay is positive, then HIV-1/HIV-2 antibody differentiation immunoassay is necessary to determine type of HIV infection.  If negative or indeterminate, then HIV RNA testing is needed to determine acute HIV1 infection versus negative for HIV.

## 2016-11-28 NOTE — Patient Instructions (Addendum)
[place] at [facility] Discharge Instructions  RECOMMENDATIONS MADE BY THE CONSULTANT AND ANY TEST RESULTS WILL BE SENT TO YOUR REFERRING PHYSICIAN.   You were seen today by [name] PA-C. Labs today, we will call you with the results. Abdominal US ordered to be done within the next 3 weeks. Return in 3 weeks for follow up and results.    Thank you for choosing [facility] at [facility] to provide your oncology and hematology care.  To afford each patient quality time with our provider, please arrive at least 15 minutes before your scheduled appointment time.    If you have a lab appointment with the [facility] please come in thru the  Main Entrance and check in at the main information desk  You need to re-schedule your appointment should you arrive 10 or more minutes late.  We strive to give you quality time with our providers, and arriving late affects you and other patients whose appointments are after yours.  Also, if you no show three or more times for appointments you may be dismissed from the clinic at the providers discretion.     Again, thank you for choosing [facility].  Our hope is that these requests will decrease the amount of time that you wait before being seen by our physicians.       _____________________________________________________________  Should you have questions after your visit to [facility], please contact our office at [phone] between the hours of 8:30 a.m. and 4:30 p.m.  Voicemails left after 4:30 p.m. will not be returned until the following business day.  For prescription refill requests, have your pharmacy contact our office.       Resources For Cancer Patients and their Caregivers ? American Cancer Society: Can assist with transportation, wigs, general needs, runs Look Good Feel Better.        (737)370-1862 ? Cancer Care: Provides financial assistance, online  support groups, medication/co-pay assistance.  1-800-813-HOPE 4132928795) ? [place] Assists [place] cancer patients and their families through emotional , educational and financial support.  832-297-7853 ? [place] DSS Where to apply for food stamps, Medicaid and utility assistance. 204-343-4907 ? [employer]: Transportation to medical appointments. 585-537-5638 ? Social Security Administration: May apply for disability if have a Stage IV cancer. (726) 772-4998 215-699-2592 ? [employer], Disability and Transit Services: Assists with nutrition, care and transit needs. [place] Support Programs: @10RELATIVEDAYS @ > Cancer Support Group  2nd Tuesday of the month 1pm-2pm, Journey Room  > Creative Journey  3rd Tuesday of the month 1130am-1pm, Journey Room  > Look Good Feel Better  1st Wednesday of the month 10am-12 noon, Journey Room (Call [place] to register [phone])

## 2016-11-28 NOTE — Progress Notes (Signed)
[facility] Hematology/Oncology Consultation   Name: Chris Alvarez      MRN: [id number]    Location: Room/bed info not found  Date: 11/28/2016 Time:1:36 PM   REFERRING PHYSICIAN:  Octavio Graves, MD (Primary Care Physician)  REASON FOR CONSULT:  Thrombocytopenia   DIAGNOSIS:  Pancytopenia since Jan 2018.  Anemia and thrombocytopenia dates back to at least 2016.  HISTORY OF PRESENT ILLNESS:   Chris Alvarez is a 56 y.o. male with a medical history significant for diabetes mellitus, type II, essential hypertension, mixed hyperlipidemia,Send copy to Dr. Felisa Bonier at [facility] History of cellulitis of right lower extremity, GERD, degenerative disc disease, anxiety, ADHD, and history of alcoholism ongoing tobacco abuse, and history of alcoholism who is referred to the [facility] for thrombocytopenia, but actually he has pancytopenia.  He reports that he was worked up for blood abnormalities by Dr. Jacquiline Doe at [facility].  On chart review, he was last seen at [facility] in 2015.  According to last dictation in April 2015, it was believed that his alcoholism resulted in cirrhosis of liver causing abnormality in blood counts.  Today, the patient admits to easy bruising, decrease in appetite having lost 70 pounds over the past year, in early satiety.  He reports abdominal fullness as well.  He denies any abdominal pain.  He denies any B symptoms including fevers, chills, night sweats.  He does report that his weight loss was partly intentional, but there definitely is an element of unintentional weight loss.  He denies any blood in his stools or black stools.  He denies any hemoptysis or gross hematuria.  He denies any spontaneous bleeding or epistaxis.  This year, he had an issue with cellulitis of his right lower extremity.  He believes the cause of this was self-inflicted as he noted a rash that he excoriated.  This in combination with his immunocompromise state  with diabetes, he reports, likely resulted in the cellulitis.  He was treated with an antibiotic and he reports quick resolution.  He denies any repeated antibiotic needs.  Otherwise denies any hospitalizations.  He notes that his appetite is 25%.  He has no issues with his energy level and reports that 100%.  As mentioned above, he reports he notes that he used to weigh approximately 260 pounds, he weighs in today at 183 pounds.  A weight loss of 70 pounds 1 year.  He reports chronic mid back pain.  He rates it as a 7 out of 10.  HPI Elements Pancytopenia  Location: Blood  Quality: Low WBC, low HGB, low platelet count  Severity: Moderate  Duration: Since Jan 2018  Context:   Timing:   Modifying Factors: H/O EtOHism  Associated Signs & Symptoms: Easy bruising, decreased appetite, weight loss   Review of Systems  Constitutional: Positive for weight loss. Negative for chills and fever.  HENT: Negative.   Eyes: Negative.   Respiratory: Negative.  Negative for cough.   Cardiovascular: Negative.  Negative for chest pain.  Gastrointestinal: Negative for abdominal pain, blood in stool, constipation, diarrhea, melena, nausea and vomiting.       Early satiety  Genitourinary: Negative.   Musculoskeletal: Positive for back pain.  Skin: Negative.   Neurological: Negative.  Negative for weakness.  Endo/Heme/Allergies: Bruises/bleeds easily.  Psychiatric/Behavioral: Negative.      PAST MEDICAL HISTORY:   Past Medical History:  Diagnosis Date  . Depressed   . DM2 (diabetes  mellitus, type 2) ([place])   . HLD (hyperlipidemia)   . HTN (hypertension)   . Other pancytopenia ([place]) 11/28/2016  . Thrombocytopenia ([place]) 08/23/2016    ALLERGIES: Allergies  Allergen Reactions  . Hydrocodone Bitartrate Er Hives  . Sulfonamide Derivatives Photosensitivity      MEDICATIONS: I have reviewed the patient's current medications.    Current Outpatient Prescriptions on File Prior to Visit  Medication  Sig Dispense Refill  . albuterol (PROAIR HFA) 108 (90 BASE) MCG/ACT inhaler Inhale 2 puffs into the lungs every 4 (four) hours as needed for wheezing or shortness of breath. 1-2 puffs every 4-6 hours prn    . ALPRAZolam (XANAX) 1 MG tablet Take 1 mg by mouth 4 (four) times daily as needed for anxiety or sleep.     Marland Kitchen aspirin EC 81 MG tablet Take 81 mg by mouth daily.    . fenofibrate 160 MG tablet Take 160 mg by mouth daily.      Marland Kitchen HUMALOG MIX 75/25 KWIKPEN (75-25) 100 UNIT/ML Kwikpen Inject 15 Units into the skin 2 (two) times daily.    Marland Kitchen lisinopril (PRINIVIL,ZESTRIL) 20 MG tablet Take 20 mg by mouth daily.      . metFORMIN (GLUCOPHAGE) 500 MG tablet Take 500 mg by mouth 2 (two) times daily.      Marland Kitchen omeprazole (PRILOSEC) 20 MG capsule Take 20 mg by mouth daily.      . OXYCONTIN 40 MG 12 hr tablet Take 40 mg by mouth every 12 (twelve) hours.    . rosuvastatin (CRESTOR) 10 MG tablet Take 10 mg by mouth daily.      . furosemide (LASIX) 20 MG tablet Take 30 mg by mouth 2 (two) times daily.      No current facility-administered medications on file prior to visit.      PAST SURGICAL HISTORY Past Surgical History:  Procedure Laterality Date  . knee replacement (other)      FAMILY HISTORY: Family History  Problem Relation Age of Onset  . Hypertension Mother   . Heart Problems Mother   . Hyperlipidemia Mother   . Coronary artery disease Father   . Diabetes Father   . Cancer Paternal Uncle     stomach   He has 3 children all healthy ages 53 (son), 14 (daughter), and 47 (son). He has 3 sisters and one brother.  All alive. Mother is alive at the age of 87 and he reports that she has a "decent" health status. Father deceased at the age of 30 secondary to complications of congestive heart failure, and diabetes mellitus having undergone amputation of lower extremity 2.  SOCIAL HISTORY:  reports that he has been smoking Cigarettes.  He has a 42.00 pack-year smoking history. He has never used  smokeless tobacco. He reports that he does not drink alcohol or use drugs.  He reports a history of alcoholism quitting approximately January 2018 after drinking a 12 pack of beer daily 30+ years.  He reports a history of cocaine use quitting 13 years ago.  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  He is divorced 1.  He reports that he is [place] and religion.  Social History   Social History  . Marital status: Divorced    Spouse name: N/A  . Number of children: N/A  . Years of education: N/A   Social History Main Topics  . Smoking status: Current Every Day Smoker    Packs/day: 1.00  Years: 42.00    Types: Cigarettes  . Smokeless tobacco: Never Used     Comment: quit 2011  . Alcohol use No     Comment: Quit in Jan 2018 having drank 12 pack of beer daily.  . Drug use: No     Comment: History of cocaine use- [facility] admission 2005.   Marland Kitchen Sexual activity: Not Asked   Other Topics Concern  . None   Social History Narrative   Full time- Ecologist, Curator.     PERFORMANCE STATUS: The patient's performance status is 1 - Symptomatic but completely ambulatory  PHYSICAL EXAM: Most Recent Vital Signs: Blood pressure (!) 144/97, pulse 76, temperature 98.8 F (37.1 C), temperature source Oral, resp. rate 18, height 5' 9"  (1.753 m), weight 182 lb 14.4 oz (83 kg), SpO2 100 %. BP (!) 144/97 (BP Location: Left Arm, Patient Position: Sitting)   Pulse 76   Temp 98.8 F (37.1 C) (Oral)   Resp 18   Ht 5' 9"  (1.753 m)   Wt 182 lb 14.4 oz (83 kg)   SpO2 100%   BMI 27.01 kg/m   General Appearance:    Alert, cooperative, no distress, appears older stated age, chronically ill appearing, accompanied by sister.  Head:    Normocephalic, without obvious abnormality, atraumatic  Eyes:    Conjunctiva/corneas clear, EOM's intact, both eyes       Ears:    Normal TM's and external ear canals, both ears  Nose:   Nares normal, septum midline, mucosa normal, no  drainage    or sinus tenderness  Throat:   Lips, mucosa, and tongue normal; upper and lower dentures in place and gums normal  Neck:   Supple, symmetrical, trachea midline, no adenopathy;       thyroid:  No enlargement/tenderness/nodules.  Back:     Symmetric, no curvature, ROM normal, no CVA tenderness  Lungs:     Clear to auscultation bilaterally, respirations unlabored  Chest wall:    No tenderness or deformity  Heart:    Regular rate and rhythm, S1 and S2 normal, no murmur, rub   or gallop  Abdomen:     Soft, non-tender, bowel sounds active all four quadrants,    no masses, liver palpated on deep inspiration 3-4 cm below the costophrenic margin at midclavicular line and LUQ fullness on inspiration without obvious palpable splenomegaly.  Genitalia:    Not examined  Rectal:    Not examined  Extremities:   Extremities normal, atraumatic, no cyanosis or edema, LE varicosities  Pulses:   2+ and symmetric all extremities  Skin:   Skin color, texture, turgor normal, no rashes or lesions  Lymph nodes:   Cervical, supraclavicular, and axillary nodes normal  Neurologic:   CNII-XII intact. Normal strength, sensation and reflexes      throughout    LABORATORY DATA:           RADIOGRAPHY: No results found.     PATHOLOGY:  N/A  ASSESSMENT/PLAN:   Thrombocytopenia (HCC) Thrombocytopenia since at least 2016, stable.  Degrees of thrombocytopenia can be divided into mild (platelet count 100,000 to 150,000/microL), moderate (50,000 to 99,000/microL), and severe (<50,000/microL). Severe thrombocytopenia confers a greater risk of bleeding, but the correlation between platelet count and bleeding risk varies according to the underlying condition and may be unpredictable.  Asymptomatic, incidental findings, mild thrombocytopenia is commonly caused by immune thrombocytopenia (ITP), occult liver disease, HIV infection, and myelodysplastic syndromes.  Congenital thrombocytopenic conditions,  sometimes misdiagnosed as ITP, may  also occur.  In a patient with incidentally discovered asymptomatic thrombocytopenia and a probable diagnosis of ITP, no further evaluation beyond routine history, physical examination, CBC, review of peripheral blood smear, testing for HIV and Hepatitis C is necessary.  Anti-platelet antibody studies are not routinely done and imaging studies and bone marrow aspiration and biopsy are not necessary unless other abnormalities are present.  The natural history of asymptomatic, mild thrombocytopenia was studied prospectively in 217 apparently healthy individuals referred to a hematology center for incidentally discovered platelet counts between 100,000 and 150,000/microL [27]. At six months of observation, 23 (11 percent) had normal platelet counts, two developed a myelodysplastic syndrome (refractory anemia), and one developed systemic lupus erythematosus. The remaining 191 individuals (88 percent) had persistent platelet counts <150,000/microL during this period without other signs of disease becoming evident; after five years, most (3 percent) had spontaneous resolution or persistent mild thrombocytopenia without development of an associated condition, supporting a diagnosis of ITP.  Labs today: CBC diff, CMET, peripheral smear review, HIV screen (HIV 1/2 Ab+Ag), hepatitis C antibody [in addition to HCV RNA testing in those who have a greater likelihood of false negative antibody testing (ie severely immunocompromised or hemodialysis patients or those suspected of having acute hepatitis C)], iron/TIBC, ferritin, vitamin B12, folate, serum copper.  Will perform an [place] of abdomen to evaluate for liver disease and splenomegaly.  If hepatitis C antibody is positive, then Hepatitis C RNA testing is indicated.  If HCV RNA is detected, the diagnosis of HCV infection is confirmed.  If HCV RNA is not detected, then a reactive antibody likely represents either a past HCV infection  that subsequently was cleared of a false-positive antibody test.  If HIV 1/2 immunoassay is positive, then HIV-1/HIV-2 antibody differentiation immunoassay is necessary to determine type of HIV infection.  If negative or indeterminate, then HIV RNA testing is needed to determine acute HIV1 infection versus negative for HIV.  Other pancytopenia ([place]) Pancytopenia since Jan 2018 in the setting of EtOHism (quit Jan 2018) and possible bone marrow suppression from EtOH- possibly cirrhosis of liver and splenomegaly as well.  Labs today: CBC diff, CMET, peripheral smear review, HIV screen (HIV 1/2 Ab+Ag), hepatitis C antibody [in addition to HCV RNA testing in those who have a greater likelihood of false negative antibody testing (ie severely immunocompromised or hemodialysis patients or those suspected of having acute hepatitis C)], iron/TIBC, ferritin, vitamin B12, folate, serum copper.  SPEP + IFE and light chain assay also ordered.  Will perform an [place] of abdomen to evaluate for liver disease and splenomegaly.  If no explanation is discovered, patient will need a bone marrow aspiration and biopsy.    Indications for bone marrow aspiration and biopsy:  A. Unexplained anemia  B. Macrocytic anemia(to distinguish megaloblastic from normoblastic maturation)  C. Unexplained leukopenia  D. Unexplained thrombocytopenia  E. Pancytopenia  F. Presence of blasts on peripheral smear (investigation for possible leukemia)  G. Presence of teardrop red cells on peripheral smear (possible myelofibrosis)  H. Presence of hairy cells on peripheral smear (possible hairy cell leukemia)  I. Suspected Multiple Myeloma  J. Staging for non-Hodgkin's Lymphoma  K. Unexplained splenomegaly (possible splenomegaly)  L. Suspected storage disease (eg: Gaucher disease, Niemann-Pick)  M. Fever of unknown origin  N. Suspected chromosomal disorders in neonates (requiring rapid confirmation).  O. Confirmation of normal marrow in  potential allogenic donor  P. Work-up for amyloidosis (to detect clonal plasma cell disorder)  Return in 3 weeks for follow-up and further  hematology recommendations based upon tests ordered above.   ORDERS PLACED FOR THIS ENCOUNTER: Orders Placed This Encounter  Procedures  . US Abdomen Complete  . CBC with Differential  . Comprehensive metabolic panel  . Pathologist smear review  . C-reactive protein  . Copper, serum  . Lactate dehydrogenase  . Sedimentation rate  . Vitamin B12  . Folate  . Iron and TIBC  . Ferritin  . Erythropoietin  . Haptoglobin  . Reticulocytes  . Hepatitis c antibody (reflex)  . Rapid HIV screen (HIV 1/2 Ab+Ag)  . Kappa/lambda light chains  . IgG, IgA, IgM  . Immunofixation electrophoresis  . Protein electrophoresis, serum    MEDICATIONS PRESCRIBED THIS ENCOUNTER: None  All questions were answered. The patient knows to call the clinic with any problems, questions or concerns. We can certainly see the patient much sooner if necessary.  Patient discussed with Dr. Talbert Cage and together we ascertained an up-to-date interval history, and examined the patient.  Dr. Talbert Cage developed the patient's assessment and plan.  This was a shared visit-consultation.  Her attestation will follow below.  This note is electronically signed by: Doy Mince 11/28/2016 1:36 PM

## 2016-11-28 NOTE — Assessment & Plan Note (Addendum)
Pancytopenia since Jan 2018 in the setting of EtOHism (quit Jan 2018) and possible bone marrow suppression from EtOH- possibly cirrhosis of liver and splenomegaly as well.  Labs today: CBC diff, CMET, peripheral smear review, HIV screen (HIV 1/2 Ab+Ag), hepatitis C antibody [in addition to HCV RNA testing in those who have a greater likelihood of false negative antibody testing (ie severely immunocompromised or hemodialysis patients or those suspected of having acute hepatitis C)], iron/TIBC, ferritin, vitamin B12, folate, serum copper.  SPEP + IFE and light chain assay also ordered.  Will perform an [place] of abdomen to evaluate for liver disease and splenomegaly.  If no explanation is discovered, patient will need a bone marrow aspiration and biopsy.    Indications for bone marrow aspiration and biopsy:  A. Unexplained anemia  B. Macrocytic anemia(to distinguish megaloblastic from normoblastic maturation)  C. Unexplained leukopenia  D. Unexplained thrombocytopenia  E. Pancytopenia  F. Presence of blasts on peripheral smear (investigation for possible leukemia)  G. Presence of teardrop red cells on peripheral smear (possible myelofibrosis)  H. Presence of hairy cells on peripheral smear (possible hairy cell leukemia)  I. Suspected Multiple Myeloma  J. Staging for non-Hodgkin's Lymphoma  K. Unexplained splenomegaly (possible splenomegaly)  L. Suspected storage disease (eg: Gaucher disease, Niemann-Pick)  M. Fever of unknown origin  N. Suspected chromosomal disorders in neonates (requiring rapid confirmation).  O. Confirmation of normal marrow in potential allogenic donor  P. Work-up for amyloidosis (to detect clonal plasma cell disorder)  Return in 3 weeks for follow-up and further hematology recommendations based upon tests ordered above.

## 2016-11-29 LAB — PROTEIN ELECTROPHORESIS, SERUM
A/G Ratio: 0.9 (ref 0.7–1.7)
ALPHA-1-GLOBULIN: 0.3 g/dL (ref 0.0–0.4)
ALPHA-2-GLOBULIN: 0.6 g/dL (ref 0.4–1.0)
Albumin ELP: 3.8 g/dL (ref 2.9–4.4)
BETA GLOBULIN: 1.2 g/dL (ref 0.7–1.3)
GAMMA GLOBULIN: 2.3 g/dL — AB (ref 0.4–1.8)
Globulin, Total: 4.3 g/dL — ABNORMAL HIGH (ref 2.2–3.9)
M-SPIKE, %: 0.5 g/dL — AB
Total Protein ELP: 8.1 g/dL (ref 6.0–8.5)

## 2016-11-29 LAB — HCV COMMENT:

## 2016-11-29 LAB — HEPATITIS C ANTIBODY (REFLEX)

## 2016-11-29 LAB — ERYTHROPOIETIN: Erythropoietin: 16.5 m[IU]/mL (ref 2.6–18.5)

## 2016-11-29 LAB — KAPPA/LAMBDA LIGHT CHAINS
KAPPA FREE LGHT CHN: 54.7 mg/L — AB (ref 3.3–19.4)
KAPPA, LAMDA LIGHT CHAIN RATIO: 1.31 (ref 0.26–1.65)
LAMDA FREE LIGHT CHAINS: 41.8 mg/L — AB (ref 5.7–26.3)

## 2016-11-29 LAB — IGG, IGA, IGM
IGA: 971 mg/dL — AB (ref 90–386)
IGG (IMMUNOGLOBIN G), SERUM: 1940 mg/dL — AB (ref 700–1600)
IgM, Serum: 257 mg/dL — ABNORMAL HIGH (ref 20–172)

## 2016-11-29 LAB — HAPTOGLOBIN: Haptoglobin: 76 mg/dL (ref 34–200)

## 2016-11-30 LAB — COPPER, SERUM: COPPER: 117 ug/dL (ref 72–166)

## 2016-12-02 LAB — IMMUNOFIXATION ELECTROPHORESIS
IGA: 988 mg/dL — AB (ref 90–386)
IGG (IMMUNOGLOBIN G), SERUM: 1901 mg/dL — AB (ref 700–1600)
IgM, Serum: 262 mg/dL — ABNORMAL HIGH (ref 20–172)
Total Protein ELP: 7.8 g/dL (ref 6.0–8.5)

## 2016-12-05 ENCOUNTER — Ambulatory Visit (HOSPITAL_COMMUNITY): Admission: RE | Admit: 2016-12-05 | Payer: Medicare Other | Source: Ambulatory Visit

## 2016-12-19 ENCOUNTER — Ambulatory Visit (HOSPITAL_COMMUNITY): Payer: Medicare Other

## 2017-01-31 ENCOUNTER — Encounter (HOSPITAL_COMMUNITY): Payer: Medicare Other | Attending: Oncology | Admitting: Adult Health

## 2017-01-31 ENCOUNTER — Encounter (HOSPITAL_COMMUNITY): Payer: Self-pay | Admitting: Adult Health

## 2017-01-31 ENCOUNTER — Encounter (HOSPITAL_COMMUNITY): Payer: Medicare Other

## 2017-01-31 VITALS — BP 147/82 | HR 85 | Temp 99.0°F | Resp 18 | Wt 182.8 lb

## 2017-01-31 DIAGNOSIS — D472 Monoclonal gammopathy: Secondary | ICD-10-CM

## 2017-01-31 DIAGNOSIS — D696 Thrombocytopenia, unspecified: Secondary | ICD-10-CM | POA: Diagnosis present

## 2017-01-31 DIAGNOSIS — D649 Anemia, unspecified: Secondary | ICD-10-CM | POA: Diagnosis not present

## 2017-01-31 LAB — CBC WITH DIFFERENTIAL/PLATELET
BASOS PCT: 0 %
Basophils Absolute: 0 10*3/uL (ref 0.0–0.1)
Eosinophils Absolute: 0.1 10*3/uL (ref 0.0–0.7)
Eosinophils Relative: 4 %
HEMATOCRIT: 35 % — AB (ref 39.0–52.0)
HEMOGLOBIN: 11.8 g/dL — AB (ref 13.0–17.0)
Lymphocytes Relative: 12 %
Lymphs Abs: 0.4 10*3/uL — ABNORMAL LOW (ref 0.7–4.0)
MCH: 31.3 pg (ref 26.0–34.0)
MCHC: 33.7 g/dL (ref 30.0–36.0)
MCV: 92.8 fL (ref 78.0–100.0)
MONOS PCT: 11 %
Monocytes Absolute: 0.4 10*3/uL (ref 0.1–1.0)
NEUTROS ABS: 2.4 10*3/uL (ref 1.7–7.7)
NEUTROS PCT: 73 %
Platelets: 53 10*3/uL — ABNORMAL LOW (ref 150–400)
RBC: 3.77 MIL/uL — ABNORMAL LOW (ref 4.22–5.81)
RDW: 15.4 % (ref 11.5–15.5)
WBC: 3.3 10*3/uL — ABNORMAL LOW (ref 4.0–10.5)

## 2017-01-31 LAB — COMPREHENSIVE METABOLIC PANEL
ALBUMIN: 4.1 g/dL (ref 3.5–5.0)
ALK PHOS: 122 U/L (ref 38–126)
ALT: 32 U/L (ref 17–63)
ANION GAP: 9 (ref 5–15)
AST: 58 U/L — ABNORMAL HIGH (ref 15–41)
BILIRUBIN TOTAL: 1.5 mg/dL — AB (ref 0.3–1.2)
BUN: 11 mg/dL (ref 6–20)
CALCIUM: 9.8 mg/dL (ref 8.9–10.3)
CO2: 27 mmol/L (ref 22–32)
CREATININE: 0.83 mg/dL (ref 0.61–1.24)
Chloride: 102 mmol/L (ref 101–111)
GFR calc Af Amer: >60 mL/min (ref >60)
GFR calc non Af Amer: >60 mL/min (ref >60)
GLUCOSE: 83 mg/dL (ref 65–99)
Potassium: 4.1 mmol/L (ref 3.5–5.1)
Sodium: 138 mmol/L (ref 135–145)
TOTAL PROTEIN: 7.9 g/dL (ref 6.5–8.1)

## 2017-01-31 LAB — VITAMIN B12: VITAMIN B 12: 665 pg/mL (ref 180–914)

## 2017-01-31 LAB — IRON AND TIBC
IRON: 125 ug/dL (ref 45–182)
SATURATION RATIOS: 27 % (ref 17.9–39.5)
TIBC: 459 ug/dL — AB (ref 250–450)
UIBC: 334 ug/dL

## 2017-01-31 LAB — FERRITIN: Ferritin: 36 ng/mL (ref 24–336)

## 2017-01-31 LAB — FOLATE: FOLATE: 31.9 ng/mL (ref >5.9)

## 2017-01-31 LAB — LACTATE DEHYDROGENASE: LDH: 165 U/L (ref 98–192)

## 2017-01-31 NOTE — Progress Notes (Signed)
[place], [place] [address]   CLINIC:  Medical Oncology/Hematology  PCP:  Octavio Graves, [place] [address] [place] [address] [phone]   REASON FOR VISIT:  Follow-up for Pancytopenia  CURRENT THERAPY: Initial work-up    HISTORY OF PRESENT ILLNESS:  (From [name], PA-C's last note on 11/28/16)       INTERVAL HISTORY:  Chris Alvarez 56 y.o. male returns for follow-up of pancytopenia.   Reports that he intentionally did not come to his previously scheduled follow-up visits d/t fear "and I wasn't really sure I wanted to know the results of everything that y'all were looking at."  Reports that he continues to have some fear, but feels ready to understand better what may be wrong with him.   Reports that he continues to have no appetite; weight is down about 40 lbs since 07/2016.  Energy levels are 50% and endorses fatigue.  Denies any fever, chills, or night sweats.    He has chronic back pain, which has been present for many years. As a result, he has occasional (L) leg numbness and muscle cramping. Other than back pain, he denies any other focal bone pain.  He has a history of (R) leg cellulitis, for which he was hospitalized in 07/2016.    Denies any bleeding episodes including blood in his stools, dark/tarry stools, blood in his urine, nosebleeds, or gingival bleeding.  No changes in his bowel or bladder.  Reports that his abdomen "makes loud noises even when I'm not hungry."  An ultrasound of his abdomen was ordered by [name], PA-C at his last visit to the cancer center, but [name] did not go for this imaging.    He smokes about 1 ppd of cigarettes daily.  History of heavy alcohol consumption.  Reports that he continues to drink alcohol a few days per week; generally 3-4 beers at each occasion.     REVIEW OF SYSTEMS:  Review of Systems  Constitutional: Positive for appetite change, fatigue and unexpected weight  change. Negative for chills, diaphoresis and fever.  HENT:  Negative.   Eyes: Negative.   Respiratory: Negative.  Negative for cough and shortness of breath.   Cardiovascular: Positive for leg swelling.  Gastrointestinal: Negative.  Negative for abdominal pain, blood in stool, constipation, diarrhea, nausea and vomiting.  Endocrine: Negative.   Genitourinary: Negative.  Negative for dysuria and hematuria.   Musculoskeletal: Positive for arthralgias, back pain and myalgias.  Skin: Negative.   Neurological: Positive for numbness.  Hematological: Bruises/bleeds easily.  Psychiatric/Behavioral: Positive for sleep disturbance. The patient is nervous/anxious.      PAST MEDICAL/SURGICAL HISTORY:  Past Medical History:  Diagnosis Date  . Depressed   . DM2 (diabetes mellitus, type 2) ([place])   . HLD (hyperlipidemia)   . HTN (hypertension)   . Other pancytopenia ([place]) 11/28/2016  . Thrombocytopenia ([place]) 08/23/2016   Past Surgical History:  Procedure Laterality Date  . knee replacement (other)       SOCIAL HISTORY:  Social History   Social History  . Marital status: Divorced    Spouse name: N/A  . Number of children: N/A  . Years of education: N/A   Occupational History  . Not on file.   Social History Main Topics  . Smoking status: Current Every Day Smoker    Packs/day: 1.00    Years: 42.00    Types: Cigarettes  . Smokeless tobacco: Never Used  Comment: quit 2011  . Alcohol use No     Comment: Quit in Jan 2018 having drank 12 pack of beer daily.  . Drug use: No     Comment: History of cocaine use- [facility] admission 2005.   Marland Kitchen Sexual activity: Not on file   Other Topics Concern  . 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     FAMILY HISTORY:  Family History  Problem Relation Age of Onset  . Hypertension Mother   . Heart Problems Mother   . Hyperlipidemia Mother   . Coronary artery disease Father   . Diabetes Father   . Cancer  Paternal Uncle        stomach    CURRENT MEDICATIONS:  Outpatient Encounter Prescriptions as of 01/31/2017  Medication Sig  . albuterol (PROAIR HFA) 108 (90 BASE) MCG/ACT inhaler Inhale 2 puffs into the lungs every 4 (four) hours as needed for wheezing or shortness of breath. 1-2 puffs every 4-6 hours prn  . ALPRAZolam (XANAX) 1 MG tablet Take 1 mg by mouth 4 (four) times daily as needed for anxiety or sleep.   Marland Kitchen amphetamine-dextroamphetamine (ADDERALL) 5 MG tablet   . aspirin EC 81 MG tablet Take 81 mg by mouth daily.  . fenofibrate 160 MG tablet Take 160 mg by mouth daily.    . furosemide (LASIX) 20 MG tablet Take 30 mg by mouth 2 (two) times daily.   Marland Kitchen HUMALOG MIX 75/25 KWIKPEN (75-25) 100 UNIT/ML Kwikpen Inject 15 Units into the skin 2 (two) times daily.  Marland Kitchen lisinopril (PRINIVIL,ZESTRIL) 20 MG tablet Take 20 mg by mouth daily.    . metFORMIN (GLUCOPHAGE) 500 MG tablet Take 500 mg by mouth 2 (two) times daily.    Marland Kitchen omeprazole (PRILOSEC) 20 MG capsule Take 20 mg by mouth daily.    Marland Kitchen oxycodone (ROXICODONE) 30 MG immediate release tablet Take 30 mg by mouth every 6 (six) hours as needed for pain.  . OXYCONTIN 40 MG 12 hr tablet Take 40 mg by mouth every 12 (twelve) hours.  . rosuvastatin (CRESTOR) 10 MG tablet Take 10 mg by mouth daily.     No facility-administered encounter medications on file as of 01/31/2017.     ALLERGIES:  Allergies  Allergen Reactions  . Hydrocodone Bitartrate Er Hives  . Sulfonamide Derivatives Photosensitivity     PHYSICAL EXAM:  ECOG Performance status: 1 - Symptomatic; remains independent.   Vitals:   01/31/17 1108  BP: (!) 147/82  Pulse: 85  Resp: 18  Temp: 99 F (37.2 C)   Filed Weights   01/31/17 1108  Weight: 182 lb 12.8 oz (82.9 kg)    Physical Exam  Constitutional: He is oriented to person, place, and time and well-developed, well-nourished, and in no distress.  HENT:  Head: Normocephalic.  Mouth/Throat: Oropharynx is clear and moist. No  oropharyngeal exudate.  Eyes: Conjunctivae are normal. Pupils are equal, round, and reactive to light. No scleral icterus.  Neck: Normal range of motion. Neck supple.  Cardiovascular: Normal rate and regular rhythm.   Pulmonary/Chest: Effort normal. He has wheezes (Expiratory wheezes).  Abdominal: Soft. He exhibits distension (Mild abd distension; hepatomegaly appreciated on exam. ). There is no tenderness. There is no rebound and no guarding.  Hyperactive bowel sounds  Musculoskeletal: Normal range of motion. He exhibits edema (1+ BLE edema (right LE slightly > left LE)).  Lymphadenopathy:    He has no cervical adenopathy.  Neurological: He is alert  and oriented to person, place, and time. No cranial nerve deficit. Gait normal.  Skin: Skin is warm and dry. There is erythema (Mild erythema to RLE (h/o cellulitis); no active infection apparent ).  Psychiatric: Mood, memory, affect and judgment normal.  Nursing note and vitals reviewed.    LABORATORY DATA:  I have reviewed the labs as listed.  CBC    Component Value Date/Time   WBC 3.3 (L) 01/31/2017 1156   RBC 3.77 (L) 01/31/2017 1156   HGB 11.8 (L) 01/31/2017 1156   HCT 35.0 (L) 01/31/2017 1156   PLT 53 (L) 01/31/2017 1156   MCV 92.8 01/31/2017 1156   MCH 31.3 01/31/2017 1156   MCHC 33.7 01/31/2017 1156   RDW 15.4 01/31/2017 1156   LYMPHSABS 0.4 (L) 01/31/2017 1156   MONOABS 0.4 01/31/2017 1156   EOSABS 0.1 01/31/2017 1156   BASOSABS 0.0 01/31/2017 1156   CMP Latest Ref Rng & Units 01/31/2017 11/28/2016 10/02/2016  Glucose 65 - 99 mg/dL 83 119(H) 132(H)  BUN 6 - 20 mg/dL 11 10 9   Creatinine 0.61 - 1.24 mg/dL 0.83 0.84 0.95  Sodium 135 - 145 mmol/L 138 139 131(L)  Potassium 3.5 - 5.1 mmol/L 4.1 3.6 3.5  Chloride 101 - 111 mmol/L 102 110 100(L)  CO2 22 - 32 mmol/L 27 23 24   Calcium 8.9 - 10.3 mg/dL 9.8 9.2 8.0(L)  Total Protein 6.5 - 8.1 g/dL 7.9 8.3(H) -  Total Bilirubin 0.3 - 1.2 mg/dL 1.5(H) 1.0 -  Alkaline Phos 38 - 126 U/L  122 141(H) -  AST 15 - 41 U/L 58(H) 49(H) -  ALT 17 - 63 U/L 32 30 -   Results for Chris Alvarez, Chris Alvarez (MRN [id number])   Ref. Range 11/28/2016 12:27  Haptoglobin Latest Ref Range: 34 - 200 mg/dL 76  Erythropoietin Latest Ref Range: 2.6 - 18.5 mIU/mL 16.5  Sed Rate Latest Ref Range: 0 - 16 mm/hr 62 (H)   Results for Chris Alvarez, Chris Alvarez (MRN [id number])  Ref. Range 11/28/2016 12:28  Iron Latest Ref Range: 45 - 182 ug/dL 48  UIBC Latest Units: ug/dL 329  TIBC Latest Ref Range: 250 - 450 ug/dL 377  Saturation Ratios Latest Ref Range: 17.9 - 39.5 % 13 (L)  Ferritin Latest Ref Range: 24 - 336 ng/mL 49  Folate Latest Ref Range: >5.9 ng/mL 19.0  Results for Chris Alvarez, Chris Alvarez (MRN [id number])   Ref. Range 11/28/2016 12:27 11/28/2016 12:28  Copper Latest Ref Range: 72 - 166 ug/dL 117   CRP Latest Ref Range: <1.0 mg/dL  <0.8  Vitamin B12 Latest Ref Range: 180 - 914 pg/mL  535  Results for Chris Alvarez, Chris Alvarez (MRN [id number])   Ref. Range 11/28/2016 12:27 11/28/2016 12:28  Total Protein ELP Latest Ref Range: 6.0 - 8.5 g/dL  8.1  Albumin ELP Latest Ref Range: 2.9 - 4.4 g/dL  3.8  Globulin, Total Latest Ref Range: 2.2 - 3.9 g/dL  4.3 (H)  A/G Ratio Latest Ref Range: 0.7 - 1.7   0.9  Alpha-1-Globulin Latest Ref Range: 0.0 - 0.4 g/dL  0.3  Alpha-2-Globulin Latest Ref Range: 0.4 - 1.0 g/dL  0.6  Beta Globulin Latest Ref Range: 0.7 - 1.3 g/dL  1.2  Gamma Globulin Latest Ref Range: 0.4 - 1.8 g/dL  2.3 (H)  M-SPIKE, % Latest Ref Range: Not Observed g/dL  0.5 (H)  SPE Interp. Unknown  Comment  Comment Unknown  Comment  IgG (Immunoglobin G), Serum Latest Ref Range: 700 - 1,600 mg/dL 1,940 (H)  IgA Latest Ref Range: 90 - 386 mg/dL 971 (H)   IgM, Serum Latest Ref Range: 20 - 172 mg/dL 257 (H)   Kappa free light chain Latest Ref Range: 3.3 - 19.4 mg/L 54.7 (H)   Lamda free light chains Latest Ref Range: 5.7 - 26.3 mg/L 41.8 (H)   Kappa, lamda light chain ratio Latest Ref Range: 0.26 - 1.65  1.31    Results for  Chris Alvarez, Chris Alvarez (MRN [id number]) as of 01/31/2017 18:41  Ref. Range 11/28/2016 12:27  IgG (Immunoglobin G), Serum Latest Ref Range: 700 - 1,600 mg/dL 1,940 (H)  IgA Latest Ref Range: 90 - 386 mg/dL 971 (H)   Results for Chris Alvarez, Chris Alvarez (MRN [id number]) as of 01/31/2017 18:41  Ref. Range 11/28/2016 12:27  HCV Ab Latest Ref Range: 0.0 - 0.9 s/co ratio <0.1  Comment: Unknown Comment  HIV 1/2 Antibodies Latest Ref Range: NON REACTIVE  NON REACTIVE  Interpretation (HIV Ag Ab) Unknown A non reactive te...  HIV-1 P24 Antigen - HIV24 Latest Ref Range: NON REACTIVE  NON REACTIVE   PENDING LABS:    DIAGNOSTIC IMAGING:    PATHOLOGY:     ASSESSMENT & PLAN:   Anemia/Thrombocytopenia:  -Reviewed results of laboratory studies we have collected thus far.  Hepatitis and HIV negative. No folate/vitamin B12 deficiencies. Possible mild iron deficiency could be playing a role in his anemia.   -Discussed results of SPEP/IFE revealing M-spike 0.5 and elevated kappa/lambda light chains, but with normal kappa/lambda light chain ratio. Discussed that this could be MGUS vs possible multiple myeloma, though we have low suspicion that cancer is the etiology of his lab abnormalities. The only "CRAB" symptom he has is anemia; no hypercalcemia, no renal failure, and no focal bone pain aside from chronic low back pain.  -Will obtain skeletal survey x-ray imaging today for evaluation of lytic bone lesions.   -Will re-order ultrasound abdomen to evaluate for liver or spleen disease. He does have some mild abd distension and hepatomegaly on physical exam. Also with history of alcoholism, with continued alcohol consumption, which could also be contributing to his anemia/thrombocytopenia.  -Will re-order myeloma lab evaluation since it has been 2 months since his last evaluation.   -May need to consider obtaining bone marrow biopsy for definitive diagnosis, if peripheral blood work-up is indeterminate.  We did not discuss the  been marrow biopsy today, as patient was very anxious today and appeared overwhelmed with all of the information discussed today.   -Return to cancer center in 1 month for follow-up with Dr. Talbert Cage to review test results and discuss further work-up vs treatment considerations and follow-up plan.     *Patient with history of medical non-compliance to ordered imaging and follow-up visits.  Stressed the importance of adherence to our recommendations so we may be able to determine what may be contributing to his symptoms and lab abnormalities. He agrees with the above plan and states that he will comply with our recommendations.         Dispo:  -Labs and skeletal survey today.  -Return to cancer center in 1 month for follow-up with Dr. Talbert Cage to review results and determine treatment plan.    All questions were answered to patient's stated satisfaction. Encouraged patient to call with any new concerns or questions before his next visit to the cancer center and we can certain see him sooner, if needed.    Plan of care discussed with Dr. Talbert Cage, who agrees with the above aforementioned.  A total of 30 minutes was spent in face-to-face care of this patient, with greater than 50% of that time spent in counseling and care-coordination.     Orders placed this encounter:  Orders Placed This Encounter  Procedures  . DG Bone Survey Met  . US Abdomen Complete  . Vitamin B12  . Folate  . Iron and TIBC  . Ferritin  . CBC with Differential/Platelet  . Comprehensive metabolic panel  . Immunofixation electrophoresis  . IgG, IgA, IgM  . Lactate dehydrogenase  . Protein electrophoresis, serum  . Kappa/lambda light chains      Mike Craze, NP [place] [phone]

## 2017-01-31 NOTE — Patient Instructions (Addendum)
[place] at [facility] Discharge Instructions  RECOMMENDATIONS MADE BY THE CONSULTANT AND ANY TEST RESULTS WILL BE SENT TO YOUR REFERRING PHYSICIAN.  You saw Mike Craze, NP, today  Labs and skeletal survey today- Do not leave without having these done.  [place] abd sometime within the next 1-2 weeks RTC in 1 month for FU with Dr. Talbert Cage.  See Andee Poles at checkout for appointments.   Thank you for choosing [place] at [facility] to provide your oncology and hematology care.  To afford each patient quality time with our provider, please arrive at least 15 minutes before your scheduled appointment time.    If you have a lab appointment with the [facility] please come in thru the  Main Entrance and check in at the main information desk  You need to re-schedule your appointment should you arrive 10 or more minutes late.  We strive to give you quality time with our providers, and arriving late affects you and other patients whose appointments are after yours.  Also, if you no show three or more times for appointments you may be dismissed from the clinic at the providers discretion.     Again, thank you for choosing [facility].  Our hope is that these requests will decrease the amount of time that you wait before being seen by our physicians.       _____________________________________________________________  Should you have questions after your visit to [facility], please contact our office at [phone] between the hours of 8:30 a.m. and 4:30 p.m.  Voicemails left after 4:30 p.m. will not be returned until the following business day.  For prescription refill requests, have your pharmacy contact our office.       Resources For Cancer Patients and their Caregivers ? American Cancer Society: Can assist with transportation, wigs, general needs, runs Look Good Feel Better.        404-273-4270 ? Cancer  Care: Provides financial assistance, online support groups, medication/co-pay assistance.  1-800-813-HOPE (812)229-8880) ? [facility] Assists [place] cancer patients and their families through emotional , educational and financial support.  260-350-9410 ? [place] DSS Where to apply for food stamps, Medicaid and utility assistance. 5018732634 ? [facility]: Transportation to medical appointments. 765-118-6339 ? Social Security Administration: May apply for disability if have a Stage IV cancer. 802-401-7595 3301184135 ? [employer], Disability and Transit Services: Assists with nutrition, care and transit needs. [place] Support Programs: @10RELATIVEDAYS @ > Cancer Support Group  2nd Tuesday of the month 1pm-2pm, Journey Room  > Creative Journey  3rd Tuesday of the month 1130am-1pm, Journey Room  > Look Good Feel Better  1st Wednesday of the month 10am-12 noon, Journey Room (Call [place] to register [phone])

## 2017-02-01 LAB — IGG, IGA, IGM
IGM, SERUM: 277 mg/dL — AB (ref 20–172)
IgA: 891 mg/dL — ABNORMAL HIGH (ref 90–386)
IgG (Immunoglobin G), Serum: 1375 mg/dL (ref 700–1600)

## 2017-02-03 LAB — IMMUNOFIXATION ELECTROPHORESIS
IgA: 902 mg/dL — ABNORMAL HIGH (ref 90–386)
IgG (Immunoglobin G), Serum: 1332 mg/dL (ref 700–1600)
IgM, Serum: 280 mg/dL — ABNORMAL HIGH (ref 20–172)
Total Protein ELP: 7.6 g/dL (ref 6.0–8.5)

## 2017-02-03 LAB — PROTEIN ELECTROPHORESIS, SERUM
A/G RATIO SPE: 1.1 (ref 0.7–1.7)
ALPHA-1-GLOBULIN: 0.3 g/dL (ref 0.0–0.4)
ALPHA-2-GLOBULIN: 0.5 g/dL (ref 0.4–1.0)
Albumin ELP: 3.9 g/dL (ref 2.9–4.4)
Beta Globulin: 1.4 g/dL — ABNORMAL HIGH (ref 0.7–1.3)
GLOBULIN, TOTAL: 3.7 g/dL (ref 2.2–3.9)
Gamma Globulin: 1.5 g/dL (ref 0.4–1.8)
TOTAL PROTEIN ELP: 7.6 g/dL (ref 6.0–8.5)

## 2017-02-03 LAB — KAPPA/LAMBDA LIGHT CHAINS
KAPPA FREE LGHT CHN: 39.9 mg/L — AB (ref 3.3–19.4)
KAPPA, LAMDA LIGHT CHAIN RATIO: 1.09 (ref 0.26–1.65)
LAMDA FREE LIGHT CHAINS: 36.5 mg/L — AB (ref 5.7–26.3)

## 2017-02-05 ENCOUNTER — Other Ambulatory Visit (HOSPITAL_COMMUNITY): Payer: Self-pay | Admitting: Adult Health

## 2017-02-05 DIAGNOSIS — D509 Iron deficiency anemia, unspecified: Secondary | ICD-10-CM | POA: Insufficient documentation

## 2017-02-10 ENCOUNTER — Ambulatory Visit (HOSPITAL_COMMUNITY): Payer: Medicare Other

## 2017-02-12 ENCOUNTER — Ambulatory Visit (HOSPITAL_COMMUNITY): Payer: Medicare Other

## 2017-02-14 ENCOUNTER — Ambulatory Visit (HOSPITAL_COMMUNITY): Admission: RE | Admit: 2017-02-14 | Payer: Medicare Other | Source: Ambulatory Visit

## 2017-03-03 ENCOUNTER — Ambulatory Visit (HOSPITAL_COMMUNITY): Payer: Medicare Other

## 2017-04-25 ENCOUNTER — Ambulatory Visit (HOSPITAL_COMMUNITY)
Admission: RE | Admit: 2017-04-25 | Discharge: 2017-04-25 | Disposition: A | Discharge status: Home / Self Care | Payer: Medicare Other | Source: Ambulatory Visit | Attending: Hematology and Oncology | Admitting: Hematology and Oncology

## 2017-04-25 ENCOUNTER — Encounter (HOSPITAL_COMMUNITY): Payer: Medicare Other | Attending: Oncology | Admitting: Hematology and Oncology

## 2017-04-25 ENCOUNTER — Encounter (HOSPITAL_COMMUNITY): Payer: Self-pay

## 2017-04-25 ENCOUNTER — Encounter (HOSPITAL_COMMUNITY): Payer: Medicare Other

## 2017-04-25 VITALS — BP 121/82 | HR 76 | Resp 18 | Ht 69.5 in | Wt 183.4 lb

## 2017-04-25 DIAGNOSIS — D509 Iron deficiency anemia, unspecified: Secondary | ICD-10-CM

## 2017-04-25 DIAGNOSIS — D696 Thrombocytopenia, unspecified: Secondary | ICD-10-CM | POA: Insufficient documentation

## 2017-04-25 DIAGNOSIS — M5134 Other intervertebral disc degeneration, thoracic region: Secondary | ICD-10-CM | POA: Insufficient documentation

## 2017-04-25 DIAGNOSIS — D61818 Other pancytopenia: Secondary | ICD-10-CM

## 2017-04-25 DIAGNOSIS — M5136 Other intervertebral disc degeneration, lumbar region: Secondary | ICD-10-CM | POA: Insufficient documentation

## 2017-04-25 DIAGNOSIS — D708 Other neutropenia: Secondary | ICD-10-CM

## 2017-04-25 DIAGNOSIS — M19011 Primary osteoarthritis, right shoulder: Secondary | ICD-10-CM | POA: Diagnosis not present

## 2017-04-25 DIAGNOSIS — M19012 Primary osteoarthritis, left shoulder: Secondary | ICD-10-CM | POA: Diagnosis not present

## 2017-04-25 DIAGNOSIS — M170 Bilateral primary osteoarthritis of knee: Secondary | ICD-10-CM | POA: Insufficient documentation

## 2017-04-25 LAB — COMPREHENSIVE METABOLIC PANEL
ALT: 38 U/L (ref 17–63)
AST: 71 U/L — ABNORMAL HIGH (ref 15–41)
Albumin: 4.2 g/dL (ref 3.5–5.0)
Alkaline Phosphatase: 149 U/L — ABNORMAL HIGH (ref 38–126)
Anion gap: 10 (ref 5–15)
BILIRUBIN TOTAL: 1.2 mg/dL (ref 0.3–1.2)
BUN: 9 mg/dL (ref 6–20)
CO2: 28 mmol/L (ref 22–32)
Calcium: 9.3 mg/dL (ref 8.9–10.3)
Chloride: 99 mmol/L — ABNORMAL LOW (ref 101–111)
Creatinine, Ser: 0.73 mg/dL (ref 0.61–1.24)
Glucose, Bld: 95 mg/dL (ref 65–99)
POTASSIUM: 3.7 mmol/L (ref 3.5–5.1)
Sodium: 137 mmol/L (ref 135–145)
TOTAL PROTEIN: 8 g/dL (ref 6.5–8.1)

## 2017-04-25 LAB — LACTATE DEHYDROGENASE: LDH: 178 U/L (ref 98–192)

## 2017-04-26 LAB — BETA 2 MICROGLOBULIN, SERUM: BETA 2 MICROGLOBULIN: 2.3 mg/L (ref 0.6–2.4)

## 2017-04-28 LAB — KAPPA/LAMBDA LIGHT CHAINS
KAPPA FREE LGHT CHN: 38.3 mg/L — AB (ref 3.3–19.4)
Kappa, lambda light chain ratio: 1.01 (ref 0.26–1.65)
LAMDA FREE LIGHT CHAINS: 38 mg/L — AB (ref 5.7–26.3)

## 2017-04-29 LAB — MULTIPLE MYELOMA PANEL, SERUM
ALBUMIN SERPL ELPH-MCNC: 3.8 g/dL (ref 2.9–4.4)
ALBUMIN/GLOB SERPL: 1.1 (ref 0.7–1.7)
ALPHA 1: 0.3 g/dL (ref 0.0–0.4)
ALPHA2 GLOB SERPL ELPH-MCNC: 0.5 g/dL (ref 0.4–1.0)
B-GLOBULIN SERPL ELPH-MCNC: 1.5 g/dL — AB (ref 0.7–1.3)
GLOBULIN, TOTAL: 3.8 g/dL (ref 2.2–3.9)
Gamma Glob SerPl Elph-Mcnc: 1.5 g/dL (ref 0.4–1.8)
IgA: 1009 mg/dL — ABNORMAL HIGH (ref 90–386)
IgG (Immunoglobin G), Serum: 1189 mg/dL (ref 700–1600)
IgM (Immunoglobulin M), Srm: 290 mg/dL — ABNORMAL HIGH (ref 20–172)
Total Protein ELP: 7.6 g/dL (ref 6.0–8.5)

## 2017-04-30 ENCOUNTER — Other Ambulatory Visit (HOSPITAL_COMMUNITY)
Admission: RE | Admit: 2017-04-30 | Discharge: 2017-04-30 | Disposition: A | Discharge status: Home / Self Care | Payer: Medicare Other | Source: Other Acute Inpatient Hospital | Attending: Hematology and Oncology | Admitting: Hematology and Oncology

## 2017-04-30 ENCOUNTER — Other Ambulatory Visit (HOSPITAL_COMMUNITY): Payer: Self-pay

## 2017-04-30 DIAGNOSIS — D509 Iron deficiency anemia, unspecified: Secondary | ICD-10-CM | POA: Diagnosis present

## 2017-04-30 DIAGNOSIS — D708 Other neutropenia: Secondary | ICD-10-CM | POA: Insufficient documentation

## 2017-04-30 DIAGNOSIS — D696 Thrombocytopenia, unspecified: Secondary | ICD-10-CM | POA: Insufficient documentation

## 2017-05-01 LAB — UPEP/TP, 24-HR URINE
Albumin, U: 100 %
Alpha 1, Urine: 0 %
Alpha 2, Urine: 0 %
BETA UR: 0 %
Gamma Globulin, Urine: 0 %
TOTAL PROTEIN, URINE-UPE24: 9.7 mg/dL
TOTAL PROTEIN, URINE-UR/DAY: 131 mg/(24.h) (ref 30–150)
Total Volume: 1350

## 2017-05-01 LAB — UIFE/LIGHT CHAINS/TP QN, 24-HR UR
% BETA, Urine: 0 %
ALPHA 1 URINE: 0 %
ALPHA 2 UR: 0 %
Albumin, U: 100 %
FREE LAMBDA LT CHAINS, UR: 0.13 mg/L — AB (ref 0.24–6.66)
FREE LT CHN EXCR RATE: 1.7 mg/L (ref 1.35–24.19)
Free Kappa/Lambda Ratio: 13.08 — ABNORMAL HIGH (ref 2.04–10.37)
GAMMA GLOBULIN URINE: 0 %
TOTAL PROTEIN, URINE-UPE24: 9.4 mg/dL
TOTAL VOLUME: 1350
Total Protein, Urine-Ur/day: 127 mg/24 hr (ref 30–150)

## 2017-05-02 ENCOUNTER — Encounter (HOSPITAL_COMMUNITY): Payer: Self-pay | Admitting: Lab

## 2017-05-02 ENCOUNTER — Ambulatory Visit (HOSPITAL_COMMUNITY): Payer: Medicare Other

## 2017-05-02 ENCOUNTER — Other Ambulatory Visit (HOSPITAL_COMMUNITY): Payer: Self-pay | Admitting: Oncology

## 2017-05-02 ENCOUNTER — Encounter (HOSPITAL_COMMUNITY): Payer: Self-pay | Admitting: Oncology

## 2017-05-02 ENCOUNTER — Encounter (HOSPITAL_COMMUNITY): Payer: Medicare Other | Attending: Oncology | Admitting: Oncology

## 2017-05-02 VITALS — BP 142/99 | HR 77 | Resp 18 | Ht 69.5 in | Wt 185.5 lb

## 2017-05-02 DIAGNOSIS — D61818 Other pancytopenia: Secondary | ICD-10-CM | POA: Diagnosis present

## 2017-05-02 DIAGNOSIS — G894 Chronic pain syndrome: Secondary | ICD-10-CM | POA: Diagnosis not present

## 2017-05-02 DIAGNOSIS — D696 Thrombocytopenia, unspecified: Secondary | ICD-10-CM | POA: Insufficient documentation

## 2017-05-02 DIAGNOSIS — D708 Other neutropenia: Secondary | ICD-10-CM

## 2017-05-02 NOTE — Progress Notes (Unsigned)
Referral sent to Dr Brien Few @ [facility] for pain/ records faxed on 10/5

## 2017-05-02 NOTE — Assessment & Plan Note (Signed)
56 year old male with pancytopenia and discovered during the last visit and possible setting of monoclonal gammopathy of unknown significance. Patient was missing in action for almost 2 months despite urgent workup needed at the time of the last clinic visit.  At this time, his family's bring him back and assures [place] that he will proceed with the workup for his underlying problem. Differential diagnosis includes multiple myeloma versus myelodysplastic syndrome versus acute myelogenous leukemia.  Plan: --Labs today --Skeletal survey --RTC 1 week to review and plan further evaluation and therapy status

## 2017-05-02 NOTE — Progress Notes (Signed)
Chris Alvarez, [name] [address]   CLINIC:  Medical Oncology/Hematology  PCP:  Octavio Graves, [place] [address] [place] [address] [phone]   REASON FOR VISIT:  Follow-up for Pancytopenia  CURRENT THERAPY: Initial work-up    HISTORY OF PRESENT ILLNESS:  (From [name], PA-C's last note on 11/28/16)       INTERVAL HISTORY:  [name] 56 y.o. male returns for follow-up of pancytopenia.   Patient presents today for continued follow-up of his pancytopenia. He had 24 hour UPEP and IFE done on 04/30/2017 which was negative for monoclonal paraprotein. Repeat IFE on 04/25/2017 demonstrated a polyclonal gammopathy with IgA and IgM, kappa and lambda typing appear increased. CMP on 04/17/2017 demonstrated calcium of 9.3, creatinine 0.73. Kappa light chains 38.3, lambda light chains 38, Kappa/lambda light chain ratio 1.01. Bone survey on 04/25/2017 demonstrated no lytic lesions, thoracic and lumbar spine degenerative changes, bilateral shoulder and bilateral knee degenerative changes. Today patient states that he continues to have chronic fatigue for the past month. He has his chronic pain issues in legs, back, shoulders. He states his appetite is fair. He denies any chest pain, shortness breath, abdominal pain. He states he gets occasional night sweats.   REVIEW OF SYSTEMS:  Review of Systems  Constitutional: Positive for appetite change and fatigue. Negative for chills, diaphoresis, fever and unexpected weight change.  HENT:  Negative.   Eyes: Negative.   Respiratory: Negative.  Negative for cough and shortness of breath.   Cardiovascular: Negative for leg swelling.  Gastrointestinal: Negative.  Negative for abdominal pain, blood in stool, constipation, diarrhea, nausea and vomiting.  Endocrine: Negative.   Genitourinary: Negative.  Negative for dysuria and hematuria.   Musculoskeletal: Positive for arthralgias and back pain. Negative  for myalgias.  Skin: Negative.   Neurological: Positive for numbness.  Hematological: Does not bruise/bleed easily.  Psychiatric/Behavioral: Negative for sleep disturbance. The patient is not nervous/anxious.      PAST MEDICAL/SURGICAL HISTORY:  Past Medical History:  Diagnosis Date  . Depressed   . DM2 (diabetes mellitus, type 2) ([place])   . HLD (hyperlipidemia)   . HTN (hypertension)   . Other pancytopenia ([place]) 11/28/2016  . Thrombocytopenia ([place]) 08/23/2016   Past Surgical History:  Procedure Laterality Date  . knee replacement (other)       SOCIAL HISTORY:  Social History   Social History  . Marital status: Divorced    Spouse name: N/A  . Number of children: N/A  . Years of education: N/A   Occupational History  . Not on file.   Social History Main Topics  . Smoking status: Current Every Day Smoker    Packs/day: 1.00    Years: 42.00    Types: Cigarettes  . Smokeless tobacco: Never Used     Comment: quit 2011  . Alcohol use No     Comment: Quit in Jan 2018 having drank 12 pack of beer daily.  . Drug use: No     Comment: History of cocaine use- [facility] admission 2005.   Marland Kitchen Sexual activity: Not on file   Other Topics Concern  . 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     FAMILY HISTORY:  Family History  Problem Relation Age of Onset  . Hypertension Mother   . Heart Problems Mother   . Hyperlipidemia Mother   . Coronary artery disease Father   . Diabetes Father   .  Cancer Paternal Uncle        stomach    CURRENT MEDICATIONS:  Outpatient Encounter Prescriptions as of 05/02/2017  Medication Sig  . albuterol (PROAIR HFA) 108 (90 BASE) MCG/ACT inhaler Inhale 2 puffs into the lungs every 4 (four) hours as needed for wheezing or shortness of breath. 1-2 puffs every 4-6 hours prn  . ALPRAZolam (XANAX) 1 MG tablet Take 1 mg by mouth 4 (four) times daily as needed for anxiety or sleep.   Marland Kitchen amphetamine-dextroamphetamine (ADDERALL) 5  MG tablet   . aspirin EC 81 MG tablet Take 81 mg by mouth daily.  . fenofibrate 160 MG tablet Take 160 mg by mouth daily.    . furosemide (LASIX) 20 MG tablet Take 30 mg by mouth 2 (two) times daily.   Marland Kitchen lisinopril (PRINIVIL,ZESTRIL) 20 MG tablet Take 20 mg by mouth daily.    . metFORMIN (GLUCOPHAGE) 500 MG tablet Take 500 mg by mouth daily with breakfast.   . omeprazole (PRILOSEC) 20 MG capsule Take 20 mg by mouth daily.    . rosuvastatin (CRESTOR) 10 MG tablet Take 10 mg by mouth daily.     No facility-administered encounter medications on file as of 05/02/2017.     ALLERGIES:  Allergies  Allergen Reactions  . Hydrocodone Bitartrate Er Hives  . Sulfonamide Derivatives Photosensitivity     PHYSICAL EXAM:  ECOG Performance status: 1 - Symptomatic; remains independent.   Vitals:   05/02/17 1104  BP: (!) 142/99  Pulse: 77  Resp: 18  SpO2: 100%   Filed Weights   05/02/17 1104  Weight: 185 lb 8 oz (84.1 kg)    Physical Exam  Constitutional: He is oriented to person, place, and time and well-developed, well-nourished, and in no distress.  HENT:  Head: Normocephalic.  Mouth/Throat: Oropharynx is clear and moist. No oropharyngeal exudate.  Eyes: Pupils are equal, round, and reactive to light. Conjunctivae are normal. No scleral icterus.  Neck: Normal range of motion. Neck supple.  Cardiovascular: Normal rate and regular rhythm.   Pulmonary/Chest: Effort normal. He has no wheezes.  Abdominal: Soft. He exhibits distension (hepatomegaly appreciated on exam. ). There is no tenderness. There is no rebound and no guarding.  Musculoskeletal: Normal range of motion. He exhibits no edema.  Lymphadenopathy:    He has no cervical adenopathy.  Neurological: He is alert and oriented to person, place, and time. No cranial nerve deficit. Gait normal.  Skin: Skin is warm and dry. No erythema.  Psychiatric: Mood, memory, affect and judgment normal.  Nursing note and vitals  reviewed.    LABORATORY DATA:  I have reviewed the labs as listed.  CBC    Component Value Date/Time   WBC 3.3 (L) 01/31/2017 1156   RBC 3.77 (L) 01/31/2017 1156   HGB 11.8 (L) 01/31/2017 1156   HCT 35.0 (L) 01/31/2017 1156   PLT 53 (L) 01/31/2017 1156   MCV 92.8 01/31/2017 1156   MCH 31.3 01/31/2017 1156   MCHC 33.7 01/31/2017 1156   RDW 15.4 01/31/2017 1156   LYMPHSABS 0.4 (L) 01/31/2017 1156   MONOABS 0.4 01/31/2017 1156   EOSABS 0.1 01/31/2017 1156   BASOSABS 0.0 01/31/2017 1156   CMP Latest Ref Rng & Units 04/25/2017 01/31/2017 11/28/2016  Glucose 65 - 99 mg/dL 95 83 119(H)  BUN 6 - 20 mg/dL 9 11 10   Creatinine 0.61 - 1.24 mg/dL 0.73 0.83 0.84  Sodium 135 - 145 mmol/L 137 138 139  Potassium 3.5 - 5.1 mmol/L 3.7  4.1 3.6  Chloride 101 - 111 mmol/L 99(L) 102 110  CO2 22 - 32 mmol/L 28 27 23   Calcium 8.9 - 10.3 mg/dL 9.3 9.8 9.2  Total Protein 6.5 - 8.1 g/dL 8.0 7.9 8.3(H)  Total Bilirubin 0.3 - 1.2 mg/dL 1.2 1.5(H) 1.0  Alkaline Phos 38 - 126 U/L 149(H) 122 141(H)  AST 15 - 41 U/L 71(H) 58(H) 49(H)  ALT 17 - 63 U/L 38 32 30   Results for TARRON, KROLAK (MRN [id number])   Ref. Range 11/28/2016 12:27  Haptoglobin Latest Ref Range: 34 - 200 mg/dL 76  Erythropoietin Latest Ref Range: 2.6 - 18.5 mIU/mL 16.5  Sed Rate Latest Ref Range: 0 - 16 mm/hr 62 (H)   Results for BREKKEN, BEACH (MRN [id number])  Ref. Range 11/28/2016 12:28  Iron Latest Ref Range: 45 - 182 ug/dL 48  UIBC Latest Units: ug/dL 329  TIBC Latest Ref Range: 250 - 450 ug/dL 377  Saturation Ratios Latest Ref Range: 17.9 - 39.5 % 13 (L)  Ferritin Latest Ref Range: 24 - 336 ng/mL 49  Folate Latest Ref Range: >5.9 ng/mL 19.0  Results for KRISTOFER, SCHAFFERT (MRN [id number])   Ref. Range 11/28/2016 12:27 11/28/2016 12:28  Copper Latest Ref Range: 72 - 166 ug/dL 117   CRP Latest Ref Range: <1.0 mg/dL  <0.8  Vitamin B12 Latest Ref Range: 180 - 914 pg/mL  535  Results for VAL, SCHIAVO (MRN [id number])   Ref.  Range 11/28/2016 12:27 11/28/2016 12:28  Total Protein ELP Latest Ref Range: 6.0 - 8.5 g/dL  8.1  Albumin ELP Latest Ref Range: 2.9 - 4.4 g/dL  3.8  Globulin, Total Latest Ref Range: 2.2 - 3.9 g/dL  4.3 (H)  A/G Ratio Latest Ref Range: 0.7 - 1.7   0.9  Alpha-1-Globulin Latest Ref Range: 0.0 - 0.4 g/dL  0.3  Alpha-2-Globulin Latest Ref Range: 0.4 - 1.0 g/dL  0.6  Beta Globulin Latest Ref Range: 0.7 - 1.3 g/dL  1.2  Gamma Globulin Latest Ref Range: 0.4 - 1.8 g/dL  2.3 (H)  M-SPIKE, % Latest Ref Range: Not Observed g/dL  0.5 (H)  SPE Interp. Unknown  Comment  Comment Unknown  Comment  IgG (Immunoglobin G), Serum Latest Ref Range: 700 - 1,600 mg/dL 1,940 (H)   IgA Latest Ref Range: 90 - 386 mg/dL 971 (H)   IgM, Serum Latest Ref Range: 20 - 172 mg/dL 257 (H)   Kappa free light chain Latest Ref Range: 3.3 - 19.4 mg/L 54.7 (H)   Lamda free light chains Latest Ref Range: 5.7 - 26.3 mg/L 41.8 (H)   Kappa, lamda light chain ratio Latest Ref Range: 0.26 - 1.65  1.31    Results for XAN, INGRAHAM (MRN [id number]) as of 01/31/2017 18:41  Ref. Range 11/28/2016 12:27  IgG (Immunoglobin G), Serum Latest Ref Range: 700 - 1,600 mg/dL 1,940 (H)  IgA Latest Ref Range: 90 - 386 mg/dL 971 (H)   Results for MILFRED, KRAMMES (MRN [id number]) as of 01/31/2017 18:41  Ref. Range 11/28/2016 12:27  HCV Ab Latest Ref Range: 0.0 - 0.9 s/co ratio <0.1  Comment: Unknown Comment  HIV 1/2 Antibodies Latest Ref Range: NON REACTIVE  NON REACTIVE  Interpretation (HIV Ag Ab) Unknown A non reactive te...  HIV-1 P24 Antigen - HIV24 Latest Ref Range: NON REACTIVE  NON REACTIVE   PENDING LABS:    DIAGNOSTIC IMAGING:    PATHOLOGY:     ASSESSMENT & PLAN:   Pancytopenia: -  Review results of his myeloma workup in detail with him. He has no evidence of a monoclonal paraprotein in his urine or blood. -I will order an abdominal ultrasound to rule out splenomegaly as a cause of his pancytopenia. -I have discussed with him that  so far are peripheral workup has been nondiagnostic as to the underlying cause of his pancytopenia. I have discussed the importance of proceeding with a bone marrow biopsy for definitive diagnosis, since the bone marrow is the factory were all of his cell lines are being made. Patient is agreeable to proceeding therefore we'll consult IR for CT-guided bone marrow biopsy. -Repeat CBC, CMP on his next visit. -Return to clinic in 3 weeks after bone marrow biopsy results are back to review results.  Chronic pain syndrome: -Referral made to pain management in [place].   Orders placed this encounter:  Orders Placed This Encounter  Procedures  . CT Biopsy  . US Abdomen Complete  . CBC with Differential  . Comprehensive metabolic panel    Twana First, MD

## 2017-05-02 NOTE — Progress Notes (Signed)
[facility] Follow-up Visit:  Assessment: Other pancytopenia ([place]) 56 year old male with pancytopenia and discovered during the last visit and possible setting of monoclonal gammopathy of unknown significance. Patient was missing in action for almost 2 months despite urgent workup needed at the time of the last clinic visit.  At this time, his family's bring him back and assures [place] that he will proceed with the workup for his underlying problem. Differential diagnosis includes multiple myeloma versus myelodysplastic syndrome versus acute myelogenous leukemia.  Plan: --Labs today --Skeletal survey --RTC 1 week to review and plan further evaluation and therapy status  Voice recognition software was used and creation of this note. Despite my best effort at editing the text, some misspelling/errors may have occurred.  Orders Placed This Encounter  Procedures  . DG Bone Survey Met    Standing Status:   Future    Number of Occurrences:   1    Standing Expiration Date:   06/25/2018    Order Specific Question:   Reason for Exam (SYMPTOM  OR DIAGNOSIS REQUIRED)    Answer:   MGUS, possible MM, please eval lytic bone lesions    Order Specific Question:   Preferred imaging location?    Answer:   [facility]    Order Specific Question:   Radiology Contrast Protocol - do NOT remove file path    Answer:   [email or web address]  . CBC & Diff and Retic    Standing Status:   Future    Number of Occurrences:   1    Standing Expiration Date:   04/25/2018  . Morphology    Standing Status:   Future    Number of Occurrences:   1    Standing Expiration Date:   04/25/2018  . Lactate dehydrogenase (LDH)    Standing Status:   Future    Number of Occurrences:   1    Standing Expiration Date:   04/25/2018  . Comprehensive metabolic panel    Standing Status:   Future    Number of Occurrences:   1    Standing Expiration Date:   04/25/2018  .  Beta 2 microglobulin    Standing Status:   Future    Number of Occurrences:   1    Standing Expiration Date:   04/25/2018  . Multiple Myeloma Panel (SPEP&IFE w/QIG)    Standing Status:   Future    Number of Occurrences:   1    Standing Expiration Date:   04/25/2018  . Kappa/lambda light chains    Standing Status:   Future    Number of Occurrences:   1    Standing Expiration Date:   04/25/2018  . 24-Hr Ur UPEP/UIFE/Light Chains/TP    Standing Status:   Future    Number of Occurrences:   1    Standing Expiration Date:   04/25/2018    Cancer Staging No matching staging information was found for the patient.  All questions were answered.  . The patient knows to call the clinic with any problems, questions or concerns.  This note was electronically signed.    History of Presenting Illness Chris Alvarez 56 y.o. presenting to the [facility] for diagnosis of pancytopenia and possible monoclonal gammopathy of unknown significance. Patient was last seen in our clinic in July 2018 and workup lump was arranged for. Patient did not show up for a newly arranged evaluations and did not return to the scheduled clinic appointment at that time.  He now returns to the clinic on insistence of his sister. At the present time, he noticed that back pain has progressed since the last visit to the clinic, he reports hurting pain in the legs bilaterally, he catching in the right hip, chills without night sweats or fevers. He does report regular nausea, decreased appetite, early satiety and weight loss of approximately 60 pounds over the past year. He reports no diarrhea or constipation.  Oncological/hematological History:  No history exists.    Medical History: Past Medical History:  Diagnosis Date  . Depressed   . DM2 (diabetes mellitus, type 2) ([place])   . HLD (hyperlipidemia)   . HTN (hypertension)   . Other pancytopenia ([place]) 11/28/2016  . Thrombocytopenia ([place]) 08/23/2016    Surgical  History: Past Surgical History:  Procedure Laterality Date  . knee replacement (other)      Family History: Family History  Problem Relation Age of Onset  . Hypertension Mother   . Heart Problems Mother   . Hyperlipidemia Mother   . Coronary artery disease Father   . Diabetes Father   . Cancer Paternal Uncle        stomach    Social History: Social History   Social History  . Marital status: Divorced    Spouse name: N/A  . Number of children: N/A  . Years of education: N/A   Occupational History  . Not on file.   Social History Main Topics  . Smoking status: Current Every Day Smoker    Packs/day: 1.00    Years: 42.00    Types: Cigarettes  . Smokeless tobacco: Never Used     Comment: quit 2011  . Alcohol use No     Comment: Quit in Jan 2018 having drank 12 pack of beer daily.  . Drug use: No     Comment: History of cocaine use- [facility] admission 2005.   Marland Kitchen Sexual activity: Not on file   Other Topics Concern  . 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     Allergies: Allergies  Allergen Reactions  . Hydrocodone Bitartrate Er Hives  . Sulfonamide Derivatives Photosensitivity    Medications:  Current Outpatient Prescriptions  Medication Sig Dispense Refill  . albuterol (PROAIR HFA) 108 (90 BASE) MCG/ACT inhaler Inhale 2 puffs into the lungs every 4 (four) hours as needed for wheezing or shortness of breath. 1-2 puffs every 4-6 hours prn    . ALPRAZolam (XANAX) 1 MG tablet Take 1 mg by mouth 4 (four) times daily as needed for anxiety or sleep.     . fenofibrate 160 MG tablet Take 160 mg by mouth daily.      . furosemide (LASIX) 20 MG tablet Take 30 mg by mouth 2 (two) times daily.     Marland Kitchen lisinopril (PRINIVIL,ZESTRIL) 20 MG tablet Take 20 mg by mouth daily.      . metFORMIN (GLUCOPHAGE) 500 MG tablet Take 500 mg by mouth daily with breakfast.     . omeprazole (PRILOSEC) 20 MG capsule Take 20 mg by mouth daily.      . rosuvastatin  (CRESTOR) 10 MG tablet Take 10 mg by mouth daily.      Marland Kitchen amphetamine-dextroamphetamine (ADDERALL) 5 MG tablet     . aspirin EC 81 MG tablet Take 81 mg by mouth daily.     No current facility-administered medications for this visit.     Review of Systems: Review of Systems  Constitutional: Positive for  chills and unexpected weight change.  Gastrointestinal: Positive for nausea.  Musculoskeletal: Positive for arthralgias and back pain.  Neurological: Positive for numbness.  Hematological: Bruises/bleeds easily.  All other systems reviewed and are negative.    PHYSICAL EXAMINATION Blood pressure 121/82, pulse 76, resp. rate 18, height 5' 9.5" (1.765 m), weight 183 lb 6.4 oz (83.2 kg), SpO2 100 %.  ECOG PERFORMANCE STATUS: 2 - Symptomatic, <50% confined to bed  Physical Exam  Constitutional: He is oriented to person, place, and time and well-developed, well-nourished, and in no distress.  HENT:  Head: Normocephalic.  Mouth/Throat: Oropharynx is clear and moist. No oropharyngeal exudate.  Eyes: Pupils are equal, round, and reactive to light. Conjunctivae are normal. No scleral icterus.  Neck: Normal range of motion. Neck supple.  Cardiovascular: Normal rate and regular rhythm.   Pulmonary/Chest: Effort normal. He has wheezes (Expiratory wheezes).  Abdominal: Soft. He exhibits distension (Mild abd distension; hepatomegaly appreciated on exam. ). There is no tenderness. There is no rebound and no guarding.  Hyperactive bowel sounds  Musculoskeletal: Normal range of motion. He exhibits edema (1+ BLE edema (right LE slightly > left LE)).  Lymphadenopathy:    He has no cervical adenopathy.  Neurological: He is alert and oriented to person, place, and time. No cranial nerve deficit. Gait normal.  Skin: Skin is warm and dry. There is erythema (Mild erythema to RLE (h/o cellulitis); no active infection apparent ).  Psychiatric: Mood, memory, affect and judgment normal.  Nursing note and  vitals reviewed.    LABORATORY DATA: I have personally reviewed the data as listed: Appointment on 04/25/2017  Component Date Value Ref Range Status  . LDH 04/25/2017 178  98 - 192 U/L Final  . Sodium 04/25/2017 137  135 - 145 mmol/L Final  . Potassium 04/25/2017 3.7  3.5 - 5.1 mmol/L Final  . Chloride 04/25/2017 99* 101 - 111 mmol/L Final  . CO2 04/25/2017 28  22 - 32 mmol/L Final  . Glucose, Bld 04/25/2017 95  65 - 99 mg/dL Final  . BUN 04/25/2017 9  6 - 20 mg/dL Final  . Creatinine, Ser 04/25/2017 0.73  0.61 - 1.24 mg/dL Final  . Calcium 04/25/2017 9.3  8.9 - 10.3 mg/dL Final  . Total Protein 04/25/2017 8.0  6.5 - 8.1 g/dL Final  . Albumin 04/25/2017 4.2  3.5 - 5.0 g/dL Final  . AST 04/25/2017 71* 15 - 41 U/L Final  . ALT 04/25/2017 38  17 - 63 U/L Final  . Alkaline Phosphatase 04/25/2017 149* 38 - 126 U/L Final  . Total Bilirubin 04/25/2017 1.2  0.3 - 1.2 mg/dL Final  . GFR calc non Af Amer 04/25/2017 >60  >60 mL/min Final  . GFR calc Af Amer 04/25/2017 >60  >60 mL/min Final   Comment: (NOTE) The eGFR has been calculated using the CKD EPI equation. This calculation has not been validated in all clinical situations. eGFR's persistently <60 mL/min signify possible Chronic Kidney Disease.   . Anion gap 04/25/2017 10  5 - 15 Final  . Beta-2 Microglobulin 04/25/2017 2.3  0.6 - 2.4 mg/L Final   Comment: (NOTE) Siemens Immulite 2000 Immunochemiluminometric assay [facility]) Performed At: [facility] [place], [place] [id number] Lindon Romp MD [id number]   . IgG (Immunoglobin G), Serum 04/25/2017 1189  700 - 1,600 mg/dL Final  . IgA 04/25/2017 1009* 90 - 386 mg/dL Final   Comment: (NOTE) Results confirmed on dilution.   . IgM (Immunoglobulin M), Srm 04/25/2017  290* 20 - 172 mg/dL Final  . Total Protein ELP 04/25/2017 7.6  6.0 - 8.5 g/dL Corrected  . Albumin SerPl Elph-Mcnc 04/25/2017 3.8  2.9 - 4.4 g/dL Corrected  . Alpha 1 04/25/2017 0.3  0.0 -  0.4 g/dL Corrected  . Alpha2 Glob SerPl Elph-Mcnc 04/25/2017 0.5  0.4 - 1.0 g/dL Corrected  . B-Globulin SerPl Elph-Mcnc 04/25/2017 1.5* 0.7 - 1.3 g/dL Corrected  . Gamma Glob SerPl Elph-Mcnc 04/25/2017 1.5  0.4 - 1.8 g/dL Corrected  . M Protein SerPl Elph-Mcnc 04/25/2017 Not Observed  Not Observed g/dL Corrected  . Globulin, Total 04/25/2017 3.8  2.2 - 3.9 g/dL Corrected  . Albumin/Glob SerPl 04/25/2017 1.1  0.7 - 1.7 Corrected  . IFE 1 04/25/2017 Comment   Corrected   Comment: (NOTE) An apparent polyclonal gammopathy: IgA and IgM. Kappa and lambda typing appear increased.   . Please Note 04/25/2017 Comment   Corrected   Comment: (NOTE) Protein electrophoresis scan will follow via computer, mail, or courier delivery. Performed At: [facility] [place], [place] [id number] Lindon Romp MD [id number]   . Kappa free light chain 04/25/2017 38.3* 3.3 - 19.4 mg/L Final  . Lamda free light chains 04/25/2017 38.0* 5.7 - 26.3 mg/L Final  . Kappa, lamda light chain ratio 04/25/2017 1.01  0.26 - 1.65 Final   Comment: (NOTE) Performed At: [facility] [place], [place] [id number] Lindon Romp MD [id number]        Ardath Sax, MD

## 2017-05-05 ENCOUNTER — Ambulatory Visit (HOSPITAL_COMMUNITY)
Admission: RE | Admit: 2017-05-05 | Discharge: 2017-05-05 | Disposition: A | Discharge status: Home / Self Care | Payer: Medicare Other | Source: Ambulatory Visit | Attending: Oncology | Admitting: Oncology

## 2017-05-05 DIAGNOSIS — R161 Splenomegaly, not elsewhere classified: Secondary | ICD-10-CM | POA: Insufficient documentation

## 2017-05-05 DIAGNOSIS — K766 Portal hypertension: Secondary | ICD-10-CM | POA: Insufficient documentation

## 2017-05-05 DIAGNOSIS — D708 Other neutropenia: Secondary | ICD-10-CM

## 2017-05-05 DIAGNOSIS — D61818 Other pancytopenia: Secondary | ICD-10-CM

## 2017-05-05 DIAGNOSIS — D696 Thrombocytopenia, unspecified: Secondary | ICD-10-CM

## 2017-05-08 ENCOUNTER — Ambulatory Visit (HOSPITAL_COMMUNITY): Payer: Medicare Other | Admitting: Adult Health

## 2017-05-11 ENCOUNTER — Other Ambulatory Visit: Payer: Self-pay | Admitting: Radiology

## 2017-05-12 ENCOUNTER — Ambulatory Visit (HOSPITAL_COMMUNITY): Admission: RE | Admit: 2017-05-12 | Payer: Medicare Other | Source: Ambulatory Visit

## 2017-05-14 ENCOUNTER — Other Ambulatory Visit: Payer: Self-pay | Admitting: Physician Assistant

## 2017-05-14 ENCOUNTER — Other Ambulatory Visit: Payer: Self-pay | Admitting: Radiology

## 2017-05-15 ENCOUNTER — Ambulatory Visit (HOSPITAL_COMMUNITY)
Admission: RE | Admit: 2017-05-15 | Discharge: 2017-05-15 | Disposition: A | Discharge status: Home / Self Care | Payer: Medicare Other | Source: Ambulatory Visit | Attending: Oncology | Admitting: Oncology

## 2017-05-15 ENCOUNTER — Encounter (HOSPITAL_COMMUNITY): Payer: Self-pay

## 2017-05-15 DIAGNOSIS — E785 Hyperlipidemia, unspecified: Secondary | ICD-10-CM | POA: Diagnosis not present

## 2017-05-15 DIAGNOSIS — R161 Splenomegaly, not elsewhere classified: Secondary | ICD-10-CM | POA: Insufficient documentation

## 2017-05-15 DIAGNOSIS — D61818 Other pancytopenia: Secondary | ICD-10-CM

## 2017-05-15 DIAGNOSIS — D469 Myelodysplastic syndrome, unspecified: Secondary | ICD-10-CM | POA: Insufficient documentation

## 2017-05-15 DIAGNOSIS — D708 Other neutropenia: Secondary | ICD-10-CM | POA: Diagnosis not present

## 2017-05-15 DIAGNOSIS — Z96659 Presence of unspecified artificial knee joint: Secondary | ICD-10-CM | POA: Diagnosis not present

## 2017-05-15 DIAGNOSIS — D890 Polyclonal hypergammaglobulinemia: Secondary | ICD-10-CM | POA: Diagnosis not present

## 2017-05-15 DIAGNOSIS — K7460 Unspecified cirrhosis of liver: Secondary | ICD-10-CM | POA: Insufficient documentation

## 2017-05-15 DIAGNOSIS — Z882 Allergy status to sulfonamides status: Secondary | ICD-10-CM | POA: Insufficient documentation

## 2017-05-15 DIAGNOSIS — E119 Type 2 diabetes mellitus without complications: Secondary | ICD-10-CM | POA: Diagnosis not present

## 2017-05-15 DIAGNOSIS — Z833 Family history of diabetes mellitus: Secondary | ICD-10-CM | POA: Diagnosis not present

## 2017-05-15 DIAGNOSIS — Z79899 Other long term (current) drug therapy: Secondary | ICD-10-CM | POA: Diagnosis not present

## 2017-05-15 DIAGNOSIS — Z7984 Long term (current) use of oral hypoglycemic drugs: Secondary | ICD-10-CM | POA: Diagnosis not present

## 2017-05-15 DIAGNOSIS — F17210 Nicotine dependence, cigarettes, uncomplicated: Secondary | ICD-10-CM | POA: Insufficient documentation

## 2017-05-15 DIAGNOSIS — Z800 Family history of malignant neoplasm of digestive organs: Secondary | ICD-10-CM | POA: Insufficient documentation

## 2017-05-15 DIAGNOSIS — I10 Essential (primary) hypertension: Secondary | ICD-10-CM | POA: Diagnosis not present

## 2017-05-15 DIAGNOSIS — D696 Thrombocytopenia, unspecified: Secondary | ICD-10-CM

## 2017-05-15 DIAGNOSIS — Z8249 Family history of ischemic heart disease and other diseases of the circulatory system: Secondary | ICD-10-CM | POA: Insufficient documentation

## 2017-05-15 DIAGNOSIS — Z7982 Long term (current) use of aspirin: Secondary | ICD-10-CM | POA: Insufficient documentation

## 2017-05-15 DIAGNOSIS — Z885 Allergy status to narcotic agent status: Secondary | ICD-10-CM | POA: Insufficient documentation

## 2017-05-15 LAB — CBC WITH DIFFERENTIAL/PLATELET
Basophils Absolute: 0 10*3/uL (ref 0.0–0.1)
Basophils Relative: 0 %
Eosinophils Absolute: 0.2 10*3/uL (ref 0.0–0.7)
Eosinophils Relative: 6 %
HCT: 35.3 % — ABNORMAL LOW (ref 39.0–52.0)
HEMOGLOBIN: 11.9 g/dL — AB (ref 13.0–17.0)
LYMPHS ABS: 0.5 10*3/uL — AB (ref 0.7–4.0)
LYMPHS PCT: 16 %
MCH: 30.8 pg (ref 26.0–34.0)
MCHC: 33.7 g/dL (ref 30.0–36.0)
MCV: 91.5 fL (ref 78.0–100.0)
Monocytes Absolute: 0.3 10*3/uL (ref 0.1–1.0)
Monocytes Relative: 9 %
NEUTROS PCT: 69 %
Neutro Abs: 2 10*3/uL (ref 1.7–7.7)
Platelets: 58 10*3/uL — ABNORMAL LOW (ref 150–400)
RBC: 3.86 MIL/uL — AB (ref 4.22–5.81)
RDW: 16.4 % — ABNORMAL HIGH (ref 11.5–15.5)
WBC: 2.9 10*3/uL — ABNORMAL LOW (ref 4.0–10.5)

## 2017-05-15 LAB — GLUCOSE, CAPILLARY: Glucose-Capillary: 111 mg/dL — ABNORMAL HIGH (ref 65–99)

## 2017-05-15 LAB — PROTIME-INR
INR: 1.5
PROTHROMBIN TIME: 18 s — AB (ref 11.4–15.2)

## 2017-05-15 MED ORDER — LIDOCAINE HCL (PF) 1 % IJ SOLN
INTRAMUSCULAR | Status: AC | PRN
  Administered 2017-05-15: 20 mL

## 2017-05-15 MED ORDER — MIDAZOLAM HCL 2 MG/2ML IJ SOLN
INTRAMUSCULAR | Status: AC
  Filled 2017-05-15: qty 4

## 2017-05-15 MED ORDER — MIDAZOLAM HCL 2 MG/2ML IJ SOLN
INTRAMUSCULAR | Status: AC | PRN
  Administered 2017-05-15 (×2): 1 mg via INTRAVENOUS

## 2017-05-15 MED ORDER — FENTANYL CITRATE (PF) 100 MCG/2ML IJ SOLN
INTRAMUSCULAR | Status: AC
Start: 2017-05-15 — End: 2017-05-15
  Filled 2017-05-15: qty 4

## 2017-05-15 MED ORDER — SODIUM CHLORIDE 0.9 % IV SOLN
INTRAVENOUS | Status: DC
  Administered 2017-05-15: 07:00:00 via INTRAVENOUS

## 2017-05-15 MED ORDER — FENTANYL CITRATE (PF) 100 MCG/2ML IJ SOLN
INTRAMUSCULAR | Status: AC | PRN
  Administered 2017-05-15 (×2): 50 ug via INTRAVENOUS

## 2017-05-15 NOTE — Consult Note (Signed)
Chief Complaint: Patient was seen in consultation today for CT-guided bone marrow biopsy  Referring Physician(s): Zhou,Louise  Supervising Physician: Markus Daft  Patient Status: [facility] - Out-pt  History of Present Illness: Chris Alvarez is a 56 y.o. male smoker with history of cirrhosis by imaging, splenomegaly, polyclonal gammopathy (no M spike) and pancytopenia who presents today for CT-guided bone marrow biopsy for further evaluation.  Past Medical History:  Diagnosis Date  . Depressed   . DM2 (diabetes mellitus, type 2) ([place])   . HLD (hyperlipidemia)   . HTN (hypertension)   . Other pancytopenia ([place]) 11/28/2016  . Thrombocytopenia ([place]) 08/23/2016    Past Surgical History:  Procedure Laterality Date  . knee replacement (other)      Allergies: Hydrocodone bitartrate er and Sulfonamide derivatives  Medications: Prior to Admission medications   Medication Sig Start Date End Date Taking? Authorizing Provider  ALPRAZolam Duanne Moron) 1 MG tablet Take 1 mg by mouth 4 (four) times daily as needed for anxiety or sleep.    Yes [provider]  fenofibrate 160 MG tablet Take 160 mg by mouth daily.     Yes [provider]  lisinopril (PRINIVIL,ZESTRIL) 20 MG tablet Take 20 mg by mouth daily.     Yes [provider]  metFORMIN (GLUCOPHAGE) 500 MG tablet Take 500 mg by mouth daily with breakfast.    Yes [provider]  omeprazole (PRILOSEC) 20 MG capsule Take 20 mg by mouth daily.     Yes [provider]  rosuvastatin (CRESTOR) 10 MG tablet Take 10 mg by mouth daily.     Yes [provider]  albuterol (PROAIR HFA) 108 (90 BASE) MCG/ACT inhaler Inhale 2 puffs into the lungs every 4 (four) hours as needed for wheezing or shortness of breath. 1-2 puffs every 4-6 hours prn    [provider]  amphetamine-dextroamphetamine (ADDERALL) 5 MG tablet  11/21/16   [provider]  aspirin EC 81 MG tablet Take 81 mg by mouth  daily.    [provider]  furosemide (LASIX) 20 MG tablet Take 30 mg by mouth 2 (two) times daily.  07/12/16   [provider]     Family History  Problem Relation Age of Onset  . Hypertension Mother   . Heart Problems Mother   . Hyperlipidemia Mother   . Coronary artery disease Father   . Diabetes Father   . Cancer Paternal Uncle        stomach    Social History   Social History  . Marital status: Divorced    Spouse name: N/A  . Number of children: N/A  . Years of education: N/A   Social History Main Topics  . Smoking status: Current Every Day Smoker    Packs/day: 1.00    Years: 42.00    Types: Cigarettes  . Smokeless tobacco: Never Used     Comment: quit 2011  . Alcohol use No     Comment: Quit in Jan 2018 having drank 12 pack of beer daily.  . Drug use: No     Comment: History of cocaine use- [facility] admission 2005.   Marland Kitchen Sexual activity: Not Asked   Other Topics Concern  . None   Social History Narrative   Full time- Ecologist, [employer]ator.       Review of Systems denies fever, chest pain, dyspnea, abdominal pain, nausea, vomiting or bleeding. He does have occasional headaches,  intermittent coughing,back pain, night sweats and weight loss.  Vital Signs: BP 115/81   Pulse 70   Temp 98.4 F (36.9 C) (Oral)   Resp 18   Ht 5' 9.5" (1.765 m)   Wt 183 lb 6.4 oz (83.2 kg)   SpO2 100%   BMI 26.70 kg/m   Physical Exam  Awake, alert.Chest clear to auscultation bilaterally. Heart with regular rate and rhythm. Abdomen soft, positive bowel sounds, nontender, splenomegaly;no lower extremity edema  Imaging: US Abdomen Complete  Result Date: 05/05/2017 CLINICAL DATA:  Other neutropenia (HCC) D70.8 (ICD-10-CM) Thrombocytopenia (HCC) D69.6 (ICD-10-CM) Other pancytopenia (HCC) D61.818 (ICD-10-CM) Pancytopenia rule out splenomegaly EXAM: ABDOMEN ULTRASOUND COMPLETE COMPARISON:  None. FINDINGS: Gallbladder: Distended. No stones. Wall is borderline thickened  measuring 3 mm. Common bile duct: Diameter: 2 mm Liver: Coarsened echotexture with generalized increased parenchymal echogenicity. Nodular surface. No discrete mass or focal lesion. Portal vein is patent on color Doppler imaging with normal direction of blood flow towards the liver. IVC: No abnormality visualized. Pancreas: Visualized portion unremarkable. Spleen: Enlarged. It measures 17.5 cm in greatest dimension. Volume calculated at 1,960 cm3. Right Kidney: Length: 14.1. Echogenicity within normal limits. No mass or hydronephrosis visualized. Left Kidney: Length: 14.1. Echogenicity within normal limits. No mass or hydronephrosis visualized. Abdominal aorta: No aneurysm visualized. Other findings: Trace amount of ascites seen adjacent to the liver. IMPRESSION: 1. No acute findings. 2. Appearance of the liver suggests cirrhosis. 3. Splenomegaly. Trace amount of ascites. Findings support portal venous hypertension. Electronically Signed   By: Lajean Manes M.D.   On: 05/05/2017 13:16   Dg Bone Survey Met  Result Date: 04/25/2017 CLINICAL DATA:  Iron deficiency. Neutropenia. Bilateral leg pain for the past month. Clinical concern for multiple myeloma. No history of cancer. EXAM: METASTATIC BONE SURVEY COMPARISON:  None. FINDINGS: Skull:  No lytic lesions. Cervical spine:  No lytic lesions. Thoracic spine:  No lytic lesions.  Degenerative changes. Lumbar spine:  No lytic lesions.  Degenerative changes. Chest:  No lytic lesions.  Clear lungs.  Normal sized heart. Bilateral shoulders: No lytic lesions. Mild bilateral glenohumeral degenerative changes. Bilateral humeri:  No lytic lesions. Bilateral forearms:  No lytic lesions. Pelvis:  No lytic lesions. Bilateral hips:  No lytic lesions. Bilateral femurs: No lytic lesions. Bilateral knee degenerative changes. Bilateral lower legs: No lytic lesions. Bilateral knee degenerative changes. IMPRESSION: 1. No lytic lesions. 2. Thoracic and lumbar spine degenerative  changes. 3. Bilateral shoulder and bilateral knee degenerative changes. Electronically Signed   By: Claudie Revering M.D.   On: 04/25/2017 15:53    Labs:  CBC:  Recent Labs  10/02/16 2034 11/28/16 1227 01/31/17 1156 05/15/17 0717  WBC 3.3* 4.8 3.3* 2.9*  HGB 10.0* 11.3* 11.8* 11.9*  HCT 28.7* 32.7* 35.0* 35.3*  PLT 53* 74* 53* 58*    COAGS:  Recent Labs  05/15/17 0717  INR 1.50    BMP:  Recent Labs  10/02/16 2034 11/28/16 1227 01/31/17 1156 04/25/17 1417  NA 131* 139 138 137  K 3.5 3.6 4.1 3.7  CL 100* 110 102 99*  CO2 24 23 27 28   GLUCOSE 132* 119* 83 95  BUN 9 10 11 9   CALCIUM 8.0* 9.2 9.8 9.3  CREATININE 0.95 0.84 0.83 0.73  GFRNONAA >60 >60 >60 >60  GFRAA >60 >60 >60 >60    LIVER FUNCTION TESTS:  Recent Labs  09/09/16 1048 11/28/16 1227 01/31/17 1156 04/25/17 1417  BILITOT 1.2 1.0 1.5* 1.2  AST 62* 49* 58* 71*  ALT 40 30 32 38  ALKPHOS 187*  141* 122 149*  PROT 6.6 8.3* 7.9 8.0  ALBUMIN 3.0* 3.7 4.1 4.2    TUMOR MARKERS: No results for input(s): AFPTM, CEA, CA199, CHROMGRNA in the last 8760 hours.  Assessment and Plan:  56 y.o. male smoker with history of cirrhosis by imaging, splenomegaly, polyclonal gammopathy (no M spike) and pancytopenia who presents today for CT-guided bone marrow biopsy for further evaluation.Risks and benefits discussed with the patient/sister including, but not limited to bleeding, infection, damage to adjacent structures or low yield requiring additional tests.All of the patient's questions were answered, patient is agreeable to proceed. Consent signed and in chart.     Thank you for this interesting consult.  I greatly enjoyed meeting Chris Alvarez and look forward to participating in their care.  A copy of this report was sent to the requesting provider on this date.  Electronically Signed: D. Rowe Robert, PA-C 05/15/2017, 8:31 AM   I spent a total of 25 minutes  in face to face in clinical consultation,  greater than 50% of which was counseling/coordinating care for CT-guided bone marrow biopsy

## 2017-05-15 NOTE — Procedures (Signed)
CT guided bone marrow biopsy.  2 aspirations and 1 core.  Minimal blood loss and no immediate complication.

## 2017-05-15 NOTE — Discharge Instructions (Signed)
Moderate Conscious Sedation, Adult, Care After °These instructions provide you with information about caring for yourself after your procedure. Your health care provider may also give you more specific instructions. Your treatment has been planned according to current medical practices, but problems sometimes occur. Call your health care provider if you have any problems or questions after your procedure. °What can I expect after the procedure? °After your procedure, it is common: °· To feel sleepy for several hours. °· To feel clumsy and have poor balance for several hours. °· To have poor judgment for several hours. °· To vomit if you eat too soon. ° °Follow these instructions at home: °For at least 24 hours after the procedure: ° °· Do not: °? Participate in activities where you could fall or become injured. °? Drive. °? Use heavy machinery. °? Drink alcohol. °? Take sleeping pills or medicines that cause drowsiness. °? Make important decisions or sign legal documents. °? Take care of children on your own. °· Rest. °Eating and drinking °· Follow the diet recommended by your health care provider. °· If you vomit: °? Drink water, juice, or soup when you can drink without vomiting. °? Make sure you have little or no nausea before eating solid foods. °General instructions °· Have a responsible adult stay with you until you are awake and alert. °· Take over-the-counter and prescription medicines only as told by your health care provider. °· If you smoke, do not smoke without supervision. °· Keep all follow-up visits as told by your health care provider. This is important. °Contact a health care provider if: °· You keep feeling nauseous or you keep vomiting. °· You feel light-headed. °· You develop a rash. °· You have a fever. °Get help right away if: °· You have trouble breathing. °This information is not intended to replace advice given to you by your health care provider. Make sure you discuss any questions you have  with your health care provider. °Document Released: 05/05/2013 Document Revised: 12/18/2015 Document Reviewed: 11/04/2015 °Elsevier Interactive Patient Education © 2018 [employer] °Bone Marrow Aspiration and Bone Marrow Biopsy, Adult, Care After °This sheet gives you information about how to care for yourself after your procedure. Your health care provider may also give you more specific instructions. If you have problems or questions, contact your health care provider. °What can I expect after the procedure? °After the procedure, it is common to have: °· Mild pain and tenderness. °· Swelling. °· Bruising. ° °Follow these instructions at home: °· Take over-the-counter or prescription medicines only as told by your health care provider. °· Do not take baths, swim, or use a hot tub until your health care provider approves. Ask if you can take a shower or have a sponge bath. °· Follow instructions from your health care provider about how to take care of the puncture site. Make sure you: °? Wash your hands with soap and water before you change your bandage (dressing). If soap and water are not available, use hand sanitizer. °? Change your dressing as told by your health care provider. °· Check your puncture site every day for signs of infection. Check for: °? More redness, swelling, or pain. °? More fluid or blood. °? Warmth. °? Pus or a bad smell. °· Return to your normal activities as told by your health care provider. Ask your health care provider what activities are safe for you. °· Do not drive for 24 hours if you were given a medicine to help you relax (sedative). °·   Keep all follow-up visits as told by your health care provider. This is important. °Contact a health care provider if: °· You have more redness, swelling, or pain around the puncture site. °· You have more fluid or blood coming from the puncture site. °· Your puncture site feels warm to the touch. °· You have pus or a bad smell coming from the  puncture site. °· You have a fever. °· Your pain is not controlled with medicine. °This information is not intended to replace advice given to you by your health care provider. Make sure you discuss any questions you have with your health care provider. °Document Released: 02/01/2005 Document Revised: 02/02/2016 Document Reviewed: 12/27/2015 °Elsevier Interactive Patient Education © 2018 [employer] ° °

## 2017-05-26 ENCOUNTER — Ambulatory Visit (HOSPITAL_COMMUNITY): Payer: Medicare Other

## 2017-05-26 ENCOUNTER — Other Ambulatory Visit (HOSPITAL_COMMUNITY): Payer: Medicare Other

## 2017-05-27 ENCOUNTER — Encounter (HOSPITAL_COMMUNITY): Payer: Self-pay | Admitting: Oncology

## 2017-05-27 ENCOUNTER — Encounter (HOSPITAL_COMMUNITY): Payer: Medicare Other

## 2017-05-27 ENCOUNTER — Encounter (HOSPITAL_BASED_OUTPATIENT_CLINIC_OR_DEPARTMENT_OTHER): Payer: Medicare Other | Admitting: Oncology

## 2017-05-27 VITALS — BP 138/82 | HR 68 | Resp 18 | Ht 69.5 in | Wt 192.0 lb

## 2017-05-27 DIAGNOSIS — D61818 Other pancytopenia: Secondary | ICD-10-CM | POA: Diagnosis present

## 2017-05-27 DIAGNOSIS — M898X8 Other specified disorders of bone, other site: Secondary | ICD-10-CM

## 2017-05-27 DIAGNOSIS — R161 Splenomegaly, not elsewhere classified: Secondary | ICD-10-CM

## 2017-05-27 DIAGNOSIS — F1020 Alcohol dependence, uncomplicated: Secondary | ICD-10-CM | POA: Diagnosis not present

## 2017-05-27 DIAGNOSIS — D696 Thrombocytopenia, unspecified: Secondary | ICD-10-CM

## 2017-05-27 DIAGNOSIS — K7460 Unspecified cirrhosis of liver: Secondary | ICD-10-CM

## 2017-05-27 DIAGNOSIS — D708 Other neutropenia: Secondary | ICD-10-CM

## 2017-05-27 LAB — COMPREHENSIVE METABOLIC PANEL
ALBUMIN: 3.7 g/dL (ref 3.5–5.0)
ALK PHOS: 119 U/L (ref 38–126)
ALT: 26 U/L (ref 17–63)
ANION GAP: 4 — AB (ref 5–15)
AST: 48 U/L — ABNORMAL HIGH (ref 15–41)
BILIRUBIN TOTAL: 1.2 mg/dL (ref 0.3–1.2)
BUN: 8 mg/dL (ref 6–20)
CALCIUM: 9.2 mg/dL (ref 8.9–10.3)
CO2: 28 mmol/L (ref 22–32)
CREATININE: 0.73 mg/dL (ref 0.61–1.24)
Chloride: 106 mmol/L (ref 101–111)
GFR calc non Af Amer: >60 mL/min (ref >60)
GLUCOSE: 135 mg/dL — AB (ref 65–99)
Potassium: 4.9 mmol/L (ref 3.5–5.1)
Sodium: 138 mmol/L (ref 135–145)
TOTAL PROTEIN: 7.1 g/dL (ref 6.5–8.1)

## 2017-05-27 LAB — CBC WITH DIFFERENTIAL/PLATELET
Basophils Absolute: 0 10*3/uL (ref 0.0–0.1)
Basophils Relative: 0 %
Eosinophils Absolute: 0.1 10*3/uL (ref 0.0–0.7)
Eosinophils Relative: 4 %
HEMATOCRIT: 33.7 % — AB (ref 39.0–52.0)
HEMOGLOBIN: 11.2 g/dL — AB (ref 13.0–17.0)
Lymphocytes Relative: 15 %
Lymphs Abs: 0.4 10*3/uL — ABNORMAL LOW (ref 0.7–4.0)
MCH: 30.6 pg (ref 26.0–34.0)
MCHC: 33.2 g/dL (ref 30.0–36.0)
MCV: 92.1 fL (ref 78.0–100.0)
MONO ABS: 0.3 10*3/uL (ref 0.1–1.0)
MONOS PCT: 13 %
Neutro Abs: 1.7 10*3/uL (ref 1.7–7.7)
Neutrophils Relative %: 68 %
Platelets: 41 10*3/uL — ABNORMAL LOW (ref 150–400)
RBC: 3.66 MIL/uL — ABNORMAL LOW (ref 4.22–5.81)
RDW: 16.1 % — ABNORMAL HIGH (ref 11.5–15.5)
WBC: 2.6 10*3/uL — ABNORMAL LOW (ref 4.0–10.5)

## 2017-05-27 NOTE — Progress Notes (Signed)
Chris Alvarez, [place] [address]   CLINIC:  Medical Oncology/Hematology  PCP:  Octavio Graves, [address] [place] [address] [phone]   REASON FOR VISIT:  Follow-up for Pancytopenia  CURRENT THERAPY: Initial work-up    HISTORY OF PRESENT ILLNESS:  (From [name], PA-C's last note on 11/28/16)       INTERVAL HISTORY:  Chris Alvarez 56 y.o. male returns for follow-up of pancytopenia.   Patient presents today for continued follow-up of his pancytopenia. He had 24 hour UPEP and IFE done on 04/30/2017 which was negative for monoclonal paraprotein. Repeat IFE on 04/25/2017 demonstrated a polyclonal gammopathy with IgA and IgM, kappa and lambda typing appear increased. CMP on 04/17/2017 demonstrated calcium of 9.3, creatinine 0.73. Kappa light chains 38.3, lambda light chains 38, Kappa/lambda light chain ratio 1.01. Bone survey on 04/25/2017 demonstrated no lytic lesions, thoracic and lumbar spine degenerative changes, bilateral shoulder and bilateral knee degenerative changes. Today patient states that he continues to have chronic fatigue for the past month. He has his chronic pain issues in legs, back, shoulders. He states his appetite is fair. He denies any chest pain, shortness breath, abdominal pain. He states he gets occasional night sweats.   05/27/17: Patient presents today for follow-up.  He states that he has been doing well overall except for his chronic issues.  He has chronic pain in his back and legs which are worse at night.  He also states that he bruises easily.  He also has neuropathy in his toes.  He denies any fatigue, chest pain, shortness of breath, abdominal pain.  REVIEW OF SYSTEMS:  Review of Systems  Constitutional: Negative for appetite change, chills, diaphoresis, fatigue, fever and unexpected weight change.  HENT:  Negative.   Eyes: Negative.   Respiratory: Negative.  Negative for cough and shortness of  breath.   Cardiovascular: Negative for leg swelling.  Gastrointestinal: Negative.  Negative for abdominal pain, blood in stool, constipation, diarrhea, nausea and vomiting.  Endocrine: Negative.   Genitourinary: Negative.  Negative for dysuria and hematuria.   Musculoskeletal: Positive for arthralgias and back pain. Negative for myalgias.  Skin: Negative.   Neurological: Positive for numbness.  Hematological: Does not bruise/bleed easily.  Psychiatric/Behavioral: Negative for sleep disturbance. The patient is not nervous/anxious.      PAST MEDICAL/SURGICAL HISTORY:  Past Medical History:  Diagnosis Date  . Depressed   . DM2 (diabetes mellitus, type 2) ([place])   . HLD (hyperlipidemia)   . HTN (hypertension)   . Other pancytopenia ([place]) 11/28/2016  . Thrombocytopenia ([place]) 08/23/2016   Past Surgical History:  Procedure Laterality Date  . knee replacement (other)       SOCIAL HISTORY:  Social History   Social History  . Marital status: Divorced    Spouse name: N/A  . Number of children: N/A  . Years of education: N/A   Occupational History  . Not on file.   Social History Main Topics  . Smoking status: Current Every Day Smoker    Packs/day: 1.00    Years: 42.00    Types: Cigarettes  . Smokeless tobacco: Never Used     Comment: quit 2011  . Alcohol use No     Comment: Quit in Jan 2018 having drank 12 pack of beer daily.  . Drug use: No     Comment: History of cocaine use- [facility] admission 2005.   Marland Kitchen Sexual activity: Not on file  Other Topics Concern  . 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     FAMILY HISTORY:  Family History  Problem Relation Age of Onset  . Hypertension Mother   . Heart Problems Mother   . Hyperlipidemia Mother   . Coronary artery disease Father   . Diabetes Father   . Cancer Paternal Uncle        stomach    CURRENT MEDICATIONS:  Outpatient Encounter Prescriptions as of 05/27/2017  Medication Sig Note    . albuterol (PROAIR HFA) 108 (90 BASE) MCG/ACT inhaler Inhale 2 puffs into the lungs every 4 (four) hours as needed for wheezing or shortness of breath. 1-2 puffs every 4-6 hours prn   . amphetamine-dextroamphetamine (ADDERALL) 5 MG tablet  05/15/2017: Patient not taking  . fenofibrate 160 MG tablet Take 160 mg by mouth daily.     . furosemide (LASIX) 20 MG tablet Take 30 mg by mouth 2 (two) times daily.    Marland Kitchen lisinopril (PRINIVIL,ZESTRIL) 20 MG tablet Take 20 mg by mouth daily.     . metFORMIN (GLUCOPHAGE) 500 MG tablet Take 500 mg by mouth daily with breakfast.    . omeprazole (PRILOSEC) 20 MG capsule Take 20 mg by mouth daily.     . rosuvastatin (CRESTOR) 10 MG tablet Take 10 mg by mouth daily.     Marland Kitchen ALPRAZolam (XANAX) 1 MG tablet Take 1 mg by mouth 4 (four) times daily as needed for anxiety or sleep.    Marland Kitchen aspirin EC 81 MG tablet Take 81 mg by mouth daily.    No facility-administered encounter medications on file as of 05/27/2017.     ALLERGIES:  Allergies  Allergen Reactions  . Hydrocodone Bitartrate Er Hives  . Sulfonamide Derivatives Photosensitivity     PHYSICAL EXAM:  ECOG Performance status: 1 - Symptomatic; remains independent.   Vitals:   05/27/17 0936  BP: 138/82  Pulse: 68  Resp: 18  SpO2: 100%   Filed Weights   05/27/17 0936  Weight: 192 lb (87.1 kg)    Physical Exam  Constitutional: He is oriented to person, place, and time and well-developed, well-nourished, and in no distress.  HENT:  Head: Normocephalic.  Mouth/Throat: Oropharynx is clear and moist. No oropharyngeal exudate.  Eyes: Pupils are equal, round, and reactive to light. Conjunctivae are normal. No scleral icterus.  Neck: Normal range of motion. Neck supple.  Cardiovascular: Normal rate and regular rhythm.   Pulmonary/Chest: Effort normal. He has no wheezes.  Abdominal: Soft. He exhibits distension (hepatomegaly appreciated on exam. ). There is no tenderness. There is no rebound and no  guarding.  Musculoskeletal: Normal range of motion. He exhibits no edema.  Lymphadenopathy:    He has no cervical adenopathy.  Neurological: He is alert and oriented to person, place, and time. No cranial nerve deficit. Gait normal.  Skin: Skin is warm and dry. No erythema.  Psychiatric: Mood, memory, affect and judgment normal.  Nursing note and vitals reviewed.    LABORATORY DATA:  I have reviewed the labs as listed.  CBC    Component Value Date/Time   WBC 2.6 (L) 05/27/2017 0859   RBC 3.66 (L) 05/27/2017 0859   HGB 11.2 (L) 05/27/2017 0859   HCT 33.7 (L) 05/27/2017 0859   PLT PENDING 05/27/2017 0859   MCV 92.1 05/27/2017 0859   MCH 30.6 05/27/2017 0859   MCHC 33.2 05/27/2017 0859   RDW 16.1 (H) 05/27/2017 0859   LYMPHSABS  0.4 (L) 05/27/2017 0859   MONOABS 0.3 05/27/2017 0859   EOSABS 0.1 05/27/2017 0859   BASOSABS 0.0 05/27/2017 0859   CMP Latest Ref Rng & Units 05/27/2017 04/25/2017 01/31/2017  Glucose 65 - 99 mg/dL 135(H) 95 83  BUN 6 - 20 mg/dL 8 9 11   Creatinine 0.61 - 1.24 mg/dL 0.73 0.73 0.83  Sodium 135 - 145 mmol/L 138 137 138  Potassium 3.5 - 5.1 mmol/L 4.9 3.7 4.1  Chloride 101 - 111 mmol/L 106 99(L) 102  CO2 22 - 32 mmol/L 28 28 27   Calcium 8.9 - 10.3 mg/dL 9.2 9.3 9.8  Total Protein 6.5 - 8.1 g/dL 7.1 8.0 7.9  Total Bilirubin 0.3 - 1.2 mg/dL 1.2 1.2 1.5(H)  Alkaline Phos 38 - 126 U/L 119 149(H) 122  AST 15 - 41 U/L 48(H) 71(H) 58(H)  ALT 17 - 63 U/L 26 38 32   Results for Chris Alvarez, Chris Alvarez (MRN [id number])   Ref. Range 11/28/2016 12:27  Haptoglobin Latest Ref Range: 34 - 200 mg/dL 76  Erythropoietin Latest Ref Range: 2.6 - 18.5 mIU/mL 16.5  Sed Rate Latest Ref Range: 0 - 16 mm/hr 62 (H)   Results for Chris Alvarez, Chris Alvarez (MRN [id number])  Ref. Range 11/28/2016 12:28  Iron Latest Ref Range: 45 - 182 ug/dL 48  UIBC Latest Units: ug/dL 329  TIBC Latest Ref Range: 250 - 450 ug/dL 377  Saturation Ratios Latest Ref Range: 17.9 - 39.5 % 13 (L)  Ferritin Latest  Ref Range: 24 - 336 ng/mL 49  Folate Latest Ref Range: >5.9 ng/mL 19.0  Results for Chris Alvarez, Chris Alvarez (MRN [id number])   Ref. Range 11/28/2016 12:27 11/28/2016 12:28  Copper Latest Ref Range: 72 - 166 ug/dL 117   CRP Latest Ref Range: <1.0 mg/dL  <0.8  Vitamin B12 Latest Ref Range: 180 - 914 pg/mL  535  Results for Chris Alvarez, Chris Alvarez (MRN [id number])   Ref. Range 11/28/2016 12:27 11/28/2016 12:28  Total Protein ELP Latest Ref Range: 6.0 - 8.5 g/dL  8.1  Albumin ELP Latest Ref Range: 2.9 - 4.4 g/dL  3.8  Globulin, Total Latest Ref Range: 2.2 - 3.9 g/dL  4.3 (H)  A/G Ratio Latest Ref Range: 0.7 - 1.7   0.9  Alpha-1-Globulin Latest Ref Range: 0.0 - 0.4 g/dL  0.3  Alpha-2-Globulin Latest Ref Range: 0.4 - 1.0 g/dL  0.6  Beta Globulin Latest Ref Range: 0.7 - 1.3 g/dL  1.2  Gamma Globulin Latest Ref Range: 0.4 - 1.8 g/dL  2.3 (H)  M-SPIKE, % Latest Ref Range: Not Observed g/dL  0.5 (H)  SPE Interp. Unknown  Comment  Comment Unknown  Comment  IgG (Immunoglobin G), Serum Latest Ref Range: 700 - 1,600 mg/dL 1,940 (H)   IgA Latest Ref Range: 90 - 386 mg/dL 971 (H)   IgM, Serum Latest Ref Range: 20 - 172 mg/dL 257 (H)   Kappa free light chain Latest Ref Range: 3.3 - 19.4 mg/L 54.7 (H)   Lamda free light chains Latest Ref Range: 5.7 - 26.3 mg/L 41.8 (H)   Kappa, lamda light chain ratio Latest Ref Range: 0.26 - 1.65  1.31    Results for Chris Alvarez, Chris Alvarez (MRN [id number]) as of 01/31/2017 18:41  Ref. Range 11/28/2016 12:27  IgG (Immunoglobin G), Serum Latest Ref Range: 700 - 1,600 mg/dL 1,940 (H)  IgA Latest Ref Range: 90 - 386 mg/dL 971 (H)   Results for Chris Alvarez, Chris Alvarez (MRN [id number]) as of 01/31/2017 18:41  Ref. Range 11/28/2016  12:27  HCV Ab Latest Ref Range: 0.0 - 0.9 s/co ratio <0.1  Comment: Unknown Comment  HIV 1/2 Antibodies Latest Ref Range: NON REACTIVE  NON REACTIVE  Interpretation (HIV Ag Ab) Unknown A non reactive te...  HIV-1 P24 Antigen - HIV24 Latest Ref Range: NON REACTIVE  NON REACTIVE    PENDING LABS:    DIAGNOSTIC IMAGING:  US Abdomen Complete (Accession [id number]) (Order [id number])  Imaging  Date: 05/05/2017 Department: [facility] Released By: Chris Alvarez Authorizing: Chris First, Chris Alvarez  Exam Information   Status Exam Begun  Exam Ended   Final [99] 05/05/2017 12:26 PM 05/05/2017 1:11 PM  PACS Images   Show images for US Abdomen Complete  Study Result   CLINICAL DATA:  Other neutropenia ([place]) D70.8 (ICD-10-CM) Thrombocytopenia ([facility]) D69.6 (ICD-10-CM) Other pancytopenia ([facility]) D61.818 (ICD-10-CM)  Pancytopenia rule out splenomegaly  EXAM: ABDOMEN ULTRASOUND COMPLETE  COMPARISON:  None.  FINDINGS: Gallbladder: Distended. No stones. Wall is borderline thickened measuring 3 mm.  Common bile duct: Diameter: 2 mm  Liver: Coarsened echotexture with generalized increased parenchymal echogenicity. Nodular surface. No discrete mass or focal lesion. Portal vein is patent on color Doppler imaging with normal direction of blood flow towards the liver.  IVC: No abnormality visualized.  Pancreas: Visualized portion unremarkable.  Spleen: Enlarged. It measures 17.5 cm in greatest dimension. Volume calculated at 1,960 cm3.  Right Kidney: Length: 14.1. Echogenicity within normal limits. No mass or hydronephrosis visualized.  Left Kidney: Length: 14.1. Echogenicity within normal limits. No mass or hydronephrosis visualized.  Abdominal aorta: No aneurysm visualized.  Other findings: Trace amount of ascites seen adjacent to the liver.  IMPRESSION: 1. No acute findings. 2. Appearance of the liver suggests cirrhosis. 3. Splenomegaly. Trace amount of ascites. Findings support portal venous hypertension.   Electronically Signed   By: Lajean Manes M.D.   On: 05/05/2017 13:16     PATHOLOGY:   BOE MARROW REPORT FINAL DIAGNOSIS Diagnosis Bone Marrow, Aspirate,Biopsy, and Clot, right ilium BONE MARROW: - MILDLY  HYPERCELLULAR MARROW (60%) WITH DYSMEGAKARYOCYTOPOIESIS - SEE COMMENT PERIPHERAL BLOOD: - PANCYTOPENIA - SEE COMPLETE BLOOD COUNT Diagnosis Note There is mild dysmegakaryocytopoiesis present in the examined marrow which raises the possibility of a low grade myelodysplastic syndrome with minimal cytologic atypia. Non-clonal causes of dysplasia must be excluded before the diagnosis of myelodysplasia is established. These include, but are not limited to, drug and toxin exposure, growth factor therapy, viral infections, immunologic disorders, and nutritional deficiencies (e.g., B12, copper, etc.). Correlation with cytogenetics/FISH is recommended. Thressa Sheller Chris Alvarez Pathologist, Electronic  Cytogenetics: normal male karyotype with no observable clonal chromosomal abnormalities.  ASSESSMENT & PLAN:   Pancytopenia multifactorial from splenomegaly and also from dysplasia possibly due to chronic alcohol use: -I have reviewed patient's bone marrow biopsy results as well as his abdominal ultrasound results with him in detail.    -He does have evidence of liver cirrhosis with splenomegaly with spleen is 17.5 cm.    -Bone marrow biopsy displays mild dysmegakaryocytopoiesis present in the examined marrow which raises the possibility of a low grade myelodysplastic syndrome with minimal cytologic atypia.  Cytogenetics were normal.  FISH is pending.  I suspect that the dysplasia may be due to chronic toxin exposure with alcohol.  Patient states that he drinks a 12 pack of beer a day.  I have highly recommended for him to abstain from alcohol from now on since he does have evidence of liver cirrhosis with splenomegaly as well as dysplasia in his bone  marrow.  Patient verbalized understanding.    -Continue observation for now.    -Return to clinic in 4 months with repeat CBC, CMP.   Orders placed this encounter:  Orders Placed This Encounter  Procedures  . CBC with Differential  . Comprehensive metabolic  panel    Chris First, Chris Alvarez

## 2017-06-03 ENCOUNTER — Telehealth (HOSPITAL_COMMUNITY): Payer: Self-pay

## 2017-06-03 DIAGNOSIS — G8929 Other chronic pain: Secondary | ICD-10-CM

## 2017-06-03 DIAGNOSIS — D696 Thrombocytopenia, unspecified: Secondary | ICD-10-CM

## 2017-06-03 DIAGNOSIS — D61818 Other pancytopenia: Secondary | ICD-10-CM

## 2017-06-03 NOTE — Telephone Encounter (Signed)
Patient called and left message stating we had referred him to the wrong type of pain clinic. He states we sent him to a "neurosurgeon" that only does pain injections. He wants to be referred to a "pain doctor that will write my prescription that I have been on for 11 years." Reviewed chart. Cannot find where we referred him to a pain clinic. Called patient back and left message asking when we referred him and if he had any particular pain clinic in mind. Waiting on patient to return call.

## 2017-06-04 ENCOUNTER — Telehealth (HOSPITAL_COMMUNITY): Payer: Self-pay

## 2017-06-04 NOTE — Addendum Note (Signed)
Addended by: Jaynie Collins R on: 06/04/2017 10:13 AM   Modules accepted: Orders

## 2017-06-04 NOTE — Telephone Encounter (Signed)
Erroneous encounter. Please Disregard.  

## 2017-06-04 NOTE — Telephone Encounter (Signed)
Patient called back and states he would like to be referred to Dr. Suella Broad at [facility]. Reviewed with MD, referral entered.

## 2017-06-06 NOTE — Addendum Note (Signed)
Addended by: Jaynie Collins R on: 06/06/2017 03:45 PM   Modules accepted: Orders

## 2017-06-06 NOTE — Telephone Encounter (Signed)
Patient called back and said that he does not want to be referred to [place] for pain management. I explained to him that the MD he requested, Dr. Suella Broad, is at [facility]. He stated that he wants to go to [facility] and said the phone number is [phone]. I tried calling them and it would ring once and then go busy. He states they are closed on Friday. New referral entered for this pain clinic.

## 2017-06-09 ENCOUNTER — Telehealth (HOSPITAL_COMMUNITY): Payer: Self-pay | Admitting: Oncology

## 2017-06-09 NOTE — Telephone Encounter (Signed)
Patient requested a new referral to [facility].  Patient records for referral faxed to [phone].

## 2017-06-10 ENCOUNTER — Encounter (HOSPITAL_COMMUNITY): Payer: Self-pay

## 2017-06-11 LAB — TISSUE HYBRIDIZATION (BONE MARROW)-NCBH

## 2017-06-11 LAB — CHROMOSOME ANALYSIS, BONE MARROW

## 2017-06-23 ENCOUNTER — Other Ambulatory Visit: Payer: Self-pay

## 2017-06-23 ENCOUNTER — Encounter (HOSPITAL_COMMUNITY): Payer: Self-pay | Admitting: Emergency Medicine

## 2017-06-23 ENCOUNTER — Emergency Department (HOSPITAL_COMMUNITY)
Admission: EM | Admit: 2017-06-23 | Discharge: 2017-06-23 | Disposition: A | Discharge status: Home / Self Care | Payer: Medicare Other | Attending: Emergency Medicine | Admitting: Emergency Medicine

## 2017-06-23 DIAGNOSIS — D509 Iron deficiency anemia, unspecified: Secondary | ICD-10-CM | POA: Diagnosis not present

## 2017-06-23 DIAGNOSIS — D61818 Other pancytopenia: Secondary | ICD-10-CM

## 2017-06-23 DIAGNOSIS — E1165 Type 2 diabetes mellitus with hyperglycemia: Secondary | ICD-10-CM | POA: Insufficient documentation

## 2017-06-23 DIAGNOSIS — R739 Hyperglycemia, unspecified: Secondary | ICD-10-CM

## 2017-06-23 DIAGNOSIS — Z79899 Other long term (current) drug therapy: Secondary | ICD-10-CM | POA: Diagnosis not present

## 2017-06-23 DIAGNOSIS — Z7982 Long term (current) use of aspirin: Secondary | ICD-10-CM | POA: Insufficient documentation

## 2017-06-23 DIAGNOSIS — F17210 Nicotine dependence, cigarettes, uncomplicated: Secondary | ICD-10-CM | POA: Diagnosis not present

## 2017-06-23 DIAGNOSIS — K7031 Alcoholic cirrhosis of liver with ascites: Secondary | ICD-10-CM | POA: Diagnosis not present

## 2017-06-23 DIAGNOSIS — R1084 Generalized abdominal pain: Secondary | ICD-10-CM | POA: Diagnosis present

## 2017-06-23 DIAGNOSIS — Z7984 Long term (current) use of oral hypoglycemic drugs: Secondary | ICD-10-CM | POA: Diagnosis not present

## 2017-06-23 DIAGNOSIS — I10 Essential (primary) hypertension: Secondary | ICD-10-CM | POA: Diagnosis not present

## 2017-06-23 HISTORY — DX: Unspecified cirrhosis of liver: K74.60

## 2017-06-23 LAB — CBC
HEMATOCRIT: 32.9 % — AB (ref 39.0–52.0)
HEMOGLOBIN: 10.5 g/dL — AB (ref 13.0–17.0)
MCH: 30.2 pg (ref 26.0–34.0)
MCHC: 31.9 g/dL (ref 30.0–36.0)
MCV: 94.5 fL (ref 78.0–100.0)
Platelets: 46 10*3/uL — ABNORMAL LOW (ref 150–400)
RBC: 3.48 MIL/uL — AB (ref 4.22–5.81)
RDW: 16.4 % — ABNORMAL HIGH (ref 11.5–15.5)
WBC: 2.9 10*3/uL — ABNORMAL LOW (ref 4.0–10.5)

## 2017-06-23 LAB — URINALYSIS, ROUTINE W REFLEX MICROSCOPIC
BILIRUBIN URINE: NEGATIVE
GLUCOSE, UA: 50 mg/dL — AB
HGB URINE DIPSTICK: NEGATIVE
Ketones, ur: 5 mg/dL — AB
Leukocytes, UA: NEGATIVE
Nitrite: NEGATIVE
PH: 5 (ref 5.0–8.0)
Protein, ur: NEGATIVE mg/dL
SPECIFIC GRAVITY, URINE: 1.028 (ref 1.005–1.030)

## 2017-06-23 LAB — COMPREHENSIVE METABOLIC PANEL
ALBUMIN: 3.4 g/dL — AB (ref 3.5–5.0)
ALK PHOS: 119 U/L (ref 38–126)
ALT: 25 U/L (ref 17–63)
ANION GAP: 5 (ref 5–15)
AST: 50 U/L — AB (ref 15–41)
BUN: 12 mg/dL (ref 6–20)
CHLORIDE: 105 mmol/L (ref 101–111)
CO2: 24 mmol/L (ref 22–32)
Calcium: 8.6 mg/dL — ABNORMAL LOW (ref 8.9–10.3)
Creatinine, Ser: 1.15 mg/dL (ref 0.61–1.24)
GFR calc non Af Amer: >60 mL/min (ref >60)
GLUCOSE: 167 mg/dL — AB (ref 65–99)
Potassium: 3.9 mmol/L (ref 3.5–5.1)
SODIUM: 134 mmol/L — AB (ref 135–145)
Total Bilirubin: 1 mg/dL (ref 0.3–1.2)
Total Protein: 6.7 g/dL (ref 6.5–8.1)

## 2017-06-23 LAB — LIPASE, BLOOD: LIPASE: 24 U/L (ref 11–51)

## 2017-06-23 MED ORDER — SPIRONOLACTONE 25 MG PO TABS: 25.0000 mg | ORAL_TABLET | Freq: Every day | ORAL | 0 refills | Status: DC

## 2017-06-23 NOTE — ED Provider Notes (Signed)
[facility] EMERGENCY DEPARTMENT Provider Note   CSN: [id number] Arrival date & time: 06/23/17  1504     History   Chief Complaint Chief Complaint  Patient presents with  . Abdominal Pain    HPI Chris Alvarez is a 56 y.o. male.  The patient presents for evaluation of abdominal swelling for 3 weeks.  He also has mild discomfort described his pain in the abdomen.  He denies nausea, vomiting, fever, chills, shortness of breath, leg pain or back pain.  He has noticed his weight increased from 183 pounds, to 200 pounds, over the last 3 months.  He is being evaluated and treated by hematology, for pancytopenia, and during the workup had ultrasound showing cirrhosis with ascites.  He was instructed to cut back on alcohol.  He has successfully decreased his alcohol intake, beer, from a 12 pack a day to a 6 pack a week.  He is not currently taking diuretic medication.  He also stopped taking lisinopril.  There are no other known modifying factors.  HPI  Past Medical History:  Diagnosis Date  . Cirrhosis ([place])   . Depressed   . DM2 (diabetes mellitus, type 2) ([place])   . HLD (hyperlipidemia)   . HTN (hypertension)   . Other pancytopenia ([place]) 11/28/2016  . Thrombocytopenia ([name]) 08/23/2016    Patient Active Problem List   Diagnosis Date Noted  . Iron deficiency anemia 02/05/2017  . Other pancytopenia ([place]) 11/28/2016  . Hypokalemia 08/23/2016  . Leukopenia 08/23/2016  . Thrombocytopenia ([place]) 08/23/2016  . Cellulitis of leg, right 08/22/2016  . Type 2 diabetes mellitus with hemoglobin A1c goal of less than 7.0% ([place]) 08/22/2016  . Hypertension 08/22/2016  . Tobacco dependence 08/22/2016  . Cellulitis of right lower leg 08/22/2016  . HYPERLIPIDEMIA-MIXED 09/17/2010  . TOBACCO ABUSE 09/17/2010    Past Surgical History:  Procedure Laterality Date  . knee replacement (other)         Home Medications    Prior to Admission medications   Medication Sig Start Date End Date  Taking? Authorizing Provider  albuterol (PROAIR HFA) 108 (90 BASE) MCG/ACT inhaler Inhale 2 puffs into the lungs every 4 (four) hours as needed for wheezing or shortness of breath. 1-2 puffs every 4-6 hours prn    [provider]  ALPRAZolam (XANAX) 1 MG tablet Take 1 mg by mouth 4 (four) times daily as needed for anxiety or sleep.     [provider]  amphetamine-dextroamphetamine (ADDERALL) 5 MG tablet  11/21/16   [provider]  aspirin EC 81 MG tablet Take 81 mg by mouth daily.    [provider]  fenofibrate 160 MG tablet Take 160 mg by mouth daily.      [provider]  metFORMIN (GLUCOPHAGE) 500 MG tablet Take 500 mg by mouth daily with breakfast.     [provider]  omeprazole (PRILOSEC) 20 MG capsule Take 20 mg by mouth daily.      [provider]  rosuvastatin (CRESTOR) 10 MG tablet Take 10 mg by mouth daily.      [provider]  spironolactone (ALDACTONE) 25 MG tablet Take 1 tablet (25 mg total) by mouth daily. 06/23/17   Daleen Bo, MD    Family History Family History  Problem Relation Age of Onset  . Hypertension Mother   . Heart Problems Mother   . Hyperlipidemia Mother   . Coronary artery disease Father   . Diabetes Father   . Cancer Paternal  Uncle        stomach    Social History Social History   Tobacco Use  . Smoking status: Current Every Day Smoker    Packs/day: 1.00    Years: 42.00    Pack years: 42.00    Types: Cigarettes  . Smokeless tobacco: Never Used  . Tobacco comment: quit 2011  Substance Use Topics  . Alcohol use: No    Alcohol/week: 36.0 oz    Types: 60 Cans of beer per week    Comment: Quit in Jan 2018 having drank 12 pack of beer daily.  . Drug use: No    Comment: History of cocaine use- [facility] admission 2005.      Allergies   Hydrocodone bitartrate er and Sulfonamide derivatives   Review of Systems Review of Systems  All other systems reviewed and are  negative.    Physical Exam Updated Vital Signs BP 125/80   Pulse 66   Temp 98.2 F (36.8 C) (Oral)   Resp 15   Ht 5' 9.5" (1.765 m)   Wt 88.5 kg (195 lb)   SpO2 100%   BMI 28.38 kg/m   Physical Exam  Constitutional: He is oriented to person, place, and time. He appears well-developed and well-nourished. He does not appear ill.  HENT:  Head: Normocephalic and atraumatic.  Right Ear: External ear normal.  Left Ear: External ear normal.  Eyes: Conjunctivae and EOM are normal. Pupils are equal, round, and reactive to light.  Neck: Normal range of motion and phonation normal. Neck supple.  Cardiovascular: Normal rate, regular rhythm and normal heart sounds.  Pulmonary/Chest: Effort normal and breath sounds normal. He exhibits no bony tenderness.  Abdominal: Soft. He exhibits ascites (Fluid wave present). There is no tenderness. There is no rigidity and no guarding.  Musculoskeletal: Normal range of motion. He exhibits edema (Bilateral lower legs, 1-2+).  Neurological: He is alert and oriented to person, place, and time. No cranial nerve deficit or sensory deficit. He exhibits normal muscle tone. Coordination normal.  Skin: Skin is warm, dry and intact.  Psychiatric: He has a normal mood and affect. His behavior is normal. Judgment and thought content normal.  Nursing note and vitals reviewed.    ED Treatments / Results  Labs (all labs ordered are listed, but only abnormal results are displayed) Labs Reviewed  COMPREHENSIVE METABOLIC PANEL - Abnormal; Notable for the following components:      Result Value   Sodium 134 (*)    Glucose, Bld 167 (*)    Calcium 8.6 (*)    Albumin 3.4 (*)    AST 50 (*)    All other components within normal limits  CBC - Abnormal; Notable for the following components:   WBC 2.9 (*)    RBC 3.48 (*)    Hemoglobin 10.5 (*)    HCT 32.9 (*)    RDW 16.4 (*)    Platelets 46 (*)    All other components within normal limits  URINALYSIS, ROUTINE W  REFLEX MICROSCOPIC - Abnormal; Notable for the following components:   Color, Urine AMBER (*)    Glucose, UA 50 (*)    Ketones, ur 5 (*)    All other components within normal limits  LIPASE, BLOOD    EKG  EKG Interpretation None       Radiology No results found.  Procedures Procedures (including critical care time)  Medications Ordered in ED Medications - No data to display   Initial Impression / Assessment  and Plan / ED Course  I have reviewed the triage vital signs and the nursing notes.  Pertinent labs & imaging results that were available during my care of the patient were reviewed by me and considered in my medical decision making (see chart for details).  Clinical Course as of Jun 24 1807  Mon Jun 23, 2017  1744 Slightly low Sodium: (!) 134 [EW]  1744 Mildly elevated Glucose: (!) 167 [EW]  1745 Low Albumin: (!) 3.4 [EW]  1745 Mildly elevated AST: (!) 50 [EW]  1745 Low WBC: (!) 2.9 [EW]  1745 Low Hemoglobin: (!) 10.5 [EW]  1745 Low Platelets: (!) 46 [EW]  1745 Normal Specific Gravity, Urine: 1.028 [EW]  1752 Normal Potassium: 3.9 [EW]    Clinical Course User Index [EW] Daleen Bo, MD     Patient Vitals for the past 24 hrs:  BP Temp Temp src Pulse Resp SpO2 Height Weight  06/23/17 1800 125/80 - - 66 15 100 % - -  06/23/17 1516 - - - - - - 5' 9.5" (1.765 m) 88.5 kg (195 lb)  06/23/17 1515 (!) 156/81 98.2 F (36.8 C) Oral 73 20 99 % - -    5:44 PM Reevaluation with update and discussion. After initial assessment and treatment, an updated evaluation reveals no change in clinical status, findings discussed with the patient and all questions were answered. Daleen Bo      Final Clinical Impressions(s) / ED Diagnoses   Final diagnoses:  Ascites due to alcoholic cirrhosis ([place])  Pancytopenia ([place])  Hyperglycemia  Hypertension, unspecified type    ED Discharge Orders        Ordered    spironolactone (ALDACTONE) 25 MG tablet  Daily      06/23/17 1750      Evaluation consistent with worsening ascites from cirrhosis.  Patient is nontoxic.  Doubt spontaneous bacterial peritonitis, serious bacterial infection, metabolic instability or impending vascular collapse.  Mild hypertension present today.  Blood pressure Chris improve with treatment of ascites.  Nursing Notes Reviewed/ Care Coordinated Applicable Imaging Reviewed Interpretation of Laboratory Data incorporated into ED treatment  The patient appears reasonably screened and/or stabilized for discharge and I doubt any other medical condition or other [facility] requiring further screening, evaluation, or treatment in the ED at this time prior to discharge.  Plan: Home Medications-avoid taking lisinopril, and Lasix for now; Home Treatments-low-salt diet; return here if the recommended treatment, does not improve the symptoms; Recommended follow up-GI follow-up as soon as possible.  Blood check 1 or 2 weeks to reassess potassium level.    Daleen Bo, MD 06/23/17 [phone]

## 2017-06-23 NOTE — ED Triage Notes (Addendum)
PT c/o distended abdomen with increased flatulence x2 weeks. PT denies any urinary symptoms. PT also adds that he had been diagnosed with early cirrhosis .

## 2017-06-23 NOTE — Discharge Instructions (Signed)
We are prescribing a new diuretic medication, fluid pill, to help the swelling.  It is important to stay on a low-salt diet.  He will need to follow-up with a doctor regarding the ascites and gastroenterologist is a good place to go.  Also, you will need to have your blood checked in the next couple of weeks to make sure that your potassium is not rising too much.  Her blood pressure and blood sugar was somewhat elevated today.  Do not restart the lisinopril, for now since the diuretic medication may lower your blood pressure, and lisinopril can raise the potassium.  Continue to try to avoid alcohol.  Return here, if needed, for problems.

## 2017-07-08 ENCOUNTER — Encounter (HOSPITAL_COMMUNITY): Payer: Self-pay | Admitting: Emergency Medicine

## 2017-07-08 ENCOUNTER — Emergency Department (HOSPITAL_COMMUNITY): Payer: Medicare Other

## 2017-07-08 ENCOUNTER — Emergency Department (HOSPITAL_COMMUNITY)
Admission: EM | Admit: 2017-07-08 | Discharge: 2017-07-08 | Disposition: A | Discharge status: Home / Self Care | Payer: Medicare Other | Attending: Emergency Medicine | Admitting: Emergency Medicine

## 2017-07-08 ENCOUNTER — Other Ambulatory Visit: Payer: Self-pay

## 2017-07-08 DIAGNOSIS — R109 Unspecified abdominal pain: Secondary | ICD-10-CM | POA: Diagnosis present

## 2017-07-08 DIAGNOSIS — Z5321 Procedure and treatment not carried out due to patient leaving prior to being seen by health care provider: Secondary | ICD-10-CM | POA: Insufficient documentation

## 2017-07-08 LAB — COMPREHENSIVE METABOLIC PANEL
ALBUMIN: 3.3 g/dL — AB (ref 3.5–5.0)
ALK PHOS: 126 U/L (ref 38–126)
ALT: 26 U/L (ref 17–63)
AST: 53 U/L — AB (ref 15–41)
Anion gap: 8 (ref 5–15)
BILIRUBIN TOTAL: 1.7 mg/dL — AB (ref 0.3–1.2)
BUN: 12 mg/dL (ref 6–20)
CALCIUM: 8.6 mg/dL — AB (ref 8.9–10.3)
CO2: 26 mmol/L (ref 22–32)
CREATININE: 0.92 mg/dL (ref 0.61–1.24)
Chloride: 101 mmol/L (ref 101–111)
GFR calc Af Amer: >60 mL/min (ref >60)
GLUCOSE: 182 mg/dL — AB (ref 65–99)
Potassium: 3.6 mmol/L (ref 3.5–5.1)
Sodium: 135 mmol/L (ref 135–145)
TOTAL PROTEIN: 6.7 g/dL (ref 6.5–8.1)

## 2017-07-08 LAB — CBC WITH DIFFERENTIAL/PLATELET
BASOS ABS: 0 10*3/uL (ref 0.0–0.1)
BASOS PCT: 0 %
EOS ABS: 0.2 10*3/uL (ref 0.0–0.7)
EOS PCT: 4 %
HCT: 33.4 % — ABNORMAL LOW (ref 39.0–52.0)
Hemoglobin: 10.8 g/dL — ABNORMAL LOW (ref 13.0–17.0)
Lymphocytes Relative: 20 %
Lymphs Abs: 0.9 10*3/uL (ref 0.7–4.0)
MCH: 30.2 pg (ref 26.0–34.0)
MCHC: 32.3 g/dL (ref 30.0–36.0)
MCV: 93.3 fL (ref 78.0–100.0)
MONO ABS: 0.1 10*3/uL (ref 0.1–1.0)
Monocytes Relative: 1 %
Neutro Abs: 3.4 10*3/uL (ref 1.7–7.7)
Neutrophils Relative %: 75 %
PLATELETS: 66 10*3/uL — AB (ref 150–400)
RBC: 3.58 MIL/uL — AB (ref 4.22–5.81)
RDW: 16 % — AB (ref 11.5–15.5)
WBC: 4.5 10*3/uL (ref 4.0–10.5)

## 2017-07-08 LAB — CBG MONITORING, ED: GLUCOSE-CAPILLARY: 179 mg/dL — AB (ref 65–99)

## 2017-07-08 LAB — BRAIN NATRIURETIC PEPTIDE: B NATRIURETIC PEPTIDE 5: 98 pg/mL (ref 0.0–100.0)

## 2017-07-08 LAB — LIPASE, BLOOD: Lipase: 27 U/L (ref 11–51)

## 2017-07-08 NOTE — ED Notes (Signed)
Pt left. 

## 2017-07-08 NOTE — ED Triage Notes (Signed)
Pt here 2 wks ago for same. Pt c/o fluid retention, weight gain. abd dist present. Pt also c/o ble swelling also. Mild sob. Noted. A/o.

## 2017-07-08 NOTE — ED Notes (Signed)
Not in waiting area.  

## 2017-07-09 ENCOUNTER — Emergency Department (HOSPITAL_COMMUNITY): Payer: Medicare Other

## 2017-07-09 ENCOUNTER — Encounter (HOSPITAL_COMMUNITY): Payer: Self-pay

## 2017-07-09 ENCOUNTER — Emergency Department (HOSPITAL_COMMUNITY)
Admission: EM | Admit: 2017-07-09 | Discharge: 2017-07-09 | Disposition: A | Discharge status: Home / Self Care | Payer: Medicare Other | Attending: Emergency Medicine | Admitting: Emergency Medicine

## 2017-07-09 ENCOUNTER — Other Ambulatory Visit: Payer: Self-pay

## 2017-07-09 DIAGNOSIS — Z7984 Long term (current) use of oral hypoglycemic drugs: Secondary | ICD-10-CM | POA: Diagnosis not present

## 2017-07-09 DIAGNOSIS — K7031 Alcoholic cirrhosis of liver with ascites: Secondary | ICD-10-CM | POA: Insufficient documentation

## 2017-07-09 DIAGNOSIS — E785 Hyperlipidemia, unspecified: Secondary | ICD-10-CM | POA: Diagnosis not present

## 2017-07-09 DIAGNOSIS — Z96659 Presence of unspecified artificial knee joint: Secondary | ICD-10-CM | POA: Insufficient documentation

## 2017-07-09 DIAGNOSIS — F17210 Nicotine dependence, cigarettes, uncomplicated: Secondary | ICD-10-CM | POA: Diagnosis not present

## 2017-07-09 DIAGNOSIS — R140 Abdominal distension (gaseous): Secondary | ICD-10-CM | POA: Insufficient documentation

## 2017-07-09 DIAGNOSIS — G894 Chronic pain syndrome: Secondary | ICD-10-CM | POA: Diagnosis not present

## 2017-07-09 DIAGNOSIS — R2243 Localized swelling, mass and lump, lower limb, bilateral: Secondary | ICD-10-CM | POA: Insufficient documentation

## 2017-07-09 DIAGNOSIS — E119 Type 2 diabetes mellitus without complications: Secondary | ICD-10-CM | POA: Diagnosis not present

## 2017-07-09 DIAGNOSIS — I10 Essential (primary) hypertension: Secondary | ICD-10-CM | POA: Insufficient documentation

## 2017-07-09 DIAGNOSIS — R609 Edema, unspecified: Secondary | ICD-10-CM | POA: Insufficient documentation

## 2017-07-09 DIAGNOSIS — Z79899 Other long term (current) drug therapy: Secondary | ICD-10-CM | POA: Insufficient documentation

## 2017-07-09 DIAGNOSIS — R1900 Intra-abdominal and pelvic swelling, mass and lump, unspecified site: Secondary | ICD-10-CM | POA: Diagnosis present

## 2017-07-09 HISTORY — DX: Alcohol abuse, uncomplicated: F10.10

## 2017-07-09 LAB — CBC WITH DIFFERENTIAL/PLATELET
BASOS PCT: 1 %
Basophils Absolute: 0 10*3/uL (ref 0.0–0.1)
EOS ABS: 0.2 10*3/uL (ref 0.0–0.7)
EOS PCT: 5 %
HCT: 33.5 % — ABNORMAL LOW (ref 39.0–52.0)
HEMOGLOBIN: 10.8 g/dL — AB (ref 13.0–17.0)
LYMPHS ABS: 0.4 10*3/uL — AB (ref 0.7–4.0)
Lymphocytes Relative: 10 %
MCH: 30.1 pg (ref 26.0–34.0)
MCHC: 32.2 g/dL (ref 30.0–36.0)
MCV: 93.3 fL (ref 78.0–100.0)
MONO ABS: 0.6 10*3/uL (ref 0.1–1.0)
MONOS PCT: 15 %
NEUTROS PCT: 71 %
Neutro Abs: 2.8 10*3/uL (ref 1.7–7.7)
PLATELETS: 62 10*3/uL — AB (ref 150–400)
RBC: 3.59 MIL/uL — ABNORMAL LOW (ref 4.22–5.81)
RDW: 15.9 % — AB (ref 11.5–15.5)
WBC: 4 10*3/uL (ref 4.0–10.5)

## 2017-07-09 LAB — TROPONIN I

## 2017-07-09 LAB — COMPREHENSIVE METABOLIC PANEL
ALBUMIN: 3.4 g/dL — AB (ref 3.5–5.0)
ALK PHOS: 126 U/L (ref 38–126)
ALT: 26 U/L (ref 17–63)
ANION GAP: 6 (ref 5–15)
AST: 55 U/L — ABNORMAL HIGH (ref 15–41)
BILIRUBIN TOTAL: 1.6 mg/dL — AB (ref 0.3–1.2)
BUN: 8 mg/dL (ref 6–20)
CALCIUM: 8.9 mg/dL (ref 8.9–10.3)
CO2: 29 mmol/L (ref 22–32)
CREATININE: 0.83 mg/dL (ref 0.61–1.24)
Chloride: 102 mmol/L (ref 101–111)
GFR calc Af Amer: >60 mL/min (ref >60)
GFR calc non Af Amer: >60 mL/min (ref >60)
GLUCOSE: 146 mg/dL — AB (ref 65–99)
Potassium: 3.5 mmol/L (ref 3.5–5.1)
Sodium: 137 mmol/L (ref 135–145)
TOTAL PROTEIN: 7 g/dL (ref 6.5–8.1)

## 2017-07-09 LAB — ETHANOL

## 2017-07-09 LAB — URINALYSIS, ROUTINE W REFLEX MICROSCOPIC
Bilirubin Urine: NEGATIVE
GLUCOSE, UA: NEGATIVE mg/dL
Hgb urine dipstick: NEGATIVE
Ketones, ur: NEGATIVE mg/dL
LEUKOCYTES UA: NEGATIVE
Nitrite: NEGATIVE
PROTEIN: NEGATIVE mg/dL
Specific Gravity, Urine: 1.004 — ABNORMAL LOW (ref 1.005–1.030)
pH: 6 (ref 5.0–8.0)

## 2017-07-09 LAB — RAPID URINE DRUG SCREEN, HOSP PERFORMED
Amphetamines: NOT DETECTED
BARBITURATES: NOT DETECTED
Benzodiazepines: NOT DETECTED
Cocaine: NOT DETECTED
Opiates: NOT DETECTED
Tetrahydrocannabinol: NOT DETECTED

## 2017-07-09 LAB — BRAIN NATRIURETIC PEPTIDE: B Natriuretic Peptide: 68 pg/mL (ref 0.0–100.0)

## 2017-07-09 LAB — PROTIME-INR
INR: 1.48
PROTHROMBIN TIME: 17.8 s — AB (ref 11.4–15.2)

## 2017-07-09 LAB — CBG MONITORING, ED: GLUCOSE-CAPILLARY: 205 mg/dL — AB (ref 65–99)

## 2017-07-09 MED ORDER — OXYCODONE HCL 5 MG PO TABS
5.0000 mg | ORAL_TABLET | Freq: Once | ORAL | Status: AC
  Administered 2017-07-09: 5 mg via ORAL
  Filled 2017-07-09: qty 1

## 2017-07-09 NOTE — Discharge Instructions (Signed)
Call your regular medical doctor today to schedule a follow up appointment within the next 2 days. Call the GI doctor today to schedule a follow up appointment within the next week.  Return to the Emergency Department immediately sooner if worsening.

## 2017-07-09 NOTE — ED Notes (Signed)
Pt refuses CT

## 2017-07-09 NOTE — ED Triage Notes (Signed)
Pt reports was here to weeks ago for swelling in abd and both legs.  Reports was put on fluid pills but no relief.  History of ascites.  Denies sob but says has a little discomfort in lower abd.  No n/v/d.

## 2017-07-09 NOTE — ED Provider Notes (Signed)
[facility] EMERGENCY DEPARTMENT Provider Note   CSN: [id number] Arrival date & time: 07/09/17  1048     History   Chief Complaint Chief Complaint  Patient presents with  . Leg Swelling  . Ascites    HPI Chris Alvarez is a 56 y.o. male.  HPI  Pt was seen at 1225. Per pt, c/o gradual onset and worsening of persistent abd "swelling" for the past 2 weeks. Has been associated with bilat LE's edema. The symptoms have been associated with no other complaints. The patient has a significant history of similar symptoms previously, recently being evaluated for this complaint and several prior evals for same. Pt has not f/u with his PMD. Denies CP/palpitations, no SOB/cough, no abd pain, no N/V/D, no fevers.    Past Medical History:  Diagnosis Date  . Alcohol abuse   . Cirrhosis ([place])   . Depressed   . DM2 (diabetes mellitus, type 2) ([place])   . HLD (hyperlipidemia)   . HTN (hypertension)   . Other pancytopenia ([place]) 11/28/2016  . Thrombocytopenia ([place]) 08/23/2016    Patient Active Problem List   Diagnosis Date Noted  . Iron deficiency anemia 02/05/2017  . Other pancytopenia ([place]) 11/28/2016  . Hypokalemia 08/23/2016  . Leukopenia 08/23/2016  . Thrombocytopenia ([place]) 08/23/2016  . Cellulitis of leg, right 08/22/2016  . Type 2 diabetes mellitus with hemoglobin A1c goal of less than 7.0% ([place]) 08/22/2016  . Hypertension 08/22/2016  . Tobacco dependence 08/22/2016  . Cellulitis of right lower leg 08/22/2016  . HYPERLIPIDEMIA-MIXED 09/17/2010  . TOBACCO ABUSE 09/17/2010    Past Surgical History:  Procedure Laterality Date  . knee replacement (other)         Home Medications    Prior to Admission medications   Medication Sig Start Date End Date Taking? Authorizing Provider  albuterol (PROAIR HFA) 108 (90 BASE) MCG/ACT inhaler Inhale 2 puffs into the lungs every 4 (four) hours as needed for wheezing or shortness of breath. 1-2 puffs every 4-6 hours prn   Yes [provider]  fenofibrate 160 MG tablet Take 160 mg by mouth daily.     Yes [provider]  metFORMIN (GLUCOPHAGE) 500 MG tablet Take 500 mg by mouth daily with breakfast.    Yes [provider]  omeprazole (PRILOSEC) 20 MG capsule Take 20 mg by mouth daily.     Yes [provider]  oxyCODONE (OXY IR/ROXICODONE) 5 MG immediate release tablet Take 5 mg by mouth 3 (three) times daily. 06/27/17  Yes [provider]  rosuvastatin (CRESTOR) 10 MG tablet Take 10 mg by mouth daily.     Yes [provider]  spironolactone (ALDACTONE) 25 MG tablet Take 1 tablet (25 mg total) by mouth daily. Patient not taking: Reported on 07/09/2017 06/23/17   Daleen Bo, MD    Family History Family History  Problem Relation Age of Onset  . Hypertension Mother   . Heart Problems Mother   . Hyperlipidemia Mother   . Coronary artery disease Father   . Diabetes Father   . Cancer Paternal Uncle        stomach    Social History Social History   Tobacco Use  . Smoking status: Current Every Day Smoker    Packs/day: 1.00    Years: 42.00    Pack years: 42.00    Types: Cigarettes  . Smokeless tobacco: Never Used  . Tobacco comment: quit 2011  Substance Use Topics  . Alcohol use: No  Alcohol/week: 36.0 oz    Types: 60 Cans of beer per week    Comment: Quit in Jan 2018 having drank 12 pack of beer daily.  . Drug use: No    Comment: History of cocaine use- [facility] admission 2005.      Allergies   Hydrocodone bitartrate er and Sulfonamide derivatives   Review of Systems Review of Systems ROS: Statement: All systems negative except as marked or noted in the HPI; Constitutional: Negative for fever and chills. ; ; Eyes: Negative for eye pain, redness and discharge. ; ; ENMT: Negative for ear pain, hoarseness, nasal congestion, sinus pressure and sore throat. ; ; Cardiovascular: Negative for chest pain, palpitations, diaphoresis, dyspnea and +peripheral edema.  ; ; Respiratory: Negative for cough, wheezing and stridor. ; ; Gastrointestinal: +abd swelling. Negative for nausea, vomiting, diarrhea, abdominal pain, blood in stool, hematemesis, jaundice and rectal bleeding. . ; ; Genitourinary: Negative for dysuria, flank pain and hematuria. ; ; Musculoskeletal: Negative for back pain and neck pain. Negative for swelling and trauma.; ; Skin: Negative for pruritus, rash, abrasions, blisters, bruising and skin lesion.; ; Neuro: Negative for headache, lightheadedness and neck stiffness. Negative for weakness, altered level of consciousness, altered mental status, extremity weakness, paresthesias, involuntary movement, seizure and syncope.      Physical Exam Updated Vital Signs BP (!) 133/91   Pulse 75   Temp 98.3 F (36.8 C)   Resp 18   Ht 5\' 10"  (1.778 m)   Wt 89.4 kg (197 lb)   SpO2 100%   BMI 28.27 kg/m   Physical Exam 1230: Physical examination:  Nursing notes reviewed; Vital signs and O2 SAT reviewed;  Constitutional: Well developed, Well nourished, Well hydrated, In no acute distress; Head:  Normocephalic, atraumatic; Eyes: EOMI, PERRL, No scleral icterus; ENMT: Mouth and pharynx normal, Mucous membranes moist; Neck: Supple, Full range of motion, No lymphadenopathy; Cardiovascular: Regular rate and rhythm, No gallop; Respiratory: Breath sounds clear & equal bilaterally, No wheezes.  Speaking full sentences with ease, Normal respiratory effort/excursion; Chest: Nontender, Movement normal; Abdomen:  Nontender, +softly distended, Normal bowel sounds; Genitourinary: No CVA tenderness; Extremities: Pulses normal, No tenderness, +2 pedal edema bilat..; Neuro: AA&Ox3, Major CN grossly intact.  Speech clear. No gross focal motor or sensory deficits in extremities. Climbs on and off stretcher easily by himself. Gait steady..; Skin: Color normal, Warm, Dry.   ED Treatments / Results  Labs (all labs ordered are listed, but only abnormal results are  displayed)   EKG  EKG Interpretation  Date/Time:  Wednesday July 09 2017 14:23:22 EST Ventricular Rate:  69 PR Interval:    QRS Duration: 106 QT Interval:  427 QTC Calculation: 458 R Axis:   95 Text Interpretation:  Sinus rhythm Borderline right axis deviation Baseline wander No old tracing to compare Confirmed by Francine Graven [phone]) on 07/09/2017 2:28:08 PM       Radiology   Procedures Procedures (including critical care time)  Medications Ordered in ED Medications  oxyCODONE (Oxy IR/ROXICODONE) immediate release tablet 5 mg (not administered)     Initial Impression / Assessment and Plan / ED Course  I have reviewed the triage vital signs and the nursing notes.  Pertinent labs & imaging results that were available during my care of the patient were reviewed by me and considered in my medical decision making (see chart for details).  MDM Reviewed: nursing note, vitals and previous chart Reviewed previous: labs Interpretation: labs, ECG, x-ray and ultrasound   Results for  orders placed or performed during the hospital encounter of 07/09/17  Urinalysis, Routine w reflex microscopic  Result Value Ref Range   Color, Urine YELLOW YELLOW   APPearance CLEAR CLEAR   Specific Gravity, Urine 1.004 (L) 1.005 - 1.030   pH 6.0 5.0 - 8.0   Glucose, UA NEGATIVE NEGATIVE mg/dL   Hgb urine dipstick NEGATIVE NEGATIVE   Bilirubin Urine NEGATIVE NEGATIVE   Ketones, ur NEGATIVE NEGATIVE mg/dL   Protein, ur NEGATIVE NEGATIVE mg/dL   Nitrite NEGATIVE NEGATIVE   Leukocytes, UA NEGATIVE NEGATIVE  Comprehensive metabolic panel  Result Value Ref Range   Sodium 137 135 - 145 mmol/L   Potassium 3.5 3.5 - 5.1 mmol/L   Chloride 102 101 - 111 mmol/L   CO2 29 22 - 32 mmol/L   Glucose, Bld 146 (H) 65 - 99 mg/dL   BUN 8 6 - 20 mg/dL   Creatinine, Ser 0.83 0.61 - 1.24 mg/dL   Calcium 8.9 8.9 - 10.3 mg/dL   Total Protein 7.0 6.5 - 8.1 g/dL   Albumin 3.4 (L) 3.5 - 5.0 g/dL    AST 55 (H) 15 - 41 U/L   ALT 26 17 - 63 U/L   Alkaline Phosphatase 126 38 - 126 U/L   Total Bilirubin 1.6 (H) 0.3 - 1.2 mg/dL   GFR calc non Af Amer >60 >60 mL/min   GFR calc Af Amer >60 >60 mL/min   Anion gap 6 5 - 15  Brain natriuretic peptide  Result Value Ref Range   B Natriuretic Peptide 68.0 0.0 - 100.0 pg/mL  CBC with Differential  Result Value Ref Range   WBC 4.0 4.0 - 10.5 K/uL   RBC 3.59 (L) 4.22 - 5.81 MIL/uL   Hemoglobin 10.8 (L) 13.0 - 17.0 g/dL   HCT 33.5 (L) 39.0 - 52.0 %   MCV 93.3 78.0 - 100.0 fL   MCH 30.1 26.0 - 34.0 pg   MCHC 32.2 30.0 - 36.0 g/dL   RDW 15.9 (H) 11.5 - 15.5 %   Platelets 62 (L) 150 - 400 K/uL   Neutrophils Relative % 71 %   Neutro Abs 2.8 1.7 - 7.7 K/uL   Lymphocytes Relative 10 %   Lymphs Abs 0.4 (L) 0.7 - 4.0 K/uL   Monocytes Relative 15 %   Monocytes Absolute 0.6 0.1 - 1.0 K/uL   Eosinophils Relative 5 %   Eosinophils Absolute 0.2 0.0 - 0.7 K/uL   Basophils Relative 1 %   Basophils Absolute 0.0 0.0 - 0.1 K/uL  Troponin I  Result Value Ref Range   Troponin I <0.03 <0.03 ng/mL  Ethanol  Result Value Ref Range   Alcohol, Ethyl (B) <10 <10 mg/dL  Rapid urine drug screen (hospital performed)  Result Value Ref Range   Opiates NONE DETECTED NONE DETECTED   Cocaine NONE DETECTED NONE DETECTED   Benzodiazepines NONE DETECTED NONE DETECTED   Amphetamines NONE DETECTED NONE DETECTED   Tetrahydrocannabinol NONE DETECTED NONE DETECTED   Barbiturates NONE DETECTED NONE DETECTED  Protime-INR  Result Value Ref Range   Prothrombin Time 17.8 (H) 11.4 - 15.2 seconds   INR 1.48   CBG monitoring, ED  Result Value Ref Range   Glucose-Capillary 205 (H) 65 - 99 mg/dL   US Abdomen Complete Result Date: 07/09/2017 CLINICAL DATA:  Abdominal swelling which is worsening. EXAM: ABDOMEN ULTRASOUND COMPLETE COMPARISON:  Ultrasound 05/05/2017 FINDINGS: Gallbladder: Massive gallbladder wall thickening, up to 1.8 cm in thickness. This is consistent with  hydrops.  This could be infectious or noninfectious. No gallstone is seen. No foci of shadowing or markedly increased echogenicity in the wall to suggest gangrenous cholecystitis. Common bile duct: Diameter: 3 mm common normal. Liver: Markedly echogenic with lobular surface consistent with chronic cirrhosis. No evidence of focal lesion. Portal vein is patent on color Doppler imaging with normal direction of blood flow towards the liver. IVC: Not seen because of overlying gas. Pancreas: Not seen because of overlying gas. Spleen: 16.8 cm in length.  Volume enlarged at 1082 cc Right Kidney: Length: 13.0 cm. Slightly echogenic. No mass or hydronephrosis. Left Kidney: Length: 13.4 cm. Slightly echogenic. No mass or hydronephrosis. Abdominal aorta: Not seen because of overlying bowel gas. Other findings: Moderate amount of ascites, increased since the previous exam. IMPRESSION: Massive gallbladder wall thickening, up to 1.8 cm. This is seen in the setting of cirrhosis and portal venous hypertension with splenomegaly and increasing ascites. The increasing ascites probably explains the worsening abdominal swelling. This is an unusual appearance of the gallbladder and may simply relate to the ascites and hydrops. I could not exclude the possibility of infectious cholecystitis on the basis of this study. Electronically Signed   By: Nelson Chimes M.D.   On: 07/09/2017 13:31   Dg Abd Acute W/chest Result Date: 07/09/2017 CLINICAL DATA:  Abdominal swelling. History of cirrhosis. Current smoker. EXAM: DG ABDOMEN ACUTE W/ 1V CHEST COMPARISON:  PA and lateral chest x-ray of July 08, 2017 FINDINGS: The lungs are adequately inflated. The interstitial markings are coarse though stable. The heart and pulmonary vascularity are normal. The observed bony thorax is unremarkable. Within the abdomen the bowel gas pattern is within the limits of normal. No abnormal soft tissue calcifications are observed. There are mild degenerative  disc changes of the lumbar spine. IMPRESSION: No acute intra-abdominal abnormality is observed. If there are clinical concerns of increasing ascites, ultrasound would be the most useful next imaging step. Chronic bronchitic-smoking related changes in both lungs. No acute cardiopulmonary abnormality. Electronically Signed   By: David  Martinique M.D.   On: 07/09/2017 13:00    1430:  H/H per baseline. [place] as above. CT scan ordered. Pt has gotten himself dressed and states he "just wants a dose of my pain medicine and then leave."  Pt informed re: dx testing results and that I recommend further ED testing.  Pt refuses to stay, repeating the above.  ED RN and I encouraged pt to stay, continues to refuse.  Pt makes his own medical decisions.  Risks of AMA explained to pt, including, but not limited to:  Sepsis, stroke, heart attack, cardiac arrythmia ("irregular heart rate/beat"), "passing out," temporary and/or permanent disability, death.  Pt verb understanding and continues to refuse further testing, understanding the consequences of his decision.  I encouraged pt to follow up with his PMD tomorrow and return to the ED immediately if symptoms worsen, or for any other concerns.  Pt verb understanding, agreeable.   Final Clinical Impressions(s) / ED Diagnoses   Final diagnoses:  Abdominal distention  Ascites due to alcoholic cirrhosis ([place])  Peripheral edema  Chronic pain syndrome    ED Discharge Orders    None       Francine Graven, DO 07/11/17 1619

## 2017-07-16 ENCOUNTER — Ambulatory Visit (INDEPENDENT_AMBULATORY_CARE_PROVIDER_SITE_OTHER): Payer: Medicare Other | Admitting: Internal Medicine

## 2017-08-06 ENCOUNTER — Telehealth: Payer: Self-pay | Admitting: Gastroenterology

## 2017-08-06 NOTE — Telephone Encounter (Signed)
Spoke with Pablo Lawrence regarding patient. He is a remote [place] GI patient but needs to stay local for care. She is requesting a referral for cirrhosis. Please make appointment once papers are received.

## 2017-08-11 ENCOUNTER — Encounter: Payer: Self-pay | Admitting: Nurse Practitioner

## 2017-08-11 ENCOUNTER — Other Ambulatory Visit: Payer: Self-pay | Admitting: *Deleted

## 2017-08-11 ENCOUNTER — Ambulatory Visit (INDEPENDENT_AMBULATORY_CARE_PROVIDER_SITE_OTHER): Payer: Medicare Other | Admitting: Nurse Practitioner

## 2017-08-11 ENCOUNTER — Encounter: Payer: Self-pay | Admitting: *Deleted

## 2017-08-11 ENCOUNTER — Telehealth: Payer: Self-pay | Admitting: *Deleted

## 2017-08-11 DIAGNOSIS — K219 Gastro-esophageal reflux disease without esophagitis: Secondary | ICD-10-CM | POA: Diagnosis not present

## 2017-08-11 DIAGNOSIS — Z87898 Personal history of other specified conditions: Secondary | ICD-10-CM

## 2017-08-11 DIAGNOSIS — K7031 Alcoholic cirrhosis of liver with ascites: Secondary | ICD-10-CM

## 2017-08-11 DIAGNOSIS — K766 Portal hypertension: Secondary | ICD-10-CM | POA: Diagnosis not present

## 2017-08-11 DIAGNOSIS — K7460 Unspecified cirrhosis of liver: Secondary | ICD-10-CM | POA: Insufficient documentation

## 2017-08-11 DIAGNOSIS — F1011 Alcohol abuse, in remission: Secondary | ICD-10-CM

## 2017-08-11 MED ORDER — SPIRONOLACTONE 50 MG PO TABS: 50.0000 mg | ORAL_TABLET | Freq: Every day | ORAL | 3 refills | Status: DC

## 2017-08-11 MED ORDER — FUROSEMIDE 20 MG PO TABS: 20.0000 mg | ORAL_TABLET | Freq: Every day | ORAL | 3 refills | Status: DC
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
## 2017-08-11 NOTE — Telephone Encounter (Signed)
Spoke with pt and is aware preop scheduled for 09/02/17 at 11:00am. Letter mailed.

## 2017-08-11 NOTE — Progress Notes (Signed)
CC'D TO PCP °

## 2017-08-11 NOTE — Assessment & Plan Note (Signed)
GERD symptoms are currently well managed on PPI.  Recommend he continue this.  Return for follow-up in 3 months.

## 2017-08-11 NOTE — Assessment & Plan Note (Addendum)
The patient has significant exacerbation and decompensation of liver cirrhosis.  This is likely majority due to alcoholism.  I am checking other viral serologies for hepatitis B and C.  He will likely need hepatitis A and/or be vaccination.  He has 3+ pitting edema in bilateral lower extremities.  Significant rounded abdomen with likely at least moderate cirrhosis.  Fluid wave is present.  Denies neurological symptoms, no tremors noted on exam.  He most definitely has portal hypertension.  His meld score in the emergency department is 13.  I have ordered additional labs including CBC, CMP, INR, AFP to further evaluate.  Further management of his ascites and edema as per below.  I have given him educational information related to a 2 g sodium diet.  We can refer him to a nutritionist if he requests.  We will schedule him for first ever ultrasound paracentesis.  Requested maximum withdrawal of 5 L given his first-ever exam.  Plan for 25 g of IV albumin after the first 4 L are removed.  Follow-up in 3 months.

## 2017-08-11 NOTE — Patient Instructions (Addendum)
1. Have your labs drawn when you can. 2. We will help schedule your upper endoscopy to evaluate for swollen blood vessels. 3. We will help you schedule the ultrasound paracentesis to remove fluid.  Because you have never had one before, there is a limit to how much we can remove this time. 4. I have sent in prescriptions to your pharmacy for diuretics.  The first is Spironolactone (Aldactone) 50 mg once a day.  The second 1 is Lasix (furosemide) 20 mg once a day. 5. We recommend a diet that is less than 2 g of sodium per day in total.  Further information is below.  Let us know if you would like a consult with a nutritionist and we can arrange this. 6. It is imperative, and essentially necessary to help save your life, for you to quit drinking all alcohol. 7. Return for follow-up in 3 months. 8. Call if you have any questions or concerns.     Cirrhosis Cirrhosis is long-term (chronic) liver injury. The liver is your largest internal organ, and it performs many functions. The liver converts food into energy, removes toxic material from your blood, makes important proteins, and absorbs necessary vitamins from your diet. If you have cirrhosis, it means many of your healthy liver cells have been replaced by scar tissue. This prevents blood from flowing through your liver, which makes it difficult for your liver to function. This scarring is not reversible, but treatment can prevent it from getting worse. What are the causes? Hepatitis C and long-term alcohol abuse are the most common causes of cirrhosis. Other causes include:  Nonalcoholic fatty liver disease.  Hepatitis B infection.  Autoimmune hepatitis.  Diseases that cause blockage of ducts inside the liver.  Inherited liver diseases.  Reactions to certain long-term medicines.  Parasitic infections.  Long-term exposure to certain toxins.  What increases the risk? You may have a higher risk of cirrhosis if you:  Have certain  hepatitis viruses.  Abuse alcohol, especially if you are male.  Are overweight.  Share needles.  Have unprotected sex with someone who has hepatitis.  What are the signs or symptoms? You may not have any signs and symptoms at first. Symptoms may not develop until the damage to your liver starts to get worse. Signs and symptoms of cirrhosis may include:  Tenderness in the right-upper part of your abdomen.  Weakness and tiredness (fatigue).  Loss of appetite.  Nausea.  Weight loss and muscle loss.  Itchiness.  Yellow skin and eyes (jaundice).  Buildup of fluid in the abdomen (ascites).  Swelling of the feet and ankles (edema).  Appearance of tiny blood vessels under the skin.  Mental confusion.  Easy bruising and bleeding.  How is this diagnosed? Your health care provider may suspect cirrhosis based on your symptoms and medical history, especially if you have other medical conditions or a history of alcohol abuse. Your health care provider will do a physical exam to feel your liver and check for signs of cirrhosis. Your health care provider may perform other tests, including:  Blood tests to check: ? Whether you have hepatitis B or C. ? Kidney function. ? Liver function.  Imaging tests such as: ? MRI or CT scan to look for changes seen in advanced cirrhosis. ? Ultrasound to see if normal liver tissue is being replaced by scar tissue.  A procedure using a long needle to take a sample of liver tissue (biopsy) for examination under a microscope. Liver biopsy can  confirm the diagnosis of cirrhosis.  How is this treated? Treatment depends on how damaged your liver is and what caused the damage. Treatment may include treating cirrhosis symptoms or treating the underlying causes of the condition to try to slow the progression of the damage. Treatment may include:  Making lifestyle changes, such as: ? Eating a healthy diet. ? Restricting salt intake. ? Maintaining a  healthy weight. ? Not abusing drugs or alcohol.  Taking medicines to: ? Treat liver infections or other infections. ? Control itching. ? Reduce fluid buildup. ? Reduce certain blood toxins. ? Reduce risk of bleeding from enlarged blood vessels in the stomach or esophagus (varices).  If varices are causing bleeding problems, you may need treatment with a procedure that ties up the vessels causing them to fall off (band ligation).  If cirrhosis is causing your liver to fail, your health care provider may recommend a liver transplant.  Other treatments may be recommended depending on any complications of cirrhosis, such as liver-related kidney failure (hepatorenal syndrome).  Follow these instructions at home:  Take medicines only as directed by your health care provider. Do not use drugs that are toxic to your liver. Ask your health care provider before taking any new medicines, including over-the-counter medicines.  Rest as needed.  Eat a well-balanced diet. Ask your health care provider or dietitian for more information.  You may have to follow a low-salt diet or restrict your water intake as directed.  Do not drink alcohol. This is especially important if you are taking acetaminophen.  Keep all follow-up visits as directed by your health care provider. This is important. Contact a health care provider if:  You have fatigue or weakness that is getting worse.  You develop swelling of the hands, feet, legs, or face.  You have a fever.  You develop loss of appetite.  You have nausea or vomiting.  You develop jaundice.  You develop easy bruising or bleeding. Get help right away if:  You vomit bright red blood or a material that looks like coffee grounds.  You have blood in your stools.  Your stools appear black and tarry.  You become confused.  You have chest pain or trouble breathing. This information is not intended to replace advice given to you by your health  care provider. Make sure you discuss any questions you have with your health care provider. Document Released: 07/15/2005 Document Revised: 11/23/2015 Document Reviewed: 03/23/2014 Elsevier Interactive Patient Education  2018 [employer].    Low-Sodium Eating Plan Sodium, which is an element that makes up salt, helps you maintain a healthy balance of fluids in your body. Too much sodium can increase your blood pressure and cause fluid and waste to be held in your body. Your health care provider or dietitian may recommend following this plan if you have high blood pressure (hypertension), kidney disease, liver disease, or heart failure. Eating less sodium can help lower your blood pressure, reduce swelling, and protect your heart, liver, and kidneys. What are tips for following this plan? General guidelines  Most people on this plan should limit their sodium intake to 1,500-2,000 mg (milligrams) of sodium each day. Reading food labels  The Nutrition Facts label lists the amount of sodium in one serving of the food. If you eat more than one serving, you must multiply the listed amount of sodium by the number of servings.  Choose foods with less than 140 mg of sodium per serving.  Avoid foods with 300  mg of sodium or more per serving. Shopping  Look for lower-sodium products, often labeled as "low-sodium" or "no salt added."  Always check the sodium content even if foods are labeled as "unsalted" or "no salt added".  Buy fresh foods. ? Avoid canned foods and premade or frozen meals. ? Avoid canned, cured, or processed meats  Buy breads that have less than 80 mg of sodium per slice. Cooking  Eat more home-cooked food and less restaurant, buffet, and fast food.  Avoid adding salt when cooking. Use salt-free seasonings or herbs instead of table salt or sea salt. Check with your health care provider or pharmacist before using salt substitutes.  Cook with plant-based oils, such as  canola, sunflower, or olive oil. Meal planning  When eating at a restaurant, ask that your food be prepared with less salt or no salt, if possible.  Avoid foods that contain MSG (monosodium glutamate). MSG is sometimes added to [place] food, bouillon, and some canned foods. What foods are recommended? The items listed may not be a complete list. Talk with your dietitian about what dietary choices are best for you. Grains Low-sodium cereals, including oats, puffed wheat and rice, and shredded wheat. Low-sodium crackers. Unsalted rice. Unsalted pasta. Low-sodium bread. Whole-grain breads and whole-grain pasta. Vegetables Fresh or frozen vegetables. "No salt added" canned vegetables. "No salt added" tomato sauce and paste. Low-sodium or reduced-sodium tomato and vegetable juice. Fruits Fresh, frozen, or canned fruit. Fruit juice. Meats and other protein foods Fresh or frozen (no salt added) meat, poultry, seafood, and fish. Low-sodium canned tuna and salmon. Unsalted nuts. Dried peas, beans, and lentils without added salt. Unsalted canned beans. Eggs. Unsalted nut butters. Dairy Milk. Soy milk. Cheese that is naturally low in sodium, such as ricotta cheese, fresh mozzarella, or Swiss cheese Low-sodium or reduced-sodium cheese. Cream cheese. Yogurt. Fats and oils Unsalted butter. Unsalted margarine with no trans fat. Vegetable oils such as canola or olive oils. Seasonings and other foods Fresh and dried herbs and spices. Salt-free seasonings. Low-sodium mustard and ketchup. Sodium-free salad dressing. Sodium-free light mayonnaise. Fresh or refrigerated horseradish. Lemon juice. Vinegar. Homemade, reduced-sodium, or low-sodium soups. Unsalted popcorn and pretzels. Low-salt or salt-free chips. What foods are not recommended? The items listed may not be a complete list. Talk with your dietitian about what dietary choices are best for you. Grains Instant hot cereals. Bread stuffing, pancake, and  biscuit mixes. Croutons. Seasoned rice or pasta mixes. Noodle soup cups. Boxed or frozen macaroni and cheese. Regular salted crackers. Self-rising flour. Vegetables Sauerkraut, pickled vegetables, and relishes. Olives. [place] fries. Onion rings. Regular canned vegetables (not low-sodium or reduced-sodium). Regular canned tomato sauce and paste (not low-sodium or reduced-sodium). Regular tomato and vegetable juice (not low-sodium or reduced-sodium). Frozen vegetables in sauces. Meats and other protein foods Meat or fish that is salted, canned, smoked, spiced, or pickled. Bacon, ham, sausage, hotdogs, corned beef, chipped beef, packaged lunch meats, salt pork, jerky, pickled herring, anchovies, regular canned tuna, sardines, salted nuts. Dairy Processed cheese and cheese spreads. Cheese curds. Blue cheese. Feta cheese. String cheese. Regular cottage cheese. Buttermilk. Canned milk. Fats and oils Salted butter. Regular margarine. Ghee. Bacon fat. Seasonings and other foods Onion salt, garlic salt, seasoned salt, table salt, and sea salt. Canned and packaged gravies. Worcestershire sauce. Tartar sauce. Barbecue sauce. Teriyaki sauce. Soy sauce, including reduced-sodium. Steak sauce. Fish sauce. Oyster sauce. Cocktail sauce. Horseradish that you find on the shelf. Regular ketchup and mustard. Meat flavorings and tenderizers. Bouillon cubes.  Hot sauce and Tabasco sauce. Premade or packaged marinades. Premade or packaged taco seasonings. Relishes. Regular salad dressings. Salsa. Potato and tortilla chips. Corn chips and puffs. Salted popcorn and pretzels. Canned or dried soups. Pizza. Frozen entrees and pot pies. Summary  Eating less sodium can help lower your blood pressure, reduce swelling, and protect your heart, liver, and kidneys.  Most people on this plan should limit their sodium intake to 1,500-2,000 mg (milligrams) of sodium each day.  Canned, boxed, and frozen foods are high in sodium. Restaurant  foods, fast foods, and pizza are also very high in sodium. You also get sodium by adding salt to food.  Try to cook at home, eat more fresh fruits and vegetables, and eat less fast food, canned, processed, or prepared foods. This information is not intended to replace advice given to you by your health care provider. Make sure you discuss any questions you have with your health care provider. Document Released: 01/04/2002 Document Revised: 07/08/2016 Document Reviewed: 07/08/2016 Elsevier Interactive Patient Education  [employer].

## 2017-08-11 NOTE — Assessment & Plan Note (Signed)
History of alcohol abuse.  He admits he used to drink a 12 pack of beer a day.  He is cut way back and is currently drinking an average of a 6 pack of beer, typically over the course of the weekend only.  I discussed the likely etiology of his cirrhosis at least majorly contributed to by alcoholism.  He states he can do with or without alcohol.  I recommended he abstain from all further alcohol consumption.  He is agreed to do so.  We are available to help support him in this endeavor.  We can consider referral to a treatment center if needed and/or requested.

## 2017-08-11 NOTE — Assessment & Plan Note (Signed)
The patient almost definitely has portal hypertension.  He has significant ascites, lower extremity edema, pancytopenia, splenomegaly.  I will start him on diuretics at the half goal dose in order to transition him into full dose without over loading his system.  Start with Aldactone 50 mg daily and Lasix 20 mg daily.  We can increase this at his next visit if necessary.  He is never been screened for varices.  We will set him up for EGD on propofol/MAC to screen for his varices.  His last platelet count was 62 which is generally acceptable, although increased risk, for EGD with possible banding ligation.  Proceed with EGD +/- variceal band ligation with Dr. Gala Romney in near future: the risks, benefits, and alternatives have been discussed with the patient in detail. The patient states understanding and desires to proceed.  The patient is currently on OxyContin.  His platelet count is 62.  No other anticoagulants, anxiolytics, chronic pain medications, or antidepressants.  He has a history of alcohol abuse.  We will plan for the procedure on propofol/MAC to promote adequate sedation.

## 2017-08-14 ENCOUNTER — Ambulatory Visit (HOSPITAL_COMMUNITY)
Admission: RE | Admit: 2017-08-14 | Discharge: 2017-08-14 | Disposition: A | Discharge status: Home / Self Care | Payer: Medicare Other | Source: Ambulatory Visit | Attending: Nurse Practitioner | Admitting: Nurse Practitioner

## 2017-08-14 DIAGNOSIS — K7031 Alcoholic cirrhosis of liver with ascites: Secondary | ICD-10-CM

## 2017-08-14 DIAGNOSIS — K219 Gastro-esophageal reflux disease without esophagitis: Secondary | ICD-10-CM

## 2017-08-14 DIAGNOSIS — F1011 Alcohol abuse, in remission: Secondary | ICD-10-CM

## 2017-08-18 ENCOUNTER — Encounter (HOSPITAL_COMMUNITY): Payer: Self-pay

## 2017-08-18 ENCOUNTER — Ambulatory Visit (HOSPITAL_COMMUNITY)
Admission: RE | Admit: 2017-08-18 | Discharge: 2017-08-18 | Disposition: A | Discharge status: Home / Self Care | Payer: Medicare Other | Source: Ambulatory Visit | Attending: Nurse Practitioner | Admitting: Nurse Practitioner

## 2017-08-18 DIAGNOSIS — K7031 Alcoholic cirrhosis of liver with ascites: Secondary | ICD-10-CM | POA: Insufficient documentation

## 2017-08-18 DIAGNOSIS — K219 Gastro-esophageal reflux disease without esophagitis: Secondary | ICD-10-CM | POA: Insufficient documentation

## 2017-08-18 DIAGNOSIS — F1011 Alcohol abuse, in remission: Secondary | ICD-10-CM | POA: Insufficient documentation

## 2017-08-18 LAB — GRAM STAIN

## 2017-08-18 LAB — BODY FLUID CELL COUNT WITH DIFFERENTIAL
LYMPHS FL: 15 %
MONOCYTE-MACROPHAGE-SEROUS FLUID: 77 % (ref 50–90)
NEUTROPHIL FLUID: 8 % (ref 0–25)
Total Nucleated Cell Count, Fluid: 243 cu mm (ref 0–1000)

## 2017-08-18 LAB — ALBUMIN, PLEURAL OR PERITONEAL FLUID

## 2017-08-18 LAB — PROTEIN, PLEURAL OR PERITONEAL FLUID

## 2017-08-18 NOTE — Procedures (Signed)
PreOperative Dx: Alcoholic cirrhosis, ascites Postoperative Dx: Alcoholic cirrhosis, ascites Procedure:   US guided paracentesis Radiologist:  Kimiyo Carmicheal Anesthesia:  10 ml of1% lidocaine Specimen:  4 L of yellow ascitic fluid EBL:   < 1 ml Complications: None  

## 2017-08-18 NOTE — Progress Notes (Signed)
Paracentesis complete no signs of distress, 4L ascites removed.  

## 2017-08-19 ENCOUNTER — Telehealth: Payer: Self-pay | Admitting: Internal Medicine

## 2017-08-19 NOTE — Telephone Encounter (Signed)
Pt called asking if he could schedule to have fluid drawn off on Friday morning. Please call him at [phone]

## 2017-08-19 NOTE — Telephone Encounter (Signed)
Patient had [place] PARA done yesterday. Please advise Randall Hiss thanks

## 2017-08-20 NOTE — Telephone Encounter (Signed)
Patient called back. He reports they only took 4 liters off of him and told him he had another 4 liters he needs to have taken off. Please advise thanks

## 2017-08-21 ENCOUNTER — Other Ambulatory Visit: Payer: Self-pay | Admitting: *Deleted

## 2017-08-21 DIAGNOSIS — R188 Other ascites: Secondary | ICD-10-CM

## 2017-08-21 NOTE — Telephone Encounter (Signed)
PARA scheduled for 08/22/17 at 10:00am, arrival time 9:45am. Called made pt aware of appt details. He needed nothing further.

## 2017-08-21 NOTE — Telephone Encounter (Signed)
Let's schedule him for another para when they can get him in (tomorrow? Monday?). Give 25 g IV Albumin at the start and then an additional 12.5g if they remove 6L, and an additional 12.5g for each 2L over 6L.  Let me know if any questions.

## 2017-08-22 ENCOUNTER — Encounter (HOSPITAL_COMMUNITY): Payer: Self-pay

## 2017-08-22 ENCOUNTER — Ambulatory Visit (HOSPITAL_COMMUNITY)
Admission: RE | Admit: 2017-08-22 | Discharge: 2017-08-22 | Disposition: A | Discharge status: Home / Self Care | Payer: Medicare Other | Source: Ambulatory Visit | Attending: Nurse Practitioner | Admitting: Nurse Practitioner

## 2017-08-22 DIAGNOSIS — R188 Other ascites: Secondary | ICD-10-CM | POA: Insufficient documentation

## 2017-08-22 DIAGNOSIS — K7460 Unspecified cirrhosis of liver: Secondary | ICD-10-CM | POA: Diagnosis not present

## 2017-08-22 MED ORDER — ALBUMIN HUMAN 25 % IV SOLN
INTRAVENOUS | Status: AC
  Administered 2017-08-22: 25 g via INTRAVENOUS
  Filled 2017-08-22: qty 100

## 2017-08-22 MED ORDER — ALBUMIN HUMAN 25 % IV SOLN
25.0000 g | Freq: Once | INTRAVENOUS | Status: AC
  Administered 2017-08-22: 25 g via INTRAVENOUS

## 2017-08-22 NOTE — Progress Notes (Signed)
Paracentesis complete no signs of distress, 4.6l yellow colored ascites removed.

## 2017-08-22 NOTE — Procedures (Signed)
PreOperative Dx: Cirrhosis, ascites Postoperative Dx: Cirrhosis, ascites Procedure:   US guided paracentesis Radiologist:  Avyukt Cimo Anesthesia:  10 ml of1% lidocaine Specimen:  4.6 L of yellow ascitic fluid EBL:   < 1 ml Complications: None   

## 2017-08-23 LAB — CULTURE, BODY FLUID-BOTTLE: CULTURE: NO GROWTH

## 2017-08-23 LAB — CULTURE, BODY FLUID W GRAM STAIN -BOTTLE

## 2017-08-29 NOTE — Patient Instructions (Signed)
Chris Alvarez  08/29/2017     @PREFPERIOPPHARMACY @   Your procedure is scheduled on  09/08/2017 .  Report to [facility] at  945  A.M.  Call this number if you have problems the morning of surgery:  [phone]   Remember:  Do not eat food or drink liquids after midnight.  Take these medicines the morning of surgery with A SIP OF WATER  Prilosec, oxy IR.   Do not wear jewelry, make-up or nail polish.  Do not wear lotions, powders, or perfumes, or deodorant.  Do not shave 48 hours prior to surgery.  Men may shave face and neck.  Do not bring valuables to the hospital.  [facility] is not responsible for any belongings or valuables.  Contacts, dentures or bridgework may not be worn into surgery.  Leave your suitcase in the car.  After surgery it may be brought to your room.  For patients admitted to the hospital, discharge time will be determined by your treatment team.  Patients discharged the day of surgery will not be allowed to drive home.   Name and phone number of your driver:   family Special instructions:  Follow the diet and prep instructions given to you by Dr Roseanne Kaufman office.  Please read over the following fact sheets that you were given. Anesthesia Post-op Instructions and Care and Recovery After Surgery       Esophagogastroduodenoscopy Esophagogastroduodenoscopy (EGD) is a procedure to examine the lining of the esophagus, stomach, and first part of the small intestine (duodenum). This procedure is done to check for problems such as inflammation, bleeding, ulcers, or growths. During this procedure, a long, flexible, lighted tube with a camera attached (endoscope) is inserted down the throat. Tell a health care provider about:  Any allergies you have.  All medicines you are taking, including vitamins, herbs, eye drops, creams, and over-the-counter medicines.  Any problems you or family members have had with anesthetic medicines.  Any blood  disorders you have.  Any surgeries you have had.  Any medical conditions you have.  Whether you are pregnant or may be pregnant. What are the risks? Generally, this is a safe procedure. However, problems may occur, including:  Infection.  Bleeding.  A tear (perforation) in the esophagus, stomach, or duodenum.  Trouble breathing.  Excessive sweating.  Spasms of the larynx.  A slowed heartbeat.  Low blood pressure.  What happens before the procedure?  Follow instructions from your health care provider about eating or drinking restrictions.  Ask your health care provider about: ? Changing or stopping your regular medicines. This is especially important if you are taking diabetes medicines or blood thinners. ? Taking medicines such as aspirin and ibuprofen. These medicines can thin your blood. Do not take these medicines before your procedure if your health care provider instructs you not to.  Plan to have someone take you home after the procedure.  If you wear dentures, be ready to remove them before the procedure. What happens during the procedure?  To reduce your risk of infection, your health care team will wash or sanitize their hands.  An IV tube will be put in a vein in your hand or arm. You will get medicines and fluids through this tube.  You will be given one or more of the following: ? A medicine to help you relax (sedative). ? A medicine to numb the area (local anesthetic). This medicine may be  sprayed into your throat. It will make you feel more comfortable and keep you from gagging or coughing during the procedure. ? A medicine for pain.  A mouth guard may be placed in your mouth to protect your teeth and to keep you from biting on the endoscope.  You will be asked to lie on your left side.  The endoscope will be lowered down your throat into your esophagus, stomach, and duodenum.  Air will be put into the endoscope. This will help your health care  provider see better.  The lining of your esophagus, stomach, and duodenum will be examined.  Your health care provider may: ? Take a tissue sample so it can be looked at in a lab (biopsy). ? Remove growths. ? Remove objects (foreign bodies) that are stuck. ? Treat any bleeding with medicines or other devices that stop tissue from bleeding. ? Widen (dilate) or stretch narrowed areas of your esophagus and stomach.  The endoscope will be taken out. The procedure may vary among health care providers and hospitals. What happens after the procedure?  Your blood pressure, heart rate, breathing rate, and blood oxygen level will be monitored often until the medicines you were given have worn off.  Do not eat or drink anything until the numbing medicine has worn off and your gag reflex has returned. This information is not intended to replace advice given to you by your health care provider. Make sure you discuss any questions you have with your health care provider. Document Released: 11/15/2004 Document Revised: 12/21/2015 Document Reviewed: 06/08/2015 Elsevier Interactive Patient Education  2018 [employer]. Esophagogastroduodenoscopy, Care After Refer to this sheet in the next few weeks. These instructions provide you with information about caring for yourself after your procedure. Your health care provider may also give you more specific instructions. Your treatment has been planned according to current medical practices, but problems sometimes occur. Call your health care provider if you have any problems or questions after your procedure. What can I expect after the procedure? After the procedure, it is common to have:  A sore throat.  Nausea.  Bloating.  Dizziness.  Fatigue.  Follow these instructions at home:  Do not eat or drink anything until the numbing medicine (local anesthetic) has worn off and your gag reflex has returned. You will know that the local anesthetic has worn  off when you can swallow comfortably.  Do not drive for 24 hours if you received a medicine to help you relax (sedative).  If your health care provider took a tissue sample for testing during the procedure, make sure to get your test results. This is your responsibility. Ask your health care provider or the department performing the test when your results will be ready.  Keep all follow-up visits as told by your health care provider. This is important. Contact a health care provider if:  You cannot stop coughing.  You are not urinating.  You are urinating less than usual. Get help right away if:  You have trouble swallowing.  You cannot eat or drink.  You have throat or chest pain that gets worse.  You are dizzy or light-headed.  You faint.  You have nausea or vomiting.  You have chills.  You have a fever.  You have severe abdominal pain.  You have black, tarry, or bloody stools. This information is not intended to replace advice given to you by your health care provider. Make sure you discuss any questions you have with your  health care provider. Document Released: 07/01/2012 Document Revised: 12/21/2015 Document Reviewed: 06/08/2015 Elsevier Interactive Patient Education  2018 [place] Anesthesia is a term that refers to techniques, procedures, and medicines that help a person stay safe and comfortable during a medical procedure. Monitored anesthesia care, or sedation, is one type of anesthesia. Your anesthesia specialist may recommend sedation if you will be having a procedure that does not require you to be unconscious, such as:  Cataract surgery.  A dental procedure.  A biopsy.  A colonoscopy.  During the procedure, you may receive a medicine to help you relax (sedative). There are three levels of sedation:  Mild sedation. At this level, you may feel awake and relaxed. You will be able to follow directions.  Moderate  sedation. At this level, you will be sleepy. You may not remember the procedure.  Deep sedation. At this level, you will be asleep. You will not remember the procedure.  The more medicine you are given, the deeper your level of sedation will be. Depending on how you respond to the procedure, the anesthesia specialist may change your level of sedation or the type of anesthesia to fit your needs. An anesthesia specialist will monitor you closely during the procedure. Let your health care provider know about:  Any allergies you have.  All medicines you are taking, including vitamins, herbs, eye drops, creams, and over-the-counter medicines.  Any use of steroids (by mouth or as a cream).  Any problems you or family members have had with sedatives and anesthetic medicines.  Any blood disorders you have.  Any surgeries you have had.  Any medical conditions you have, such as sleep apnea.  Whether you are pregnant or may be pregnant.  Any use of cigarettes, alcohol, or street drugs. What are the risks? Generally, this is a safe procedure. However, problems may occur, including:  Getting too much medicine (oversedation).  Nausea.  Allergic reaction to medicines.  Trouble breathing. If this happens, a breathing tube may be used to help with breathing. It will be removed when you are awake and breathing on your own.  Heart trouble.  Lung trouble.  Before the procedure Staying hydrated Follow instructions from your health care provider about hydration, which may include:  Up to 2 hours before the procedure - you may continue to drink clear liquids, such as water, clear fruit juice, black coffee, and plain tea.  Eating and drinking restrictions Follow instructions from your health care provider about eating and drinking, which may include:  8 hours before the procedure - stop eating heavy meals or foods such as meat, fried foods, or fatty foods.  6 hours before the procedure -  stop eating light meals or foods, such as toast or cereal.  6 hours before the procedure - stop drinking milk or drinks that contain milk.  2 hours before the procedure - stop drinking clear liquids.  Medicines Ask your health care provider about:  Changing or stopping your regular medicines. This is especially important if you are taking diabetes medicines or blood thinners.  Taking medicines such as aspirin and ibuprofen. These medicines can thin your blood. Do not take these medicines before your procedure if your health care provider instructs you not to.  Tests and exams  You will have a physical exam.  You may have blood tests done to show: ? How well your kidneys and liver are working. ? How well your blood can clot.  General instructions  Plan to have someone take you home from the hospital or clinic.  If you will be going home right after the procedure, plan to have someone with you for 24 hours.  What happens during the procedure?  Your blood pressure, heart rate, breathing, level of pain and overall condition will be monitored.  An IV tube will be inserted into one of your veins.  Your anesthesia specialist will give you medicines as needed to keep you comfortable during the procedure. This may mean changing the level of sedation.  The procedure will be performed. After the procedure  Your blood pressure, heart rate, breathing rate, and blood oxygen level will be monitored until the medicines you were given have worn off.  Do not drive for 24 hours if you received a sedative.  You may: ? Feel sleepy, clumsy, or nauseous. ? Feel forgetful about what happened after the procedure. ? Have a sore throat if you had a breathing tube during the procedure. ? Vomit. This information is not intended to replace advice given to you by your health care provider. Make sure you discuss any questions you have with your health care provider. Document Released: 04/10/2005  Document Revised: 12/22/2015 Document Reviewed: 11/05/2015 Elsevier Interactive Patient Education  2018 [place], Care After These instructions provide you with information about caring for yourself after your procedure. Your health care provider may also give you more specific instructions. Your treatment has been planned according to current medical practices, but problems sometimes occur. Call your health care provider if you have any problems or questions after your procedure. What can I expect after the procedure? After your procedure, it is common to:  Feel sleepy for several hours.  Feel clumsy and have poor balance for several hours.  Feel forgetful about what happened after the procedure.  Have poor judgment for several hours.  Feel nauseous or vomit.  Have a sore throat if you had a breathing tube during the procedure.  Follow these instructions at home: For at least 24 hours after the procedure:   Do not: ? Participate in activities in which you could fall or become injured. ? Drive. ? Use heavy machinery. ? Drink alcohol. ? Take sleeping pills or medicines that cause drowsiness. ? Make important decisions or sign legal documents. ? Take care of children on your own.  Rest. Eating and drinking  Follow the diet that is recommended by your health care provider.  If you vomit, drink water, juice, or soup when you can drink without vomiting.  Make sure you have little or no nausea before eating solid foods. General instructions  Have a responsible adult stay with you until you are awake and alert.  Take over-the-counter and prescription medicines only as told by your health care provider.  If you smoke, do not smoke without supervision.  Keep all follow-up visits as told by your health care provider. This is important. Contact a health care provider if:  You keep feeling nauseous or you keep vomiting.  You feel  light-headed.  You develop a rash.  You have a fever. Get help right away if:  You have trouble breathing. This information is not intended to replace advice given to you by your health care provider. Make sure you discuss any questions you have with your health care provider. Document Released: 11/05/2015 Document Revised: 03/06/2016 Document Reviewed: 11/05/2015 Elsevier Interactive Patient Education  [employer].

## 2017-09-01 ENCOUNTER — Encounter: Payer: Self-pay | Admitting: *Deleted

## 2017-09-01 ENCOUNTER — Telehealth: Payer: Self-pay | Admitting: Internal Medicine

## 2017-09-01 NOTE — Telephone Encounter (Signed)
Called spoke with pt and he is rescheduled to 09/29/17 at 12:45pm. New instructions mailed to him. Sherron Monday and LMOVM to put him on new date.

## 2017-09-01 NOTE — Telephone Encounter (Signed)
Pt said he had a family situation come up and he needs to reschedule procedure and pre op. Please call him at [phone]

## 2017-09-01 NOTE — Telephone Encounter (Signed)
Spoke with pt and is aware pre-op scheduled for 09/23/17 at 12:45pm. Nothing further needed

## 2017-09-02 ENCOUNTER — Encounter (HOSPITAL_COMMUNITY)
Admission: RE | Admit: 2017-09-02 | Discharge: 2017-09-02 | Disposition: A | Discharge status: Home / Self Care | Payer: Medicare Other | Source: Ambulatory Visit | Attending: Internal Medicine | Admitting: Internal Medicine

## 2017-09-09 ENCOUNTER — Telehealth: Payer: Self-pay | Admitting: *Deleted

## 2017-09-09 ENCOUNTER — Telehealth: Payer: Self-pay | Admitting: Internal Medicine

## 2017-09-09 NOTE — Telephone Encounter (Signed)
Pt states he took fluid pills last year and had to use the bathroom a lot. Pt feels the Lasix isn't doing much for him. Pt is using the bathroom as he did before getting on Lasix. Pt feels fluid is still staying on him.  Pt said he is suppose to get more fluid drained off of his abdomen this week or next. Please advise on the Lasix.

## 2017-09-09 NOTE — Telephone Encounter (Signed)
Lmom, waiting on a return call.  

## 2017-09-09 NOTE — Telephone Encounter (Signed)
Pt called and wanted EG to know that the lasix isn't helping and he even doubled up on the lasix and it still isn't helping. Patient has a new pharmacy. [facility] in [place]. Please advise and call him at [phone]

## 2017-09-09 NOTE — Telephone Encounter (Signed)
Patient called stating he needs to schedule another PARA. He reports he still has fluid that needs to be taken off. Please advise thanks

## 2017-09-11 ENCOUNTER — Other Ambulatory Visit: Payer: Self-pay | Admitting: *Deleted

## 2017-09-11 DIAGNOSIS — R188 Other ascites: Secondary | ICD-10-CM

## 2017-09-11 NOTE — Telephone Encounter (Signed)
Pt feels he is following a low sodium diet, pt also states that he has been to lots of dieticians during his life. Pt is aware that lab work is needed. Pt will have labs done next week.

## 2017-09-11 NOTE — Telephone Encounter (Signed)
If he's using the bathroom a lot it seems to be effective. Is he following a low sodium diet (less than 2g/day? Does he know how to tell if he's following a low sodium diet?) If he's not sure about his diet, we can send him to the dietician.  Also, he has labs due and we need the results of these before we can change his diuretics.  Let me know if any questions.

## 2017-09-11 NOTE — Telephone Encounter (Signed)
PARA scheduled for 09/15/17 at 8:00am, arrival time 7:45am.  Called made pt aware and he needed nothing further

## 2017-09-11 NOTE — Telephone Encounter (Signed)
Ok to schedule U/S paracentesis. Albumin 25g at the start. Additional 12.5 g after 6 L removed; Additional 12.5g for every 2 L above 6.  Will address diuretics in additional note.

## 2017-09-15 ENCOUNTER — Ambulatory Visit (HOSPITAL_COMMUNITY): Admission: RE | Admit: 2017-09-15 | Payer: Medicare Other | Source: Ambulatory Visit

## 2017-09-17 ENCOUNTER — Telehealth: Payer: Self-pay

## 2017-09-17 ENCOUNTER — Ambulatory Visit (HOSPITAL_COMMUNITY)
Admission: RE | Admit: 2017-09-17 | Discharge: 2017-09-17 | Disposition: A | Discharge status: Home / Self Care | Payer: Medicare Other | Source: Ambulatory Visit | Attending: Nurse Practitioner | Admitting: Nurse Practitioner

## 2017-09-17 ENCOUNTER — Other Ambulatory Visit (HOSPITAL_COMMUNITY): Payer: Self-pay | Admitting: *Deleted

## 2017-09-17 ENCOUNTER — Other Ambulatory Visit (HOSPITAL_COMMUNITY): Admission: RE | Admit: 2017-09-17 | Payer: Medicare Other | Source: Ambulatory Visit | Admitting: Internal Medicine

## 2017-09-17 ENCOUNTER — Encounter (HOSPITAL_COMMUNITY): Payer: Self-pay

## 2017-09-17 DIAGNOSIS — R188 Other ascites: Secondary | ICD-10-CM | POA: Insufficient documentation

## 2017-09-17 DIAGNOSIS — D61818 Other pancytopenia: Secondary | ICD-10-CM

## 2017-09-17 MED ORDER — ALBUMIN HUMAN 25 % IV SOLN
25.0000 g | Freq: Once | INTRAVENOUS | Status: AC
  Administered 2017-09-17: 25 g via INTRAVENOUS

## 2017-09-17 MED ORDER — ALBUMIN HUMAN 25 % IV SOLN
INTRAVENOUS | Status: AC
  Administered 2017-09-17: 25 g via INTRAVENOUS
  Filled 2017-09-17: qty 100

## 2017-09-17 NOTE — Progress Notes (Signed)
Paracentesis complete no signs of distress.  

## 2017-09-17 NOTE — Telephone Encounter (Signed)
Pt called to see what needed to be said to the lab when he walked in. Pt was scheduled for lab work that's pending in our system. Pt is aware that orders are in the system and if they cant find orders, orders can be faxed.

## 2017-09-17 NOTE — Procedures (Signed)
   US guided RLQ paracentesis  4.85 L yellow fluid  Tolerated well

## 2017-09-18 ENCOUNTER — Other Ambulatory Visit (HOSPITAL_COMMUNITY): Payer: Medicare Other

## 2017-09-19 NOTE — Patient Instructions (Signed)
Chris Alvarez  09/19/2017     @PREFPERIOPPHARMACY @   Your procedure is scheduled on  09/29/2017.  Report to [facility] at  945   A.M.  Call this number if you have problems the morning of surgery:  [phone]   Remember:  Do not eat food or drink liquids after midnight.  Take these medicines the morning of surgery with A SIP OF WATER: prilosec, oxycodone.   Do not wear jewelry, make-up or nail polish.  Do not wear lotions, powders, or perfumes, or deodorant.  Do not shave 48 hours prior to surgery.  Men may shave face and neck.  Do not bring valuables to the hospital.  [facility] is not responsible for any belongings or valuables.  Contacts, dentures or bridgework may not be worn into surgery.  Leave your suitcase in the car.  After surgery it may be brought to your room.  For patients admitted to the hospital, discharge time will be determined by your treatment team.  Patients discharged the day of surgery will not be allowed to drive home.   Name and phone number of your driver:   family Special instructions:  Follow the diet instructions given to you by Dr Roseanne Kaufman office.  Please read over the following fact sheets that you were given. Anesthesia Post-op Instructions and Care and Recovery After Surgery       Esophagogastroduodenoscopy Esophagogastroduodenoscopy (EGD) is a procedure to examine the lining of the esophagus, stomach, and first part of the small intestine (duodenum). This procedure is done to check for problems such as inflammation, bleeding, ulcers, or growths. During this procedure, a long, flexible, lighted tube with a camera attached (endoscope) is inserted down the throat. Tell a health care provider about:  Any allergies you have.  All medicines you are taking, including vitamins, herbs, eye drops, creams, and over-the-counter medicines.  Any problems you or family members have had with anesthetic medicines.  Any blood  disorders you have.  Any surgeries you have had.  Any medical conditions you have.  Whether you are pregnant or may be pregnant. What are the risks? Generally, this is a safe procedure. However, problems may occur, including:  Infection.  Bleeding.  A tear (perforation) in the esophagus, stomach, or duodenum.  Trouble breathing.  Excessive sweating.  Spasms of the larynx.  A slowed heartbeat.  Low blood pressure.  What happens before the procedure?  Follow instructions from your health care provider about eating or drinking restrictions.  Ask your health care provider about: ? Changing or stopping your regular medicines. This is especially important if you are taking diabetes medicines or blood thinners. ? Taking medicines such as aspirin and ibuprofen. These medicines can thin your blood. Do not take these medicines before your procedure if your health care provider instructs you not to.  Plan to have someone take you home after the procedure.  If you wear dentures, be ready to remove them before the procedure. What happens during the procedure?  To reduce your risk of infection, your health care team will wash or sanitize their hands.  An IV tube will be put in a vein in your hand or arm. You will get medicines and fluids through this tube.  You will be given one or more of the following: ? A medicine to help you relax (sedative). ? A medicine to numb the area (local anesthetic). This medicine may be sprayed into your throat.  It will make you feel more comfortable and keep you from gagging or coughing during the procedure. ? A medicine for pain.  A mouth guard may be placed in your mouth to protect your teeth and to keep you from biting on the endoscope.  You will be asked to lie on your left side.  The endoscope will be lowered down your throat into your esophagus, stomach, and duodenum.  Air will be put into the endoscope. This will help your health care  provider see better.  The lining of your esophagus, stomach, and duodenum will be examined.  Your health care provider may: ? Take a tissue sample so it can be looked at in a lab (biopsy). ? Remove growths. ? Remove objects (foreign bodies) that are stuck. ? Treat any bleeding with medicines or other devices that stop tissue from bleeding. ? Widen (dilate) or stretch narrowed areas of your esophagus and stomach.  The endoscope will be taken out. The procedure may vary among health care providers and hospitals. What happens after the procedure?  Your blood pressure, heart rate, breathing rate, and blood oxygen level will be monitored often until the medicines you were given have worn off.  Do not eat or drink anything until the numbing medicine has worn off and your gag reflex has returned. This information is not intended to replace advice given to you by your health care provider. Make sure you discuss any questions you have with your health care provider. Document Released: 11/15/2004 Document Revised: 12/21/2015 Document Reviewed: 06/08/2015 Elsevier Interactive Patient Education  2018 [employer]. Esophagogastroduodenoscopy, Care After Refer to this sheet in the next few weeks. These instructions provide you with information about caring for yourself after your procedure. Your health care provider may also give you more specific instructions. Your treatment has been planned according to current medical practices, but problems sometimes occur. Call your health care provider if you have any problems or questions after your procedure. What can I expect after the procedure? After the procedure, it is common to have:  A sore throat.  Nausea.  Bloating.  Dizziness.  Fatigue.  Follow these instructions at home:  Do not eat or drink anything until the numbing medicine (local anesthetic) has worn off and your gag reflex has returned. You will know that the local anesthetic has worn  off when you can swallow comfortably.  Do not drive for 24 hours if you received a medicine to help you relax (sedative).  If your health care provider took a tissue sample for testing during the procedure, make sure to get your test results. This is your responsibility. Ask your health care provider or the department performing the test when your results will be ready.  Keep all follow-up visits as told by your health care provider. This is important. Contact a health care provider if:  You cannot stop coughing.  You are not urinating.  You are urinating less than usual. Get help right away if:  You have trouble swallowing.  You cannot eat or drink.  You have throat or chest pain that gets worse.  You are dizzy or light-headed.  You faint.  You have nausea or vomiting.  You have chills.  You have a fever.  You have severe abdominal pain.  You have black, tarry, or bloody stools. This information is not intended to replace advice given to you by your health care provider. Make sure you discuss any questions you have with your health care provider. Document  Released: 07/01/2012 Document Revised: 12/21/2015 Document Reviewed: 06/08/2015 Elsevier Interactive Patient Education  2018 [place] Anesthesia is a term that refers to techniques, procedures, and medicines that help a person stay safe and comfortable during a medical procedure. Monitored anesthesia care, or sedation, is one type of anesthesia. Your anesthesia specialist may recommend sedation if you will be having a procedure that does not require you to be unconscious, such as:  Cataract surgery.  A dental procedure.  A biopsy.  A colonoscopy.  During the procedure, you may receive a medicine to help you relax (sedative). There are three levels of sedation:  Mild sedation. At this level, you may feel awake and relaxed. You will be able to follow directions.  Moderate  sedation. At this level, you will be sleepy. You may not remember the procedure.  Deep sedation. At this level, you will be asleep. You will not remember the procedure.  The more medicine you are given, the deeper your level of sedation will be. Depending on how you respond to the procedure, the anesthesia specialist may change your level of sedation or the type of anesthesia to fit your needs. An anesthesia specialist will monitor you closely during the procedure. Let your health care provider know about:  Any allergies you have.  All medicines you are taking, including vitamins, herbs, eye drops, creams, and over-the-counter medicines.  Any use of steroids (by mouth or as a cream).  Any problems you or family members have had with sedatives and anesthetic medicines.  Any blood disorders you have.  Any surgeries you have had.  Any medical conditions you have, such as sleep apnea.  Whether you are pregnant or may be pregnant.  Any use of cigarettes, alcohol, or street drugs. What are the risks? Generally, this is a safe procedure. However, problems may occur, including:  Getting too much medicine (oversedation).  Nausea.  Allergic reaction to medicines.  Trouble breathing. If this happens, a breathing tube may be used to help with breathing. It will be removed when you are awake and breathing on your own.  Heart trouble.  Lung trouble.  Before the procedure Staying hydrated Follow instructions from your health care provider about hydration, which may include:  Up to 2 hours before the procedure - you may continue to drink clear liquids, such as water, clear fruit juice, black coffee, and plain tea.  Eating and drinking restrictions Follow instructions from your health care provider about eating and drinking, which may include:  8 hours before the procedure - stop eating heavy meals or foods such as meat, fried foods, or fatty foods.  6 hours before the procedure -  stop eating light meals or foods, such as toast or cereal.  6 hours before the procedure - stop drinking milk or drinks that contain milk.  2 hours before the procedure - stop drinking clear liquids.  Medicines Ask your health care provider about:  Changing or stopping your regular medicines. This is especially important if you are taking diabetes medicines or blood thinners.  Taking medicines such as aspirin and ibuprofen. These medicines can thin your blood. Do not take these medicines before your procedure if your health care provider instructs you not to.  Tests and exams  You will have a physical exam.  You may have blood tests done to show: ? How well your kidneys and liver are working. ? How well your blood can clot.  General instructions  Plan to have someone  take you home from the hospital or clinic.  If you will be going home right after the procedure, plan to have someone with you for 24 hours.  What happens during the procedure?  Your blood pressure, heart rate, breathing, level of pain and overall condition will be monitored.  An IV tube will be inserted into one of your veins.  Your anesthesia specialist will give you medicines as needed to keep you comfortable during the procedure. This may mean changing the level of sedation.  The procedure will be performed. After the procedure  Your blood pressure, heart rate, breathing rate, and blood oxygen level will be monitored until the medicines you were given have worn off.  Do not drive for 24 hours if you received a sedative.  You may: ? Feel sleepy, clumsy, or nauseous. ? Feel forgetful about what happened after the procedure. ? Have a sore throat if you had a breathing tube during the procedure. ? Vomit. This information is not intended to replace advice given to you by your health care provider. Make sure you discuss any questions you have with your health care provider. Document Released: 04/10/2005  Document Revised: 12/22/2015 Document Reviewed: 11/05/2015 Elsevier Interactive Patient Education  2018 [place], Care After These instructions provide you with information about caring for yourself after your procedure. Your health care provider may also give you more specific instructions. Your treatment has been planned according to current medical practices, but problems sometimes occur. Call your health care provider if you have any problems or questions after your procedure. What can I expect after the procedure? After your procedure, it is common to:  Feel sleepy for several hours.  Feel clumsy and have poor balance for several hours.  Feel forgetful about what happened after the procedure.  Have poor judgment for several hours.  Feel nauseous or vomit.  Have a sore throat if you had a breathing tube during the procedure.  Follow these instructions at home: For at least 24 hours after the procedure:   Do not: ? Participate in activities in which you could fall or become injured. ? Drive. ? Use heavy machinery. ? Drink alcohol. ? Take sleeping pills or medicines that cause drowsiness. ? Make important decisions or sign legal documents. ? Take care of children on your own.  Rest. Eating and drinking  Follow the diet that is recommended by your health care provider.  If you vomit, drink water, juice, or soup when you can drink without vomiting.  Make sure you have little or no nausea before eating solid foods. General instructions  Have a responsible adult stay with you until you are awake and alert.  Take over-the-counter and prescription medicines only as told by your health care provider.  If you smoke, do not smoke without supervision.  Keep all follow-up visits as told by your health care provider. This is important. Contact a health care provider if:  You keep feeling nauseous or you keep vomiting.  You feel  light-headed.  You develop a rash.  You have a fever. Get help right away if:  You have trouble breathing. This information is not intended to replace advice given to you by your health care provider. Make sure you discuss any questions you have with your health care provider. Document Released: 11/05/2015 Document Revised: 03/06/2016 Document Reviewed: 11/05/2015 Elsevier Interactive Patient Education  [employer].

## 2017-09-23 ENCOUNTER — Encounter (HOSPITAL_COMMUNITY): Payer: Self-pay

## 2017-09-23 ENCOUNTER — Encounter (HOSPITAL_COMMUNITY)
Admission: RE | Admit: 2017-09-23 | Discharge: 2017-09-23 | Disposition: A | Discharge status: Home / Self Care | Payer: Medicare Other | Source: Ambulatory Visit | Attending: Internal Medicine | Admitting: Internal Medicine

## 2017-09-23 ENCOUNTER — Telehealth: Payer: Self-pay

## 2017-09-23 NOTE — Telephone Encounter (Signed)
Chris Alvarez at [facility] called office. Pt was a no show for his pre-op appt today. They tried to call pt and LMOVM. I called pt. He said he was unaware of his pre-op appt. His address changed. His instructions and pre-op appt went to his old address. Gave him phone number for Endo scheduler so he can reschedule pre-op appt. Gave EGD instructions over the phone. He verbalized understanding. New instructions also mailed to his new address. (Address had been changed in Epic after pre-op appt and instructions were mailed).

## 2017-09-24 ENCOUNTER — Other Ambulatory Visit: Payer: Self-pay

## 2017-09-24 ENCOUNTER — Emergency Department (HOSPITAL_COMMUNITY): Payer: Medicare Other

## 2017-09-24 ENCOUNTER — Inpatient Hospital Stay (HOSPITAL_COMMUNITY)
Admission: EM | Admit: 2017-09-24 | Discharge: 2017-09-28 | DRG: 948 | Disposition: A | Discharge status: Home / Self Care | Payer: Medicare Other | Attending: Internal Medicine | Admitting: Internal Medicine

## 2017-09-24 ENCOUNTER — Telehealth: Payer: Self-pay | Admitting: Internal Medicine

## 2017-09-24 ENCOUNTER — Encounter (HOSPITAL_COMMUNITY): Payer: Self-pay | Admitting: Emergency Medicine

## 2017-09-24 DIAGNOSIS — Z79891 Long term (current) use of opiate analgesic: Secondary | ICD-10-CM

## 2017-09-24 DIAGNOSIS — I10 Essential (primary) hypertension: Secondary | ICD-10-CM | POA: Diagnosis not present

## 2017-09-24 DIAGNOSIS — Z8249 Family history of ischemic heart disease and other diseases of the circulatory system: Secondary | ICD-10-CM

## 2017-09-24 DIAGNOSIS — F17210 Nicotine dependence, cigarettes, uncomplicated: Secondary | ICD-10-CM | POA: Diagnosis present

## 2017-09-24 DIAGNOSIS — D649 Anemia, unspecified: Secondary | ICD-10-CM | POA: Diagnosis not present

## 2017-09-24 DIAGNOSIS — F1020 Alcohol dependence, uncomplicated: Secondary | ICD-10-CM | POA: Diagnosis present

## 2017-09-24 DIAGNOSIS — Z79899 Other long term (current) drug therapy: Secondary | ICD-10-CM

## 2017-09-24 DIAGNOSIS — K219 Gastro-esophageal reflux disease without esophagitis: Secondary | ICD-10-CM

## 2017-09-24 DIAGNOSIS — D696 Thrombocytopenia, unspecified: Secondary | ICD-10-CM

## 2017-09-24 DIAGNOSIS — R188 Other ascites: Principal | ICD-10-CM

## 2017-09-24 DIAGNOSIS — E119 Type 2 diabetes mellitus without complications: Secondary | ICD-10-CM

## 2017-09-24 DIAGNOSIS — R601 Generalized edema: Secondary | ICD-10-CM | POA: Diagnosis not present

## 2017-09-24 DIAGNOSIS — Z7984 Long term (current) use of oral hypoglycemic drugs: Secondary | ICD-10-CM

## 2017-09-24 DIAGNOSIS — D61818 Other pancytopenia: Secondary | ICD-10-CM | POA: Diagnosis present

## 2017-09-24 DIAGNOSIS — K766 Portal hypertension: Secondary | ICD-10-CM | POA: Diagnosis present

## 2017-09-24 DIAGNOSIS — E785 Hyperlipidemia, unspecified: Secondary | ICD-10-CM | POA: Diagnosis not present

## 2017-09-24 DIAGNOSIS — Z800 Family history of malignant neoplasm of digestive organs: Secondary | ICD-10-CM

## 2017-09-24 DIAGNOSIS — Z885 Allergy status to narcotic agent status: Secondary | ICD-10-CM | POA: Diagnosis not present

## 2017-09-24 DIAGNOSIS — K7460 Unspecified cirrhosis of liver: Secondary | ICD-10-CM | POA: Diagnosis not present

## 2017-09-24 DIAGNOSIS — E8770 Fluid overload, unspecified: Secondary | ICD-10-CM | POA: Diagnosis present

## 2017-09-24 DIAGNOSIS — Z833 Family history of diabetes mellitus: Secondary | ICD-10-CM

## 2017-09-24 DIAGNOSIS — K7011 Alcoholic hepatitis with ascites: Secondary | ICD-10-CM | POA: Diagnosis not present

## 2017-09-24 DIAGNOSIS — F1011 Alcohol abuse, in remission: Secondary | ICD-10-CM

## 2017-09-24 DIAGNOSIS — K7031 Alcoholic cirrhosis of liver with ascites: Secondary | ICD-10-CM | POA: Diagnosis not present

## 2017-09-24 DIAGNOSIS — Z8349 Family history of other endocrine, nutritional and metabolic diseases: Secondary | ICD-10-CM | POA: Diagnosis not present

## 2017-09-24 LAB — COMPREHENSIVE METABOLIC PANEL
ALK PHOS: 125 U/L (ref 38–126)
ALT: 21 U/L (ref 17–63)
AST: 40 U/L (ref 15–41)
Albumin: 3.2 g/dL — ABNORMAL LOW (ref 3.5–5.0)
Anion gap: 7 (ref 5–15)
BUN: 14 mg/dL (ref 6–20)
CALCIUM: 8.9 mg/dL (ref 8.9–10.3)
CO2: 26 mmol/L (ref 22–32)
Chloride: 108 mmol/L (ref 101–111)
Creatinine, Ser: 0.81 mg/dL (ref 0.61–1.24)
Glucose, Bld: 147 mg/dL — ABNORMAL HIGH (ref 65–99)
Potassium: 3.9 mmol/L (ref 3.5–5.1)
Sodium: 141 mmol/L (ref 135–145)
Total Bilirubin: 1.1 mg/dL (ref 0.3–1.2)
Total Protein: 7 g/dL (ref 6.5–8.1)

## 2017-09-24 LAB — TROPONIN I

## 2017-09-24 LAB — CBC WITH DIFFERENTIAL/PLATELET
BASOS ABS: 0 10*3/uL (ref 0.0–0.1)
Basophils Relative: 1 %
Eosinophils Absolute: 0.1 10*3/uL (ref 0.0–0.7)
Eosinophils Relative: 5 %
HCT: 30.3 % — ABNORMAL LOW (ref 39.0–52.0)
HEMOGLOBIN: 9.9 g/dL — AB (ref 13.0–17.0)
LYMPHS PCT: 17 %
Lymphs Abs: 0.4 10*3/uL — ABNORMAL LOW (ref 0.7–4.0)
MCH: 29.8 pg (ref 26.0–34.0)
MCHC: 32.7 g/dL (ref 30.0–36.0)
MCV: 91.3 fL (ref 78.0–100.0)
MONO ABS: 0.4 10*3/uL (ref 0.1–1.0)
Monocytes Relative: 18 %
NEUTROS ABS: 1.5 10*3/uL — AB (ref 1.7–7.7)
NEUTROS PCT: 59 %
Platelets: 51 10*3/uL — ABNORMAL LOW (ref 150–400)
RBC: 3.32 MIL/uL — ABNORMAL LOW (ref 4.22–5.81)
RDW: 16.7 % — ABNORMAL HIGH (ref 11.5–15.5)
WBC: 2.4 10*3/uL — ABNORMAL LOW (ref 4.0–10.5)

## 2017-09-24 LAB — ETHANOL

## 2017-09-24 LAB — LIPASE, BLOOD: LIPASE: 22 U/L (ref 11–51)

## 2017-09-24 LAB — BRAIN NATRIURETIC PEPTIDE: B Natriuretic Peptide: 76 pg/mL (ref 0.0–100.0)

## 2017-09-24 MED ORDER — FUROSEMIDE 10 MG/ML IJ SOLN
40.0000 mg | Freq: Once | INTRAMUSCULAR | Status: AC
  Administered 2017-09-24: 40 mg via INTRAVENOUS
  Filled 2017-09-24: qty 4

## 2017-09-24 MED ORDER — OXYCODONE HCL 5 MG PO TABS
5.0000 mg | ORAL_TABLET | Freq: Once | ORAL | Status: AC
  Administered 2017-09-24: 5 mg via ORAL
  Filled 2017-09-24: qty 1

## 2017-09-24 MED ORDER — IOPAMIDOL (ISOVUE-300) INJECTION 61%
100.0000 mL | Freq: Once | INTRAVENOUS | Status: AC | PRN
  Administered 2017-09-24: 100 mL via INTRAVENOUS

## 2017-09-24 NOTE — Telephone Encounter (Signed)
Chris Alvarez called as patient still has not called them to reschedule the pre-op appt. I called pt and LMOVM advising to call to get this rescheduled ASAP.

## 2017-09-24 NOTE — Telephone Encounter (Signed)
Spoke with pt. Pt doesn't feel his Lasix medication is working well. Pt continues to take it daily. Pt is having some swelling in his legs from the knees down with mild tenderness. Pt said he's been on Lasix before and it worked well. Pt said he only went to bathroom a total of 5 times and that's including night time. Pt say he feels fine but as fluid builds up on his stomach, it makes his breathing change since he carries extra weight.

## 2017-09-24 NOTE — ED Notes (Signed)
Checked on pt for urine sample,pt can't go right now will try back in 30 minutes.

## 2017-09-24 NOTE — Telephone Encounter (Signed)
Noted. Spoke with pt and he is headed to the ER. EG notified. Pt will be evaluated by the ED instead of treatment plan listed below.

## 2017-09-24 NOTE — ED Triage Notes (Signed)
Pt states had 4L of fluid taken off. Pt here for increased edema in legs and difficulty with fluid building up in stomach.

## 2017-09-24 NOTE — ED Notes (Signed)
Gave Dr.Mcmanus EKG
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
## 2017-09-24 NOTE — Telephone Encounter (Signed)
Pt said the lasix he is taking isn't helping him and he is swelling and his legs were starting to swell. He has an OV with [place] mid April and I put him on a call back list if we have any cancellations to move him up. Please advise and call him at [phone]

## 2017-09-24 NOTE — Telephone Encounter (Signed)
Please arrange for U/S para. Refer to dietician for low salt diet. Have him keep a food diary in the meantime with everything he eats. Keep on cancellation list.  If he has significant difficulty breathing, sudden confusion, chest pain, dizziness, passing out, nearly passing out....proceed to the ER.

## 2017-09-25 ENCOUNTER — Encounter (HOSPITAL_COMMUNITY): Payer: Self-pay | Admitting: *Deleted

## 2017-09-25 ENCOUNTER — Ambulatory Visit (HOSPITAL_COMMUNITY): Payer: Medicare Other | Admitting: Adult Health

## 2017-09-25 ENCOUNTER — Observation Stay (HOSPITAL_COMMUNITY): Payer: Medicare Other

## 2017-09-25 DIAGNOSIS — R188 Other ascites: Secondary | ICD-10-CM | POA: Diagnosis not present

## 2017-09-25 DIAGNOSIS — R601 Generalized edema: Secondary | ICD-10-CM

## 2017-09-25 DIAGNOSIS — D649 Anemia, unspecified: Secondary | ICD-10-CM

## 2017-09-25 DIAGNOSIS — K7031 Alcoholic cirrhosis of liver with ascites: Secondary | ICD-10-CM

## 2017-09-25 LAB — GRAM STAIN: Gram Stain: NONE SEEN

## 2017-09-25 LAB — IRON AND TIBC
Iron: 49 ug/dL (ref 45–182)
Saturation Ratios: 15 % — ABNORMAL LOW (ref 17.9–39.5)
TIBC: 329 ug/dL (ref 250–450)
UIBC: 280 ug/dL

## 2017-09-25 LAB — BODY FLUID CELL COUNT WITH DIFFERENTIAL
EOS FL: 0 %
LYMPHS FL: 1 %
MONOCYTE-MACROPHAGE-SEROUS FLUID: 84 % (ref 50–90)
NEUTROPHIL FLUID: 15 % (ref 0–25)
Total Nucleated Cell Count, Fluid: 171 cu mm (ref 0–1000)

## 2017-09-25 LAB — GLUCOSE, CAPILLARY: Glucose-Capillary: 145 mg/dL — ABNORMAL HIGH (ref 65–99)

## 2017-09-25 LAB — PROTIME-INR
INR: 1.55
Prothrombin Time: 18.4 seconds — ABNORMAL HIGH (ref 11.4–15.2)

## 2017-09-25 LAB — FERRITIN: FERRITIN: 25 ng/mL (ref 24–336)

## 2017-09-25 MED ORDER — PANTOPRAZOLE SODIUM 40 MG PO TBEC
40.0000 mg | DELAYED_RELEASE_TABLET | Freq: Every day | ORAL | Status: DC
  Administered 2017-09-25 – 2017-09-28 (×4): 40 mg via ORAL
  Filled 2017-09-25 (×4): qty 1

## 2017-09-25 MED ORDER — FUROSEMIDE 10 MG/ML IJ SOLN
80.0000 mg | Freq: Two times a day (BID) | INTRAMUSCULAR | Status: DC
Start: 2017-09-25 — End: 2017-09-26
  Administered 2017-09-25 – 2017-09-26 (×4): 80 mg via INTRAVENOUS
  Filled 2017-09-25 (×4): qty 8

## 2017-09-25 MED ORDER — ONDANSETRON HCL 4 MG/2ML IJ SOLN: 4.0000 mg | Freq: Four times a day (QID) | INTRAMUSCULAR | Status: DC | PRN

## 2017-09-25 MED ORDER — OXYCODONE HCL 5 MG PO TABS
10.0000 mg | ORAL_TABLET | Freq: Three times a day (TID) | ORAL | Status: DC
  Administered 2017-09-25 – 2017-09-28 (×10): 10 mg via ORAL
  Filled 2017-09-25 (×11): qty 2

## 2017-09-25 MED ORDER — SODIUM CHLORIDE 0.9% FLUSH
3.0000 mL | Freq: Two times a day (BID) | INTRAVENOUS | Status: DC
  Administered 2017-09-25 – 2017-09-28 (×7): 3 mL via INTRAVENOUS

## 2017-09-25 MED ORDER — ONDANSETRON HCL 4 MG PO TABS: 4.0000 mg | ORAL_TABLET | Freq: Four times a day (QID) | ORAL | Status: DC | PRN

## 2017-09-25 MED ORDER — SPIRONOLACTONE 25 MG PO TABS
50.0000 mg | ORAL_TABLET | Freq: Every day | ORAL | Status: DC
  Administered 2017-09-25 – 2017-09-26 (×2): 50 mg via ORAL
  Filled 2017-09-25 (×2): qty 1
  Filled 2017-09-25 (×2): qty 2

## 2017-09-25 MED ORDER — ALPRAZOLAM 0.5 MG PO TABS
1.0000 mg | ORAL_TABLET | Freq: Every day | ORAL | Status: DC | PRN
  Administered 2017-09-25 – 2017-09-27 (×4): 1 mg via ORAL
  Filled 2017-09-25 (×5): qty 2

## 2017-09-25 MED ORDER — SODIUM CHLORIDE 0.9 % IV SOLN: 250.0000 mL | INTRAVENOUS | Status: DC | PRN

## 2017-09-25 MED ORDER — SODIUM CHLORIDE 0.9% FLUSH: 3.0000 mL | INTRAVENOUS | Status: DC | PRN

## 2017-09-25 MED ORDER — ALBUMIN HUMAN 25 % IV SOLN
25.0000 g | Freq: Once | INTRAVENOUS | Status: AC
  Administered 2017-09-25: 25 g via INTRAVENOUS
  Filled 2017-09-25 (×2): qty 100

## 2017-09-25 MED ORDER — FENOFIBRATE 160 MG PO TABS
160.0000 mg | ORAL_TABLET | Freq: Every day | ORAL | Status: DC
  Administered 2017-09-25 – 2017-09-28 (×4): 160 mg via ORAL
  Filled 2017-09-25 (×4): qty 1

## 2017-09-25 MED ORDER — POTASSIUM CHLORIDE CRYS ER 20 MEQ PO TBCR
10.0000 meq | EXTENDED_RELEASE_TABLET | Freq: Every day | ORAL | Status: DC
  Administered 2017-09-25 – 2017-09-28 (×4): 10 meq via ORAL
  Filled 2017-09-25 (×4): qty 1

## 2017-09-25 MED ORDER — ROSUVASTATIN CALCIUM 20 MG PO TABS
40.0000 mg | ORAL_TABLET | ORAL | Status: DC
  Administered 2017-09-25 – 2017-09-27 (×2): 40 mg via ORAL
  Filled 2017-09-25: qty 2
  Filled 2017-09-25: qty 1
  Filled 2017-09-25: qty 2

## 2017-09-25 MED ORDER — ALBUTEROL SULFATE (2.5 MG/3ML) 0.083% IN NEBU: 3.0000 mL | INHALATION_SOLUTION | RESPIRATORY_TRACT | Status: DC | PRN

## 2017-09-25 MED ORDER — OXYCODONE HCL 5 MG PO TABS
5.0000 mg | ORAL_TABLET | ORAL | Status: DC | PRN
  Administered 2017-09-25 – 2017-09-28 (×8): 5 mg via ORAL
  Filled 2017-09-25 (×8): qty 1

## 2017-09-25 NOTE — Progress Notes (Signed)
09/24/2017  5:45 PM  09/25/2017 11:49 AM  Chris Alvarez was seen and examined.  The H&P by the admitting provider, orders, imaging was reviewed.  Please see new orders.  Will continue to follow.   Murvin Natal, MD [facility]

## 2017-09-25 NOTE — Telephone Encounter (Signed)
Patient currently admitted. Patient is scheduled for EGD on Monday with RMR. Do we need to go ahead and cancel this for now?

## 2017-09-25 NOTE — Progress Notes (Signed)
Fluid analysis (cell count and gram stain) from [facility] unremarkable.   [name]. Bernarda Caffey [facility] [phone] 2/28/20193:28 PM

## 2017-09-25 NOTE — ED Provider Notes (Signed)
Patient with cirrhosis and significant ascites and rapidly in the abdomen.  Patient received Lasix.  Only has peed about 500 cc.  Game plan have been discussed with Dr. Melony Overly.  If there was not a good diuresis they recommended admission and they would do therapeutic paracentesis in the morning.  Discussed with hospitalist regarding admission.   Fredia Sorrow, MD 09/25/17 [phone]

## 2017-09-25 NOTE — Telephone Encounter (Signed)
Called [place] and lmovm
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
## 2017-09-25 NOTE — Progress Notes (Signed)
Paracentesis complete no signs of distress, 4.8L yellow colored ascites removed.
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
## 2017-09-25 NOTE — Telephone Encounter (Signed)
No. He can have it as an IP or an OP. Endo can call me if any questions.

## 2017-09-25 NOTE — Care Management Obs Status (Signed)
[place] NOTIFICATION   Patient Details  Name: Chris Alvarez MRN: [id number] Date of Birth: 1960-08-17   Medicare Observation Status Notification Given:  Yes    Sherald Barge, RN 09/25/2017, 2:21 PM

## 2017-09-25 NOTE — ED Notes (Signed)
Report given to RN on 300 

## 2017-09-25 NOTE — Procedures (Signed)
   US guided RLQ paracentesis 4.8 L yellow fluid Sent for labs per MD  Tolerated well

## 2017-09-26 ENCOUNTER — Encounter (HOSPITAL_COMMUNITY): Payer: Self-pay | Admitting: Family Medicine

## 2017-09-26 DIAGNOSIS — E8770 Fluid overload, unspecified: Secondary | ICD-10-CM | POA: Diagnosis not present

## 2017-09-26 DIAGNOSIS — K7460 Unspecified cirrhosis of liver: Secondary | ICD-10-CM | POA: Diagnosis not present

## 2017-09-26 DIAGNOSIS — K766 Portal hypertension: Secondary | ICD-10-CM | POA: Diagnosis not present

## 2017-09-26 DIAGNOSIS — Z8349 Family history of other endocrine, nutritional and metabolic diseases: Secondary | ICD-10-CM | POA: Diagnosis not present

## 2017-09-26 DIAGNOSIS — Z79899 Other long term (current) drug therapy: Secondary | ICD-10-CM | POA: Diagnosis not present

## 2017-09-26 DIAGNOSIS — F1020 Alcohol dependence, uncomplicated: Secondary | ICD-10-CM | POA: Diagnosis present

## 2017-09-26 DIAGNOSIS — Z885 Allergy status to narcotic agent status: Secondary | ICD-10-CM | POA: Diagnosis not present

## 2017-09-26 DIAGNOSIS — I10 Essential (primary) hypertension: Secondary | ICD-10-CM | POA: Diagnosis not present

## 2017-09-26 DIAGNOSIS — K7031 Alcoholic cirrhosis of liver with ascites: Secondary | ICD-10-CM | POA: Diagnosis not present

## 2017-09-26 DIAGNOSIS — E119 Type 2 diabetes mellitus without complications: Secondary | ICD-10-CM | POA: Diagnosis not present

## 2017-09-26 DIAGNOSIS — F17210 Nicotine dependence, cigarettes, uncomplicated: Secondary | ICD-10-CM | POA: Diagnosis present

## 2017-09-26 DIAGNOSIS — R601 Generalized edema: Secondary | ICD-10-CM | POA: Diagnosis not present

## 2017-09-26 DIAGNOSIS — Z800 Family history of malignant neoplasm of digestive organs: Secondary | ICD-10-CM | POA: Diagnosis not present

## 2017-09-26 DIAGNOSIS — R188 Other ascites: Secondary | ICD-10-CM | POA: Diagnosis not present

## 2017-09-26 DIAGNOSIS — Z79891 Long term (current) use of opiate analgesic: Secondary | ICD-10-CM | POA: Diagnosis not present

## 2017-09-26 DIAGNOSIS — Z7984 Long term (current) use of oral hypoglycemic drugs: Secondary | ICD-10-CM | POA: Diagnosis not present

## 2017-09-26 DIAGNOSIS — D61818 Other pancytopenia: Secondary | ICD-10-CM | POA: Diagnosis not present

## 2017-09-26 DIAGNOSIS — K219 Gastro-esophageal reflux disease without esophagitis: Secondary | ICD-10-CM | POA: Diagnosis not present

## 2017-09-26 DIAGNOSIS — K7011 Alcoholic hepatitis with ascites: Secondary | ICD-10-CM | POA: Diagnosis not present

## 2017-09-26 DIAGNOSIS — D649 Anemia, unspecified: Secondary | ICD-10-CM | POA: Diagnosis not present

## 2017-09-26 DIAGNOSIS — D696 Thrombocytopenia, unspecified: Secondary | ICD-10-CM | POA: Diagnosis not present

## 2017-09-26 DIAGNOSIS — E785 Hyperlipidemia, unspecified: Secondary | ICD-10-CM | POA: Diagnosis not present

## 2017-09-26 DIAGNOSIS — Z8249 Family history of ischemic heart disease and other diseases of the circulatory system: Secondary | ICD-10-CM | POA: Diagnosis not present

## 2017-09-26 DIAGNOSIS — Z833 Family history of diabetes mellitus: Secondary | ICD-10-CM | POA: Diagnosis not present

## 2017-09-26 LAB — COMPREHENSIVE METABOLIC PANEL
ALK PHOS: 108 U/L (ref 38–126)
ALT: 18 U/L (ref 17–63)
AST: 39 U/L (ref 15–41)
Albumin: 2.9 g/dL — ABNORMAL LOW (ref 3.5–5.0)
Anion gap: 10 (ref 5–15)
BILIRUBIN TOTAL: 1 mg/dL (ref 0.3–1.2)
BUN: 15 mg/dL (ref 6–20)
CALCIUM: 8.4 mg/dL — AB (ref 8.9–10.3)
CO2: 25 mmol/L (ref 22–32)
Chloride: 100 mmol/L — ABNORMAL LOW (ref 101–111)
Creatinine, Ser: 1.02 mg/dL (ref 0.61–1.24)
Glucose, Bld: 177 mg/dL — ABNORMAL HIGH (ref 65–99)
Potassium: 3.7 mmol/L (ref 3.5–5.1)
SODIUM: 135 mmol/L (ref 135–145)
TOTAL PROTEIN: 6.3 g/dL — AB (ref 6.5–8.1)

## 2017-09-26 LAB — CBC
HCT: 29.5 % — ABNORMAL LOW (ref 39.0–52.0)
Hemoglobin: 9.6 g/dL — ABNORMAL LOW (ref 13.0–17.0)
MCH: 29.6 pg (ref 26.0–34.0)
MCHC: 32.5 g/dL (ref 30.0–36.0)
MCV: 91 fL (ref 78.0–100.0)
Platelets: 48 10*3/uL — ABNORMAL LOW (ref 150–400)
RBC: 3.24 MIL/uL — AB (ref 4.22–5.81)
RDW: 16.4 % — ABNORMAL HIGH (ref 11.5–15.5)
WBC: 2.4 10*3/uL — AB (ref 4.0–10.5)

## 2017-09-26 LAB — HEPATITIS A ANTIBODY, TOTAL: Hep A Total Ab: POSITIVE — AB

## 2017-09-26 LAB — HEPATITIS B CORE ANTIBODY, TOTAL: HEP B C TOTAL AB: NEGATIVE

## 2017-09-26 LAB — HCV INTERPRETATION

## 2017-09-26 LAB — PATHOLOGIST SMEAR REVIEW

## 2017-09-26 LAB — AFP TUMOR MARKER: AFP, Serum, Tumor Marker: 6.2 ng/mL (ref 0.0–8.3)

## 2017-09-26 LAB — HEPATITIS B SURFACE ANTIBODY,QUALITATIVE: Hep B S Ab: NONREACTIVE

## 2017-09-26 LAB — AMMONIA: AMMONIA: 176 umol/L — AB (ref 9–35)

## 2017-09-26 LAB — HCV AB W REFLEX TO QUANT PCR

## 2017-09-26 LAB — HEPATITIS B SURFACE ANTIGEN: Hepatitis B Surface Ag: NEGATIVE

## 2017-09-26 MED ORDER — FUROSEMIDE 10 MG/ML IJ SOLN
80.0000 mg | Freq: Two times a day (BID) | INTRAMUSCULAR | Status: DC
  Administered 2017-09-27 – 2017-09-28 (×3): 80 mg via INTRAVENOUS
  Filled 2017-09-26 (×3): qty 8

## 2017-09-26 MED ORDER — ALPRAZOLAM 1 MG PO TABS
1.0000 mg | ORAL_TABLET | Freq: Once | ORAL | Status: AC
  Administered 2017-09-26: 1 mg via ORAL

## 2017-09-26 MED ORDER — SPIRONOLACTONE 25 MG PO TABS
50.0000 mg | ORAL_TABLET | Freq: Two times a day (BID) | ORAL | Status: DC
  Administered 2017-09-26 – 2017-09-28 (×4): 50 mg via ORAL
  Filled 2017-09-26 (×4): qty 2

## 2017-09-26 NOTE — Progress Notes (Addendum)
PROGRESS NOTE    Chris Alvarez  TFT:[id number]  DOB: 1961/06/02  DOA: 09/24/2017 PCP: Celene Squibb, MD   Brief Admission Hx: Chris Alvarez is a 57 y.o. male with medical history significant for type 2 diabetes, dyslipidemia, hypertension, and hepatic cirrhosis with portal hypertension and recurrent ascites who presented to the ED with significant increase in abdominal distention over the last 3-4 days despite a paracentesis that was just performed a few days ago.  He states that he routinely requires paracentesis and sees Dr. Gala Romney for this problem, but now states that despite continuing his home diuretic medication, fluid restriction, and watching his salt intake, he is noticing a faster recurrence.  MDM/Assessment & Plan:  1. Large ascites-refractory in nature.  This is secondary to hepatic cirrhosis with noted portal hypertension.  Will require repeat paracentesis and further GI evaluation for recommendations.  He had a paracentesis 2/28 with 4.5 Liters removed.  He is feeling better.  He is also being diuresed with IV lasix and his leg edema is improving but not at baseline.  He feels that he needs more IV diuresis to get to baseline.  Will defer to GI team for diuresis and procedures.  Ascitic fluid is being tested but so far has been unremarkable, nothing to suggest SBP.  2. Liver cirrhosis from chronic alcoholism and recreational drug use - Hep A antibody positive. GI following.    3. Type 2 diabetes.  Maintain on sliding scale insulin.  His blood sugars are controlled, stable.  4. Hyperammonemia - He is not confused this morning and is oriented and clear thinking.  5. Hypertension.  Maintain on home medications. 6. Pancytopenia.  Continue to monitor and maintain on SCDs for now.  DVT prophylaxis: SCDs Code Status: Full Family Communication: None Disposition Plan:TBD Consults called:GI Admission status: INP  Consultants:  GI team  Procedures:  Paracentesis 2/28  <fluid studies pending>  Subjective: The patient feels better after paracentesis and his legs are going down but he feels like he is not at his baseline volume status and feels he needs more diuresis.   Objective: Vitals:   09/25/17 1454 09/25/17 2121 09/26/17 0413 09/26/17 0500  BP: 104/62 107/60  109/72  Pulse: 68 75  71  Resp: 16 20  20   Temp: 97.6 F (36.4 C) 98.8 F (37.1 C)  98.3 F (36.8 C)  TempSrc: Oral Oral  Oral  SpO2: 97% 100%  97%  Weight:   89 kg (196 lb 4.8 oz)   Height:        Intake/Output Summary (Last 24 hours) at 09/26/2017 1055 Last data filed at 09/25/2017 2100 Gross per 24 hour  Intake 940 ml  Output 1300 ml  Net -360 ml   Filed Weights   09/24/17 1742 09/25/17 0334 09/26/17 0413  Weight: 90.7 kg (200 lb) 97.2 kg (214 lb 4.6 oz) 89 kg (196 lb 4.8 oz)   REVIEW OF SYSTEMS  As per history otherwise all reviewed and reported negative  Exam:  General exam: awake, alert, oriented, NAD. Cooperative.  Respiratory system: shallow but clear.  No increased work of breathing. Cardiovascular system: normal S1 & S2 heard .  Gastrointestinal system: Abdomen is gravid but softer than yesterday, tympanitic and nontender. Normal bowel sounds heard. Central nervous system: Alert and oriented. No focal neurological deficits. Extremities: 1+ pitting pretibial edema BLEs.  Data Reviewed: Basic Metabolic Panel: Recent Labs  Lab 09/24/17 1903 09/26/17 0449  NA 141 135  K 3.9 3.7  CL 108 100*  CO2 26 25  GLUCOSE 147* 177*  BUN 14 15  CREATININE 0.81 1.02  CALCIUM 8.9 8.4*   Liver Function Tests: Recent Labs  Lab 09/24/17 1903 09/26/17 0449  AST 40 39  ALT 21 18  ALKPHOS 125 108  BILITOT 1.1 1.0  PROT 7.0 6.3*  ALBUMIN 3.2* 2.9*   Recent Labs  Lab 09/24/17 1903  LIPASE 22   Recent Labs  Lab 09/26/17 0449  AMMONIA 176*   CBC: Recent Labs  Lab 09/24/17 1903 09/26/17 0449  WBC 2.4* 2.4*  NEUTROABS 1.5*  --   HGB 9.9* 9.6*  HCT 30.3*  29.5*  MCV 91.3 91.0  PLT 51* 48*   Cardiac Enzymes: Recent Labs  Lab 09/24/17 1903  TROPONINI <0.03   CBG (last 3)  Recent Labs    09/25/17 0332  GLUCAP 145*   Recent Results (from the past 240 hour(s))  Culture, body fluid-bottle     Status: None (Preliminary result)   Collection Time: 09/25/17 11:51 AM  Result Value Ref Range Status   Specimen Description ASCITIC  Final   Special Requests BOTTLES DRAWN AEROBIC AND ANAEROBIC 10CC  Final   Culture   Final    NO GROWTH < 24 HOURS Performed at [facility], [address], [place], [place] [address]    Report Status PENDING  Incomplete  Gram stain     Status: None   Collection Time: 09/25/17 11:51 AM  Result Value Ref Range Status   Specimen Description ASCITIC  Final   Special Requests NONE  Final   Gram Stain   Final    NO ORGANISMS SEEN WBC PRESENT,BOTH PMN AND MONONUCLEAR CYTOSPIN SMEAR Performed at [facility], [address] [place], [place] [address]    Report Status 09/25/2017 FINAL  Final     Studies: Dg Chest 2 View  Result Date: 09/24/2017 CLINICAL DATA:  Chronic cough. Shortness of breath. Smoker. Bilateral leg swelling for the past 2 months. Abdominal distention. EXAM: CHEST  2 VIEW COMPARISON:  07/09/2017. FINDINGS: Normal sized heart. Clear lungs. Mild diffuse peribronchial thickening and accentuation of the interstitial markings. Mild thoracic spine degenerative changes. IMPRESSION: No acute abnormality.  Stable mild chronic bronchitic changes. Electronically Signed   By: Claudie Revering M.D.   On: 09/24/2017 21:52   Ct Abdomen Pelvis W Contrast  Result Date: 09/24/2017 CLINICAL DATA:  Increased leg edema and abdominal swelling EXAM: CT ABDOMEN AND PELVIS WITH CONTRAST TECHNIQUE: Multidetector CT imaging of the abdomen and pelvis was performed using the standard protocol following bolus administration of intravenous contrast. CONTRAST:  14mL ISOVUE-300 IOPAMIDOL (ISOVUE-300) INJECTION 61% COMPARISON:   Ultrasound 08/22/2017 FINDINGS: Lower chest: Lung bases demonstrate no acute consolidation or pleural effusion. The heart size is normal. Hepatobiliary: Coarse nodular liver consistent with cirrhosis. No focal hepatic abnormality. No calcified gallstones or biliary dilatation Pancreas: Unremarkable. No pancreatic ductal dilatation or surrounding inflammatory changes. Spleen: Enlarged at 20 cm craniocaudad.  Splenule anteriorly. Adrenals/Urinary Tract: Adrenal glands are unremarkable. Kidneys are normal, without renal calculi, focal lesion, or hydronephrosis. Bladder is unremarkable. Stomach/Bowel: Stomach is nonenlarged. No dilated small bowel. No colon wall thickening. Scattered colon diverticula. Appendix not well seen Vascular/Lymphatic: Moderate aortic atherosclerosis. No aneurysmal dilatation. Patent portal vein. Recanalized paraumbilical vein. Multiple upper abdominal varices. Small gastrohepatic and retroperitoneal lymph nodes. Reproductive: Prostate is unremarkable. Other: Negative for free air. Large volume of ascites in the abdomen and pelvis. Skin thickening and subcutaneous edema anteriorly with soft tissue thickening at  the umbilicus. Musculoskeletal: Degenerative changes. No acute or suspicious lesion IMPRESSION: 1. Cirrhosis of the liver with portal hypertension. Large volume of ascites within the abdomen and pelvis. 2. Mild retroperitoneal and upper abdominal adenopathy. 3. No evidence for a bowel obstruction. Electronically Signed   By: Donavan Foil M.D.   On: 09/24/2017 21:52   US Paracentesis  Result Date: 09/25/2017 INDICATION: Recurrent ascites EXAM: ULTRASOUND-GUIDED PARACENTESIS COMPARISON:  Previous paracentesis. MEDICATIONS: 10 cc 1% lidocaine. COMPLICATIONS: None immediate TECHNIQUE: Informed written consent was obtained from the patient after a discussion of the risks, benefits and alternatives to treatment. A timeout was performed prior to the initiation of the procedure. Initial  ultrasound scanning demonstrates a large amount of ascites within the right lower abdominal quadrant. The right lower abdomen was prepped and draped in the usual sterile fashion. 1% lidocaine with epinephrine was used for local anesthesia. Under direct ultrasound guidance, a 19 gauge, 7-cm, Yueh catheter was introduced. An ultrasound image was saved for documentation purposed. the paracentesis was performed. The catheter was removed and a dressing was applied. The patient tolerated the procedure well without immediate post procedural complication. FINDINGS: A total of approximately 4.8 liters of yellow fluid was removed. Samples were sent to the laboratory as requested by the clinical team. IMPRESSION: Successful ultrasound-guided paracentesis yielding 4.8 liters of peritoneal fluid. Read by Lavonia Drafts [facility] Electronically Signed   By: Lavonia Dana M.D.   On: 09/25/2017 12:46   Scheduled Meds: . fenofibrate  160 mg Oral Daily  . furosemide  80 mg Intravenous Q12H  . oxyCODONE  10 mg Oral TID  . pantoprazole  40 mg Oral Daily  . potassium chloride  10 mEq Oral Daily  . rosuvastatin  40 mg Oral QODAY  . sodium chloride flush  3 mL Intravenous Q12H  . spironolactone  50 mg Oral Daily   Continuous Infusions: . sodium chloride      Principal Problem:   Ascites Active Problems:   Type 2 diabetes mellitus with hemoglobin A1c goal of less than 7.0% (HCC)   Hypertension   Other pancytopenia (HCC)   Hepatic cirrhosis (HCC)   Portal hypertension (HCC)   Anasarca   Chronic anemia   Time spent:   Irwin Brakeman, MD, FAAFP [facility] Pager [phone]  If 7PM-7AM, please contact night-coverage [email or web address] Password [email or web address] 09/26/2017, 10:55 AM    LOS: 0 days

## 2017-09-26 NOTE — Plan of Care (Signed)
Nutrition Education Note  RD asked to educate patient on cirrhotic nutrition therapy, specifically regarding protein intake.   Went through brief diet recall. His breakfasts sound to already be high protein with eggs and sausage/bacon (he is aware these are high in sodium and watches portion size). He reports eating a large amount of peanut butter during day. He drinks a fairly large amount of buttermilk.   He says he struggles eating a lot at a time and has early satiety.   RD went through cirrhosis diet recommendations: Consume 6 small meals/day due to early satiety Include protein sources at each meal In addition to the 6 small meals, consume an oral protein supplement at nighttime before bed Avoid high sodium items and limit sodium to <2g/day Restrict fluids if indicated  Goal protein intake is ~ 1.2-1.5 g/kg bw. He currently has a large amount of ascites. He says he was 240 lbs ~ 1 year ago and lost down to 180. He says "I have regained some, but it is all fluid".  Per review of chart, most dry weight appears to be ~183 lbs (83.2 kg). Therefore, RD recommended 100-125g/protein per day. Goal written on handouts  RD reviewed all protein sources and specified which ones are going to be lower in sodium. Reviewed the importance of the nightly supplement and discussed possible options. Coupons were given. RD advocated for protein powder as an inexpensive way to meet his protein needs. He admits to drinking buttermilk regularly. He says he is aware this is high in sodium. He could add protein powder to this.   Handouts listing high protein foods and the grams of protein they contain were given to patient. Individualized recommendations handwritten on handouts.   Burtis Junes RD, LDN, CNSC Clinical Nutrition Pager: [phone] 09/26/2017 7:11 PM

## 2017-09-26 NOTE — Care Management Important Message (Signed)
Important Message  Patient Details  Name: Chris Alvarez MRN: [id number] Date of Birth: 1961/07/20   Medicare Important Message Given:  Yes    Sherald Barge, RN 09/26/2017, 12:25 PM

## 2017-09-26 NOTE — Progress Notes (Addendum)
Subjective: Feels much better today. Abdominal pain much improved, breathing well, legs less swollen and pain resolved. Discussed low salt diet and feel he is well versed at this time. Discussed low albumin and need for adequate protein intake. No other GI symptoms. No ETOH in about 3 months.  Objective: Vital signs in last 24 hours: Temp:  [97.6 F (36.4 C)-98.8 F (37.1 C)] 98.3 F (36.8 C) (03/01 0500) Pulse Rate:  [66-75] 71 (03/01 0500) Resp:  [16-20] 20 (03/01 0500) BP: (97-109)/(60-72) 109/72 (03/01 0500) SpO2:  [97 %-100 %] 97 % (03/01 0500) Weight:  [196 lb 4.8 oz (89 kg)] 196 lb 4.8 oz (89 kg) (03/01 0413) Last BM Date: 09/24/17 General:   Alert and oriented, pleasant Eyes:  No icterus, sclera clear. Conjuctiva pink.  Heart:  S1, S2 present, no murmurs noted.  Lungs: Clear to auscultation bilaterally, without wheezing, rales, or rhonchi.  Abdomen:  Bowel sounds present, distended but soft, non-tender. o rebound or guarding. No masses appreciated  Msk:  Symmetrical without gross deformities. Pulses:  Normal pulses noted. Extremities:  Noted bilateral 1+ pitting edema, no erythema. Neurologic:  Alert and  oriented x4;  grossly normal neurologically. Skin:  Warm and dry, intact without significant lesions.  Psych:  Alert and cooperative. Normal mood and affect.  Intake/Output from previous day: 02/28 0701 - 03/01 0700 In: 940 [P.O.:840; IV Piggyback:100] Out: 1300 [Urine:1300] Intake/Output this shift: No intake/output data recorded.  Lab Results: Recent Labs    09/24/17 1903 09/26/17 0449  WBC 2.4* 2.4*  HGB 9.9* 9.6*  HCT 30.3* 29.5*  PLT 51* 48*   BMET Recent Labs    09/24/17 1903 09/26/17 0449  NA 141 135  K 3.9 3.7  CL 108 100*  CO2 26 25  GLUCOSE 147* 177*  BUN 14 15  CREATININE 0.81 1.02  CALCIUM 8.9 8.4*   LFT Recent Labs    09/24/17 1903 09/26/17 0449  PROT 7.0 6.3*  ALBUMIN 3.2* 2.9*  AST 40 39  ALT 21 18  ALKPHOS 125 108   BILITOT 1.1 1.0   PT/INR Recent Labs    09/25/17 0545  LABPROT 18.4*  INR 1.55   Hepatitis Panel Recent Labs    09/25/17 1249  HEPBSAG Negative     Studies/Results: Dg Chest 2 View  Result Date: 09/24/2017 CLINICAL DATA:  Chronic cough. Shortness of breath. Smoker. Bilateral leg swelling for the past 2 months. Abdominal distention. EXAM: CHEST  2 VIEW COMPARISON:  07/09/2017. FINDINGS: Normal sized heart. Clear lungs. Mild diffuse peribronchial thickening and accentuation of the interstitial markings. Mild thoracic spine degenerative changes. IMPRESSION: No acute abnormality.  Stable mild chronic bronchitic changes. Electronically Signed   By: Claudie Revering M.D.   On: 09/24/2017 21:52   Ct Abdomen Pelvis W Contrast  Result Date: 09/24/2017 CLINICAL DATA:  Increased leg edema and abdominal swelling EXAM: CT ABDOMEN AND PELVIS WITH CONTRAST TECHNIQUE: Multidetector CT imaging of the abdomen and pelvis was performed using the standard protocol following bolus administration of intravenous contrast. CONTRAST:  151m ISOVUE-300 IOPAMIDOL (ISOVUE-300) INJECTION 61% COMPARISON:  Ultrasound 08/22/2017 FINDINGS: Lower chest: Lung bases demonstrate no acute consolidation or pleural effusion. The heart size is normal. Hepatobiliary: Coarse nodular liver consistent with cirrhosis. No focal hepatic abnormality. No calcified gallstones or biliary dilatation Pancreas: Unremarkable. No pancreatic ductal dilatation or surrounding inflammatory changes. Spleen: Enlarged at 20 cm craniocaudad.  Splenule anteriorly. Adrenals/Urinary Tract: Adrenal glands are unremarkable. Kidneys are normal, without renal calculi, focal lesion,  or hydronephrosis. Bladder is unremarkable. Stomach/Bowel: Stomach is nonenlarged. No dilated small bowel. No colon wall thickening. Scattered colon diverticula. Appendix not well seen Vascular/Lymphatic: Moderate aortic atherosclerosis. No aneurysmal dilatation. Patent portal vein.  Recanalized paraumbilical vein. Multiple upper abdominal varices. Small gastrohepatic and retroperitoneal lymph nodes. Reproductive: Prostate is unremarkable. Other: Negative for free air. Large volume of ascites in the abdomen and pelvis. Skin thickening and subcutaneous edema anteriorly with soft tissue thickening at the umbilicus. Musculoskeletal: Degenerative changes. No acute or suspicious lesion IMPRESSION: 1. Cirrhosis of the liver with portal hypertension. Large volume of ascites within the abdomen and pelvis. 2. Mild retroperitoneal and upper abdominal adenopathy. 3. No evidence for a bowel obstruction. Electronically Signed   By: Donavan Foil M.D.   On: 09/24/2017 21:52   US Paracentesis  Result Date: 09/25/2017 INDICATION: Recurrent ascites EXAM: ULTRASOUND-GUIDED PARACENTESIS COMPARISON:  Previous paracentesis. MEDICATIONS: 10 cc 1% lidocaine. COMPLICATIONS: None immediate TECHNIQUE: Informed written consent was obtained from the patient after a discussion of the risks, benefits and alternatives to treatment. A timeout was performed prior to the initiation of the procedure. Initial ultrasound scanning demonstrates a large amount of ascites within the right lower abdominal quadrant. The right lower abdomen was prepped and draped in the usual sterile fashion. 1% lidocaine with epinephrine was used for local anesthesia. Under direct ultrasound guidance, a 19 gauge, 7-cm, Yueh catheter was introduced. An ultrasound image was saved for documentation purposed. the paracentesis was performed. The catheter was removed and a dressing was applied. The patient tolerated the procedure well without immediate post procedural complication. FINDINGS: A total of approximately 4.8 liters of yellow fluid was removed. Samples were sent to the laboratory as requested by the clinical team. IMPRESSION: Successful ultrasound-guided paracentesis yielding 4.8 liters of peritoneal fluid. Read by Lavonia Drafts [facility]  Electronically Signed   By: Lavonia Dana M.D.   On: 09/25/2017 12:46    Assessment: 57 year old gentleman with history of decompensated cirrhosis, likely alcohol related but workup incomplete. Presents with refractory edema, worsening lower extremity edema associate with pain. Started on low dose Lasix/Aldactone as an outpatient back in January with minimal improvement. Agree with current diuretic regimen. Current MELD 12.  He has Right greater than Left lower extremity edema.   Did have similar symptoms in January 2018 with an ultrasound venous study finding no evidence of DVT on the right.  He states when he is retaining fluid his right typically swells worse than his left.  LVAP completed yesterday with 4.8L removed. Fluid analysis normal. Stain negative, culture pending.  Labs today with Normal LFTs, Tbili, Alk Phos. Low albumin at 2.9, normal Cr.  CBC with some anemia with a hemoglobin 0.6 that appears at baseline.  Platelets remain depressed at 34 consistent with cirrhosis and portal hypertension.  Ammonia is elevated at 176 today.  He has diuresed well yesterday and since admission he is -1360 mL.  Clinically he is significantly improved today.  He is breathing better, abdomen is much softer, legs much smaller.  Still with some mild pitting edema bilaterally.  Discussed low albumin levels and need for adequate protein intake.  Current recommendations for cirrhotics are 1.2-1.5 g of protein per kilogram body weight.  This would be about 100 g of protein daily.  Function currently doing well creatinine of 1.02 this morning.  Recommend he continue to abstain from alcohol as should he need a liver transplant evaluation he will need cessation of alcohol for minimum of 6 months for consideration of  transplant.  He verbalized understanding.  Plan: 1. Hepatitis A IgM 2. Recheck CMP tomorrow 3. Continue diuresis 4. Supportive measures 5. Dietitian consult for protein intake  guidance/education   Thank you for allowing [place] to participate in the care of Hardie Shackleton, DNP, AGNP-C Adult & Gerontological Nurse Practitioner [facility]    LOS: 0 days    09/26/2017, 8:18 AM

## 2017-09-26 NOTE — OR Nursing (Signed)
Patient is currently and in pateint and scheduled for procedure with Dr Gala Romney on Monday, 4 March. Pre op instructions printed and taken to floor in case patient is discharged before Monday. Spoke with Helene Kelp, RN about instructions to be included in discharge paperwork if he goes home. She verbalizes understanding.

## 2017-09-27 DIAGNOSIS — R188 Other ascites: Principal | ICD-10-CM

## 2017-09-27 DIAGNOSIS — I10 Essential (primary) hypertension: Secondary | ICD-10-CM

## 2017-09-27 DIAGNOSIS — D649 Anemia, unspecified: Secondary | ICD-10-CM

## 2017-09-27 DIAGNOSIS — E119 Type 2 diabetes mellitus without complications: Secondary | ICD-10-CM

## 2017-09-27 DIAGNOSIS — D61818 Other pancytopenia: Secondary | ICD-10-CM

## 2017-09-27 DIAGNOSIS — R601 Generalized edema: Secondary | ICD-10-CM

## 2017-09-27 LAB — GLUCOSE, CAPILLARY
GLUCOSE-CAPILLARY: 179 mg/dL — AB (ref 65–99)
GLUCOSE-CAPILLARY: 121 mg/dL — AB (ref 65–99)

## 2017-09-27 LAB — HEPATITIS A ANTIBODY, IGM: Hep A IgM: NEGATIVE

## 2017-09-27 MED ORDER — INSULIN ASPART 100 UNIT/ML ~~LOC~~ SOLN
0.0000 [IU] | Freq: Three times a day (TID) | SUBCUTANEOUS | Status: DC
  Administered 2017-09-27: 2 [IU] via SUBCUTANEOUS
  Administered 2017-09-28: 3 [IU] via SUBCUTANEOUS

## 2017-09-27 MED ORDER — INSULIN ASPART 100 UNIT/ML ~~LOC~~ SOLN: 0.0000 [IU] | Freq: Every day | SUBCUTANEOUS | Status: DC

## 2017-09-27 MED ORDER — RIFAXIMIN 550 MG PO TABS
550.0000 mg | ORAL_TABLET | Freq: Two times a day (BID) | ORAL | Status: DC
  Administered 2017-09-27 – 2017-09-28 (×2): 550 mg via ORAL
  Filled 2017-09-27 (×2): qty 1

## 2017-09-27 MED ORDER — LACTULOSE 10 GM/15ML PO SOLN
20.0000 g | Freq: Three times a day (TID) | ORAL | Status: DC
  Administered 2017-09-27 – 2017-09-28 (×4): 20 g via ORAL
  Filled 2017-09-27 (×4): qty 30

## 2017-09-27 NOTE — Progress Notes (Signed)
Patient ID: Chris Alvarez, male   DOB: [birth year], 57 y.o.   MRN: [id number]   Assessment/Plan: ADMITTED WITH ANASARCA. CLINICALLY IMPROVED AFTER LASIX IV/ALDACTONE AND PARACENTESIS.  PLAN: 1. CONTINUE ALDACTONE/LASIX. 2. AGREE WITH LACTULOSE. ADD XIFAXAN BID. 3. ANTICIPATE DISCHARGE IN 24 HRS.    Subjective: Since I last evaluated the patient HE FEELS BETTER BUT HAS AN ELEVATED AMMONIA.LACTULOSE ADDED.  Objective: Vital signs in last 24 hours: Vitals:   09/26/17 2044 09/27/17 0419  BP: 107/61 97/60  Pulse: 71 78  Resp: 18 17  Temp: 98.4 F (36.9 C) 98 F (36.7 C)  SpO2: 100% 95%   General appearance: alert, cooperative and no distress Resp: clear to auscultation bilaterally Cardio: regular rate and rhythm GI: soft, non-tender; MILDLY DISTENDED, bowel sounds normal; EXTREMITIES: 1-2+ EDEMA BILATERALLY LOWER  Lab Results: MAR 1 AMMONIA 176 Cr 1.02 Hb 8.6 PLT CT 46     Studies/Results: NONE   Medications: I have reviewed the patient's current medications.   LOS: 5 days   Barney Drain 01/06/2014, 2:23 PM

## 2017-09-27 NOTE — Progress Notes (Signed)
PROGRESS NOTE    Chris Alvarez  WEX:[id number]  DOB: 1960-11-26  DOA: 09/24/2017 PCP: Celene Squibb, MD   Brief Admission Hx: Chris Alvarez is a 57 y.o. male with medical history significant for type 2 diabetes, dyslipidemia, hypertension, and hepatic cirrhosis with portal hypertension and recurrent ascites who presented to the ED with significant increase in abdominal distention over the last 3-4 days despite a paracentesis that was just performed a few days ago.  He states that he routinely requires paracentesis and sees Dr. Gala Romney for this problem, but now states that despite continuing his home diuretic medication, fluid restriction, and watching his salt intake, he is noticing a faster recurrence.  MDM/Assessment & Plan:  1. Large ascites-refractory in nature.  This is secondary to hepatic cirrhosis with noted portal hypertension.  Paracentesis performed on 2/28 with 4.5 L removed.  No evidence of underlying SBP based on fluid analysis.  Patient is feeling better.  Continue on IV Lasix and Aldactone. 2. Liver cirrhosis from chronic alcoholism and recreational drug use - Hep A antibody positive. GI following.    3. Type 2 diabetes.  Will order sliding scale insulin.  Follow blood sugars. 4. Hyperammonemia -continue on lactulose, started on Xifaxan.  Repeat labs in a.m. 5. Hypertension.  Maintain on home medications. 6. Pancytopenia.    Likely related to underlying liver disease.  Continue to monitor and maintain on SCDs for now.  DVT prophylaxis: SCDs Code Status: Full Family Communication: None Disposition Plan: Discharge home once improved, possibly in a.m. Consults called:GI Admission status: INP  Consultants:  GI team  Procedures:  Paracentesis 2/28 <fluid studies pending>  Subjective: Feels as though he is continuing to slowly improve.  No shortness of breath.  Abdomen does not feel as distended as it previously was.  Lower extremity edema  improving  Objective: Vitals:   09/26/17 1700 09/26/17 2044 09/27/17 0419 09/27/17 1300  BP:  107/61 97/60 102/63  Pulse:  71 78 76  Resp:  18 17 18   Temp:  98.4 F (36.9 C) 98 F (36.7 C) 98.2 F (36.8 C)  TempSrc:  Oral Oral Oral  SpO2:  100% 95% 98%  Weight: 90.8 kg (200 lb 2.8 oz)  88.1 kg (194 lb 3.2 oz)   Height:        Intake/Output Summary (Last 24 hours) at 09/27/2017 1728 Last data filed at 09/27/2017 1200 Gross per 24 hour  Intake 240 ml  Output 2250 ml  Net -2010 ml   Filed Weights   09/26/17 0413 09/26/17 1700 09/27/17 0419  Weight: 89 kg (196 lb 4.8 oz) 90.8 kg (200 lb 2.8 oz) 88.1 kg (194 lb 3.2 oz)   REVIEW OF SYSTEMS  As per history otherwise all reviewed and reported negative  Exam:  General exam: Alert, awake, oriented x 3 Respiratory system: Clear to auscultation. Respiratory effort normal. Cardiovascular system:RRR. No murmurs, rubs, gallops. Gastrointestinal system: Abdomen is distended, soft and nontender. No organomegaly or masses felt. Normal bowel sounds heard. Central nervous system: Alert and oriented. No focal neurological deficits.  Mild asterixis Extremities: 1+ lower extremity edema bilaterally Skin: No rashes, lesions or ulcers Psychiatry: Judgement and insight appear normal. Mood & affect appropriate.   Data Reviewed: Basic Metabolic Panel: Recent Labs  Lab 09/24/17 1903 09/26/17 0449  NA 141 135  K 3.9 3.7  CL 108 100*  CO2 26 25  GLUCOSE 147* 177*  BUN 14 15  CREATININE 0.81 1.02  CALCIUM 8.9 8.4*  Liver Function Tests: Recent Labs  Lab 09/24/17 1903 09/26/17 0449  AST 40 39  ALT 21 18  ALKPHOS 125 108  BILITOT 1.1 1.0  PROT 7.0 6.3*  ALBUMIN 3.2* 2.9*   Recent Labs  Lab 09/24/17 1903  LIPASE 22   Recent Labs  Lab 09/26/17 0449  AMMONIA 176*   CBC: Recent Labs  Lab 09/24/17 1903 09/26/17 0449  WBC 2.4* 2.4*  NEUTROABS 1.5*  --   HGB 9.9* 9.6*  HCT 30.3* 29.5*  MCV 91.3 91.0  PLT 51* 48*    Cardiac Enzymes: Recent Labs  Lab 09/24/17 1903  TROPONINI <0.03   CBG (last 3)  Recent Labs    09/25/17 0332  GLUCAP 145*   Recent Results (from the past 240 hour(s))  Culture, body fluid-bottle     Status: None (Preliminary result)   Collection Time: 09/25/17 11:51 AM  Result Value Ref Range Status   Specimen Description ASCITIC  Final   Special Requests BOTTLES DRAWN AEROBIC AND ANAEROBIC 10CC  Final   Culture   Final    NO GROWTH 2 DAYS Performed at [facility], [address] [place], [place] [address]    Report Status PENDING  Incomplete  Gram stain     Status: None   Collection Time: 09/25/17 11:51 AM  Result Value Ref Range Status   Specimen Description ASCITIC  Final   Special Requests NONE  Final   Gram Stain   Final    NO ORGANISMS SEEN WBC PRESENT,BOTH PMN AND MONONUCLEAR CYTOSPIN SMEAR Performed at [facility], [address], [place], [place] [address]    Report Status 09/25/2017 FINAL  Final     Studies: No results found. Scheduled Meds: . fenofibrate  160 mg Oral Daily  . furosemide  80 mg Intravenous BID AC  . lactulose  20 g Oral TID  . oxyCODONE  10 mg Oral TID  . pantoprazole  40 mg Oral Daily  . potassium chloride  10 mEq Oral Daily  . rifaximin  550 mg Oral BID  . rosuvastatin  40 mg Oral QODAY  . sodium chloride flush  3 mL Intravenous Q12H  . spironolactone  50 mg Oral BID AC   Continuous Infusions: . sodium chloride      Principal Problem:   Ascites Active Problems:   Type 2 diabetes mellitus with hemoglobin A1c goal of less than 7.0% (HCC)   Hypertension   Other pancytopenia (HCC)   Hepatic cirrhosis (HCC)   Portal hypertension (HCC)   Anasarca   Chronic anemia   Volume overload   Time spent: 79mins   Kathie Dike, MD [facility] Pager [phone]  If 7PM-7AM, please contact night-coverage [email or web address] Password [email or web address] 09/27/2017, 5:28 PM    LOS: 1 day

## 2017-09-27 NOTE — Progress Notes (Signed)
Pt resting watching TV.

## 2017-09-27 NOTE — Plan of Care (Signed)
progressing 

## 2017-09-28 ENCOUNTER — Telehealth: Payer: Self-pay | Admitting: Gastroenterology

## 2017-09-28 DIAGNOSIS — K219 Gastro-esophageal reflux disease without esophagitis: Secondary | ICD-10-CM

## 2017-09-28 DIAGNOSIS — K7011 Alcoholic hepatitis with ascites: Secondary | ICD-10-CM

## 2017-09-28 LAB — GLUCOSE, CAPILLARY
GLUCOSE-CAPILLARY: 204 mg/dL — AB (ref 65–99)
Glucose-Capillary: 107 mg/dL — ABNORMAL HIGH (ref 65–99)

## 2017-09-28 LAB — BASIC METABOLIC PANEL
Anion gap: 8 (ref 5–15)
BUN: 17 mg/dL (ref 6–20)
CALCIUM: 8.8 mg/dL — AB (ref 8.9–10.3)
CO2: 26 mmol/L (ref 22–32)
CREATININE: 0.82 mg/dL (ref 0.61–1.24)
Chloride: 103 mmol/L (ref 101–111)
GFR calc Af Amer: >60 mL/min (ref >60)
GLUCOSE: 124 mg/dL — AB (ref 65–99)
Potassium: 3.6 mmol/L (ref 3.5–5.1)
Sodium: 137 mmol/L (ref 135–145)

## 2017-09-28 LAB — MAGNESIUM: Magnesium: 1.5 mg/dL — ABNORMAL LOW (ref 1.7–2.4)

## 2017-09-28 LAB — AMMONIA: Ammonia: 90 umol/L — ABNORMAL HIGH (ref 9–35)

## 2017-09-28 MED ORDER — RIFAXIMIN 550 MG PO TABS: 550.0000 mg | ORAL_TABLET | Freq: Two times a day (BID) | ORAL | 0 refills | Status: DC

## 2017-09-28 MED ORDER — FUROSEMIDE 40 MG PO TABS: 40.0000 mg | ORAL_TABLET | Freq: Two times a day (BID) | ORAL | 1 refills | Status: AC

## 2017-09-28 MED ORDER — MAGNESIUM SULFATE 2 GM/50ML IV SOLN
2.0000 g | Freq: Once | INTRAVENOUS | Status: AC
  Administered 2017-09-28: 2 g via INTRAVENOUS
  Filled 2017-09-28: qty 50

## 2017-09-28 MED ORDER — LACTULOSE 10 GM/15ML PO SOLN: 20.0000 g | Freq: Two times a day (BID) | ORAL | 0 refills | Status: AC

## 2017-09-28 MED ORDER — SPIRONOLACTONE 50 MG PO TABS
50.0000 mg | ORAL_TABLET | Freq: Two times a day (BID) | ORAL | 3 refills | Status: DC
Start: 2017-09-28 — End: 2018-04-13

## 2017-09-28 NOTE — Progress Notes (Signed)
Discharge instructions give by Santa Lighter RN. Pt and his son verbalized understanding of all instructions. Discharged to home with son

## 2017-09-28 NOTE — Discharge Summary (Signed)
Physician Discharge Summary  Chris Alvarez DVV:[id number] DOB: 15-Mar-1961 DOA: 09/24/2017  PCP: Celene Squibb, MD  Admit date: 09/24/2017 Discharge date: 09/28/2017  Admitted From: Home Disposition: Home  Recommendations for Outpatient Follow-up:  1. Follow up with PCP in 1-2 weeks 2. Please obtain BMP/CBC in one week 3. Follow-up with gastroenterology as an outpatient  Home Health: Equipment/Devices:  Discharge Condition: Stable CODE STATUS: Full code Diet recommendation: Heart Healthy / Carb Modified   Brief/Interim Summary: 57 year old male with a history of diabetes, hypertension, cirrhosis with recurrent ascites, presented to the hospital with abdominal distention and generalized edema.  He was admitted to the hospital for intravenous diuresis and underwent paracentesis on 2/28 with removal of 4.5 L of fluid.  Fluid analysis did not indicate any evidence of SBP.  Patient was continued on aggressive diuresis and his weight decreased from 214 pounds on admission to 187 pounds on discharge.  He has been transitioned to oral Lasix and Aldactone.  Doses have been adjusted.  He will need repeat chemistries in 2 weeks.  He was also noted to have elevated ammonia which improved with lactulose and Xifaxan.  These will be continued on discharge.  He does not have any evidence of encephalopathy at this time.  Patient appears stable for discharge home today.  Discharge Diagnoses:  Principal Problem:   Ascites Active Problems:   Type 2 diabetes mellitus with hemoglobin A1c goal of less than 7.0% (HCC)   Hypertension   Other pancytopenia (HCC)   Hepatic cirrhosis (HCC)   Portal hypertension (HCC)   Anasarca   Chronic anemia   Volume overload    Discharge Instructions  Discharge Instructions    Diet - low sodium heart healthy   Complete by:  As directed    Increase activity slowly   Complete by:  As directed      Allergies as of 09/28/2017      Reactions   Hydrocodone Bitartrate  Er Hives      Medication List    TAKE these medications   ALPRAZolam 1 MG tablet Commonly known as:  XANAX Take 1 mg by mouth daily as needed for anxiety.   fenofibrate 160 MG tablet Take 160 mg by mouth daily.   furosemide 40 MG tablet Commonly known as:  LASIX Take 1 tablet (40 mg total) by mouth 2 (two) times daily. What changed:    medication strength  how much to take  when to take this   lactulose 10 GM/15ML solution Commonly known as:  CHRONULAC Take 30 mLs (20 g total) by mouth 2 (two) times daily.   metFORMIN 500 MG tablet Commonly known as:  GLUCOPHAGE Take 500 mg by mouth daily with breakfast.   omeprazole 20 MG capsule Commonly known as:  PRILOSEC Take 20 mg by mouth daily.   oxyCODONE 5 MG immediate release tablet Commonly known as:  Oxy IR/ROXICODONE Take 10 mg by mouth 3 (three) times daily.   potassium chloride 10 MEQ tablet Commonly known as:  K-DUR,KLOR-CON Take 10 mEq by mouth daily.   PROAIR HFA 108 (90 Base) MCG/ACT inhaler Generic drug:  albuterol Inhale 2 puffs into the lungs every 4 (four) hours as needed for wheezing or shortness of breath. 1-2 puffs every 4-6 hours prn   rifaximin 550 MG Tabs tablet Commonly known as:  XIFAXAN Take 1 tablet (550 mg total) by mouth 2 (two) times daily.   rosuvastatin 40 MG tablet Commonly known as:  CRESTOR Take 40 mg by mouth  every other day.   spironolactone 50 MG tablet Commonly known as:  ALDACTONE Take 1 tablet (50 mg total) by mouth 2 (two) times daily. What changed:  when to take this      Follow-up Information    Rourk, Cristopher Estimable, MD Follow up.   Specialty:  Gastroenterology Why:  they will call you with appointment Contact information: [address] [place] [address] [phone]          Allergies  Allergen Reactions  . Hydrocodone Bitartrate Er Hives    Consultations:  Gastroenterology   Procedures/Studies: Dg Chest 2 View  Result Date:  09/24/2017 CLINICAL DATA:  Chronic cough. Shortness of breath. Smoker. Bilateral leg swelling for the past 2 months. Abdominal distention. EXAM: CHEST  2 VIEW COMPARISON:  07/09/2017. FINDINGS: Normal sized heart. Clear lungs. Mild diffuse peribronchial thickening and accentuation of the interstitial markings. Mild thoracic spine degenerative changes. IMPRESSION: No acute abnormality.  Stable mild chronic bronchitic changes. Electronically Signed   By: Claudie Revering M.D.   On: 09/24/2017 21:52   Ct Abdomen Pelvis W Contrast  Result Date: 09/24/2017 CLINICAL DATA:  Increased leg edema and abdominal swelling EXAM: CT ABDOMEN AND PELVIS WITH CONTRAST TECHNIQUE: Multidetector CT imaging of the abdomen and pelvis was performed using the standard protocol following bolus administration of intravenous contrast. CONTRAST:  174mL ISOVUE-300 IOPAMIDOL (ISOVUE-300) INJECTION 61% COMPARISON:  Ultrasound 08/22/2017 FINDINGS: Lower chest: Lung bases demonstrate no acute consolidation or pleural effusion. The heart size is normal. Hepatobiliary: Coarse nodular liver consistent with cirrhosis. No focal hepatic abnormality. No calcified gallstones or biliary dilatation Pancreas: Unremarkable. No pancreatic ductal dilatation or surrounding inflammatory changes. Spleen: Enlarged at 20 cm craniocaudad.  Splenule anteriorly. Adrenals/Urinary Tract: Adrenal glands are unremarkable. Kidneys are normal, without renal calculi, focal lesion, or hydronephrosis. Bladder is unremarkable. Stomach/Bowel: Stomach is nonenlarged. No dilated small bowel. No colon wall thickening. Scattered colon diverticula. Appendix not well seen Vascular/Lymphatic: Moderate aortic atherosclerosis. No aneurysmal dilatation. Patent portal vein. Recanalized paraumbilical vein. Multiple upper abdominal varices. Small gastrohepatic and retroperitoneal lymph nodes. Reproductive: Prostate is unremarkable. Other: Negative for free air. Large volume of ascites in the  abdomen and pelvis. Skin thickening and subcutaneous edema anteriorly with soft tissue thickening at the umbilicus. Musculoskeletal: Degenerative changes. No acute or suspicious lesion IMPRESSION: 1. Cirrhosis of the liver with portal hypertension. Large volume of ascites within the abdomen and pelvis. 2. Mild retroperitoneal and upper abdominal adenopathy. 3. No evidence for a bowel obstruction. Electronically Signed   By: Donavan Foil M.D.   On: 09/24/2017 21:52   US Paracentesis  Result Date: 09/25/2017 INDICATION: Recurrent ascites EXAM: ULTRASOUND-GUIDED PARACENTESIS COMPARISON:  Previous paracentesis. MEDICATIONS: 10 cc 1% lidocaine. COMPLICATIONS: None immediate TECHNIQUE: Informed written consent was obtained from the patient after a discussion of the risks, benefits and alternatives to treatment. A timeout was performed prior to the initiation of the procedure. Initial ultrasound scanning demonstrates a large amount of ascites within the right lower abdominal quadrant. The right lower abdomen was prepped and draped in the usual sterile fashion. 1% lidocaine with epinephrine was used for local anesthesia. Under direct ultrasound guidance, a 19 gauge, 7-cm, Yueh catheter was introduced. An ultrasound image was saved for documentation purposed. the paracentesis was performed. The catheter was removed and a dressing was applied. The patient tolerated the procedure well without immediate post procedural complication. FINDINGS: A total of approximately 4.8 liters of yellow fluid was removed. Samples were sent to the laboratory as requested by the  clinical team. IMPRESSION: Successful ultrasound-guided paracentesis yielding 4.8 liters of peritoneal fluid. Read by Lavonia Drafts [facility] Electronically Signed   By: Lavonia Dana M.D.   On: 09/25/2017 12:46   US Paracentesis  Result Date: 09/17/2017 INDICATION: Recurrent ascites EXAM: ULTRASOUND-GUIDED PARACENTESIS COMPARISON:  Previous paracentesis. MEDICATIONS:  10 cc 1% lidocaine. COMPLICATIONS: None. TECHNIQUE: Informed written consent was obtained from the patient after a discussion of the risks, benefits and alternatives to treatment. A timeout was performed prior to the initiation of the procedure. Initial ultrasound scanning demonstrates a large amount of ascites within the right lower abdominal quadrant. The right lower abdomen was prepped and draped in the usual sterile fashion. 1% lidocaine with epinephrine was used for local anesthesia. Under direct ultrasound guidance, a 19 gauge, 7-cm, Yueh catheter was introduced. An ultrasound image was saved for documentation purposed. The paracentesis was performed. The catheter was removed and a dressing was applied. The patient tolerated the procedure well without immediate post procedural complication. FINDINGS: A total of approximately 4.85  liters of yellow fluid was removed. IMPRESSION: Successful ultrasound-guided paracentesis yielding 4.85 liters of peritoneal fluid. Read by Lavonia Drafts [facility] Electronically Signed   By: Lorriane Shire M.D.   On: 09/17/2017 09:43    Paracentesis 2/28 with removal of 4.8 L of fluid   Subjective: Feeling significantly better.  No shortness of breath.  Overall edema is better.  Wants to go home.  Discharge Exam: Vitals:   09/27/17 2223 09/28/17 0601  BP: (!) 100/59 99/65  Pulse: 77 72  Resp: 18 16  Temp: 98.3 F (36.8 C) 98.4 F (36.9 C)  SpO2: 96% 98%   Vitals:   09/27/17 0419 09/27/17 1300 09/27/17 2223 09/28/17 0601  BP: 97/60 102/63 (!) 100/59 99/65  Pulse: 78 76 77 72  Resp: 17 18 18 16   Temp: 98 F (36.7 C) 98.2 F (36.8 C) 98.3 F (36.8 C) 98.4 F (36.9 C)  TempSrc: Oral Oral Oral Oral  SpO2: 95% 98% 96% 98%  Weight: 88.1 kg (194 lb 3.2 oz)   85.2 kg (187 lb 14.4 oz)  Height:        General: Pt is alert, awake, not in acute distress Cardiovascular: RRR, S1/S2 +, no rubs, no gallops Respiratory: CTA bilaterally, no wheezing, no  rhonchi Abdominal: Soft, NT, ND, bowel sounds + Extremities: 1+ edema, no cyanosis    The results of significant diagnostics from this hospitalization (including imaging, microbiology, ancillary and laboratory) are listed below for reference.     Microbiology: Recent Results (from the past 240 hour(s))  Culture, body fluid-bottle     Status: None (Preliminary result)   Collection Time: 09/25/17 11:51 AM  Result Value Ref Range Status   Specimen Description ASCITIC  Final   Special Requests BOTTLES DRAWN AEROBIC AND ANAEROBIC 10CC  Final   Culture   Final    NO GROWTH 3 DAYS Performed at [facility], [address], [place], [place] [address]    Report Status PENDING  Incomplete  Gram stain     Status: None   Collection Time: 09/25/17 11:51 AM  Result Value Ref Range Status   Specimen Description ASCITIC  Final   Special Requests NONE  Final   Gram Stain   Final    NO ORGANISMS SEEN WBC PRESENT,BOTH PMN AND MONONUCLEAR CYTOSPIN SMEAR Performed at [facility], [address], [place], [place] [address]    Report Status 09/25/2017 FINAL  Final     Labs: BNP (last  3 results) Recent Labs    07/08/17 1413 07/09/17 1238 09/24/17 1903  BNP 98.0 68.0 81.1   Basic Metabolic Panel: Recent Labs  Lab 09/24/17 1903 09/26/17 0449 09/28/17 0631  NA 141 135 137  K 3.9 3.7 3.6  CL 108 100* 103  CO2 26 25 26   GLUCOSE 147* 177* 124*  BUN 14 15 17   CREATININE 0.81 1.02 0.82  CALCIUM 8.9 8.4* 8.8*  MG  --   --  1.5*   Liver Function Tests: Recent Labs  Lab 09/24/17 1903 09/26/17 0449  AST 40 39  ALT 21 18  ALKPHOS 125 108  BILITOT 1.1 1.0  PROT 7.0 6.3*  ALBUMIN 3.2* 2.9*   Recent Labs  Lab 09/24/17 1903  LIPASE 22   Recent Labs  Lab 09/26/17 0449 09/28/17 0631  AMMONIA 176* 90*   CBC: Recent Labs  Lab 09/24/17 1903 09/26/17 0449  WBC 2.4* 2.4*  NEUTROABS 1.5*  --   HGB 9.9* 9.6*  HCT 30.3* 29.5*  MCV 91.3 91.0  PLT 51* 48*   Cardiac  Enzymes: Recent Labs  Lab 09/24/17 1903  TROPONINI <0.03   BNP: Invalid input(s): POCBNP CBG: Recent Labs  Lab 09/25/17 0332 09/27/17 1800 09/27/17 2227 09/28/17 0751 09/28/17 1224  GLUCAP 145* 179* 121* 107* 204*   D-Dimer No results for input(s): DDIMER in the last 72 hours. Hgb A1c No results for input(s): HGBA1C in the last 72 hours. Lipid Profile No results for input(s): CHOL, HDL, LDLCALC, TRIG, CHOLHDL, LDLDIRECT in the last 72 hours. Thyroid function studies No results for input(s): TSH, T4TOTAL, T3FREE, THYROIDAB in the last 72 hours.  Invalid input(s): FREET3 Anemia work up No results for input(s): VITAMINB12, FOLATE, FERRITIN, TIBC, IRON, RETICCTPCT in the last 72 hours. Urinalysis    Component Value Date/Time   COLORURINE YELLOW 07/09/2017 1230   APPEARANCEUR CLEAR 07/09/2017 1230   LABSPEC 1.004 (L) 07/09/2017 1230   PHURINE 6.0 07/09/2017 1230   GLUCOSEU NEGATIVE 07/09/2017 1230   HGBUR NEGATIVE 07/09/2017 1230   BILIRUBINUR NEGATIVE 07/09/2017 1230   KETONESUR NEGATIVE 07/09/2017 1230   PROTEINUR NEGATIVE 07/09/2017 1230   NITRITE NEGATIVE 07/09/2017 1230   LEUKOCYTESUR NEGATIVE 07/09/2017 1230   Sepsis Labs Invalid input(s): PROCALCITONIN,  WBC,  LACTICIDVEN Microbiology Recent Results (from the past 240 hour(s))  Culture, body fluid-bottle     Status: None (Preliminary result)   Collection Time: 09/25/17 11:51 AM  Result Value Ref Range Status   Specimen Description ASCITIC  Final   Special Requests BOTTLES DRAWN AEROBIC AND ANAEROBIC 10CC  Final   Culture   Final    NO GROWTH 3 DAYS Performed at [facility], [address] [place], [place] [address]    Report Status PENDING  Incomplete  Gram stain     Status: None   Collection Time: 09/25/17 11:51 AM  Result Value Ref Range Status   Specimen Description ASCITIC  Final   Special Requests NONE  Final   Gram Stain   Final    NO ORGANISMS SEEN WBC PRESENT,BOTH PMN AND  MONONUCLEAR CYTOSPIN SMEAR Performed at [facility], [address], [place], [place] [address]    Report Status 09/25/2017 FINAL  Final     Time coordinating discharge: Over 30 minutes  SIGNED:   Kathie Dike, MD  [facility] 09/28/2017, 6:18 PM Pager   If 7PM-7AM, please contact night-coverage [email or web address] Password [email or web address]

## 2017-09-28 NOTE — Progress Notes (Signed)
Patient ID: Chris Alvarez, male   DOB: [birth year], 57 y.o.   MRN: [id number]   Assessment/Plan: Admitted with anasarca. INITIAL WEIGHT: 214 LBS AN DNOW 187 LBS. CLINICALLY IMPROVED. TOLERATING LACTULOSE AND XIFAXAN.  PLAN:  1. D/C HOME ON LASIX 40 MG WITH ALDACTONE 50 MG BID. 2. RECHECK BMP IN 2 WEEKS. 3. CONTINUE XIFAXAN AND LACTULOSE. 4. OPV IN 6-8 WEEKS WITH DR. Gala Romney. NEEDS HEPATITIS B VACCINE AS AN OUTPT.   Subjective: Since I last evaluated the patient HIS WEIGHT HAS DECREASED AND LOWER EXTREMITY EDEMA HAS GOTTEN BETTER. No questions or concerns.   Objective: Vital signs in last 24 hours: Vitals:   09/27/17 2223 09/28/17 0601  BP: (!) 100/59 99/65  Pulse: 77 72  Resp: 18 16  Temp: 98.3 F (36.8 C) 98.4 F (36.9 C)  SpO2: 96% 98%   General appearance: alert, cooperative and no distress Resp: clear to auscultation bilaterally Cardio: regular rate and rhythm GI: soft, non-tender; bowel sounds normal;  Extremities:  atraumatic, edema 1+(RLE ONLY)  Lab Results:  K 3.6 Cr 0.82   Studies/Results: NONE   Medications: I have reviewed the patient's current medications.

## 2017-09-28 NOTE — Telephone Encounter (Signed)
PT DISCHARGED MAR 3.  1. RECHECK BMP IN 2 WEEKS. 2. OPV IN 6-8 WEEKS WITH DR. Gala Romney.  3. NEEDS HEPATITIS B VACCINE AS AN OUTPT.

## 2017-09-29 ENCOUNTER — Other Ambulatory Visit: Payer: Self-pay

## 2017-09-29 ENCOUNTER — Encounter (HOSPITAL_COMMUNITY): Payer: Self-pay | Admitting: Anesthesiology

## 2017-09-29 ENCOUNTER — Telehealth: Payer: Self-pay | Admitting: *Deleted

## 2017-09-29 ENCOUNTER — Ambulatory Visit (HOSPITAL_COMMUNITY): Admission: RE | Admit: 2017-09-29 | Payer: Medicare Other | Source: Ambulatory Visit | Admitting: Internal Medicine

## 2017-09-29 ENCOUNTER — Encounter (HOSPITAL_COMMUNITY): Admission: RE | Payer: Self-pay | Source: Ambulatory Visit

## 2017-09-29 DIAGNOSIS — K7030 Alcoholic cirrhosis of liver without ascites: Secondary | ICD-10-CM

## 2017-09-29 DIAGNOSIS — K7460 Unspecified cirrhosis of liver: Secondary | ICD-10-CM

## 2017-09-29 SURGERY — ESOPHAGOGASTRODUODENOSCOPY (EGD) WITH PROPOFOL
Anesthesia: Monitor Anesthesia Care

## 2017-09-29 MED ORDER — PROPOFOL 10 MG/ML IV BOLUS
INTRAVENOUS | Status: AC
  Filled 2017-09-29: qty 40

## 2017-09-29 NOTE — Telephone Encounter (Signed)
Pt notified. Orders and prescription mailed to pt.

## 2017-09-29 NOTE — Telephone Encounter (Signed)
OV made °

## 2017-09-29 NOTE — Telephone Encounter (Signed)
Hoyle Sauer called and said patient did not show up for procedure today. They called patient and he told them he was just d/c'd from the hospital. Per Hoyle Sauer, the pre-op nurse went over patient instructions before he was d/c'd. Patient had already ate for today as well. Forward to [place] as an [employer]

## 2017-09-30 ENCOUNTER — Encounter: Payer: Self-pay | Admitting: Internal Medicine

## 2017-09-30 LAB — CULTURE, BODY FLUID W GRAM STAIN -BOTTLE: Culture: NO GROWTH

## 2017-09-30 LAB — CULTURE, BODY FLUID-BOTTLE

## 2017-10-01 NOTE — Telephone Encounter (Signed)
Noted. Patient has pending appt 11/28/17 with RMR

## 2017-10-01 NOTE — Telephone Encounter (Signed)
Noted. Will need to reschedule on his post-hospital f/u

## 2017-10-03 ENCOUNTER — Other Ambulatory Visit (HOSPITAL_COMMUNITY): Payer: Medicare Other

## 2017-10-08 ENCOUNTER — Encounter: Payer: Self-pay | Admitting: Internal Medicine

## 2017-10-08 DIAGNOSIS — D696 Thrombocytopenia, unspecified: Secondary | ICD-10-CM | POA: Diagnosis not present

## 2017-10-08 DIAGNOSIS — M25511 Pain in right shoulder: Secondary | ICD-10-CM | POA: Diagnosis not present

## 2017-10-08 DIAGNOSIS — K7460 Unspecified cirrhosis of liver: Secondary | ICD-10-CM | POA: Diagnosis not present

## 2017-10-08 DIAGNOSIS — E785 Hyperlipidemia, unspecified: Secondary | ICD-10-CM | POA: Diagnosis not present

## 2017-10-08 DIAGNOSIS — G894 Chronic pain syndrome: Secondary | ICD-10-CM | POA: Diagnosis not present

## 2017-10-08 DIAGNOSIS — M25512 Pain in left shoulder: Secondary | ICD-10-CM | POA: Diagnosis not present

## 2017-10-08 DIAGNOSIS — M545 Low back pain: Secondary | ICD-10-CM | POA: Diagnosis not present

## 2017-10-08 DIAGNOSIS — E1165 Type 2 diabetes mellitus with hyperglycemia: Secondary | ICD-10-CM | POA: Diagnosis not present

## 2017-10-08 DIAGNOSIS — Z79891 Long term (current) use of opiate analgesic: Secondary | ICD-10-CM | POA: Diagnosis not present

## 2017-10-08 DIAGNOSIS — G8929 Other chronic pain: Secondary | ICD-10-CM | POA: Diagnosis not present

## 2017-10-10 ENCOUNTER — Ambulatory Visit (HOSPITAL_COMMUNITY): Payer: Medicare Other | Admitting: Adult Health

## 2017-10-13 ENCOUNTER — Telehealth: Payer: Self-pay

## 2017-10-13 NOTE — Telephone Encounter (Signed)
Recent labs from 10/08/17  Dr. Cecille Rubin office was placed in [facility] office for review.

## 2017-10-24 NOTE — Telephone Encounter (Signed)
Please tell the patient his hemoglobin (sign of blood loss) is improved compared to his last lab draw.  His other labs are stable.  Keep his appointment for follow-up routine care.  Call if any questions.

## 2017-10-27 NOTE — Telephone Encounter (Signed)
LMOM, pt notified of results.

## 2017-11-03 ENCOUNTER — Encounter: Payer: Self-pay | Admitting: Internal Medicine

## 2017-11-06 ENCOUNTER — Encounter: Payer: Self-pay | Admitting: *Deleted

## 2017-11-06 ENCOUNTER — Other Ambulatory Visit: Payer: Self-pay | Admitting: *Deleted

## 2017-11-06 NOTE — Patient Outreach (Signed)
[place] [facility]) Care Management  11/06/2017  Chris Alvarez 12-06-1960 [id number]  Telephone screening call   Referral received : 10/30/17 Referral reason : medication management , meal assistance  Referral source : PCP, [place] : Medicare, [place], Medicaid  Initial outreach call to patient unsuccessful , no answer and unable to leave a message.   Plan Will schedule 2nd call attempt in the next 3 to 4 business days per workflow. Unsuccessful outreach letter sent.    Joylene Draft, RN, [place] Management Coordinator  [phone]- Mobile [phone]- Toll Free Main Office

## 2017-11-07 DIAGNOSIS — M25511 Pain in right shoulder: Secondary | ICD-10-CM | POA: Diagnosis not present

## 2017-11-07 DIAGNOSIS — G8929 Other chronic pain: Secondary | ICD-10-CM | POA: Diagnosis not present

## 2017-11-07 DIAGNOSIS — Z79891 Long term (current) use of opiate analgesic: Secondary | ICD-10-CM | POA: Diagnosis not present

## 2017-11-07 DIAGNOSIS — G894 Chronic pain syndrome: Secondary | ICD-10-CM | POA: Diagnosis not present

## 2017-11-07 DIAGNOSIS — M545 Low back pain: Secondary | ICD-10-CM | POA: Diagnosis not present

## 2017-11-07 DIAGNOSIS — M542 Cervicalgia: Secondary | ICD-10-CM | POA: Diagnosis not present

## 2017-11-07 DIAGNOSIS — M25512 Pain in left shoulder: Secondary | ICD-10-CM | POA: Diagnosis not present

## 2017-11-10 ENCOUNTER — Other Ambulatory Visit: Payer: Self-pay | Admitting: *Deleted

## 2017-11-10 NOTE — Patient Outreach (Signed)
[place] [facility]) Care Management  11/10/2017  Chris Alvarez [birth year] [id number]  Telephone screening call attempt #2  Referral received : 10/30/17 Referral reason : medication management , meal assistance  Referral source : PCP, [place] : Medicare, [place], Medicaid    Unsuccessful call attempt #2 , no answer able to leave HIPAA compliant message for return call.    Plan Will schedule 3rd call attempt on day #7,   Joylene Draft, RN, [place] Management Coordinator  [phone]- Mobile [phone]- [place]

## 2017-11-12 ENCOUNTER — Ambulatory Visit: Payer: Medicare Other | Admitting: Nurse Practitioner

## 2017-11-14 ENCOUNTER — Other Ambulatory Visit: Payer: Self-pay | Admitting: *Deleted

## 2017-11-14 NOTE — Patient Outreach (Addendum)
[facility]) Care Management  11/14/2017  Chris Alvarez 1960/10/25 [id number]   Telephone outreach call attempt #3   Referral received : 10/30/17 Referral reason : medication management , meal assistance  Referral source : PCP, [place] : Medicare UHC, Medicaid  Placed call to patient explained reason for the call, patient states I meant to talk to doctor again about this referral.  Patient discussed his son helps take care of him, running errands making sure he takes his medication, cooks for him, patient asking if his son could get paid for providing these services.  Patient explained his son has been laid off for a while. Patient states son is not a Environmental education officer. Explained to patient unsure of program for that , but checking with local department of social services may be able to answer that question.   Patient declined moving forward with further assessment questions at this time as question of son being paid to be his caregiver was the main questions.  Patient request call later as his is getting ready for an appointment .   1230 Return call to patient , no answer able to leave HIPAA compliant message for return call.  Plan  Will return call in the next 3 business days.    Joylene Draft, RN, [place] Management Coordinator  [phone]- Mobile [phone]- Toll Free Main Office

## 2017-11-19 ENCOUNTER — Other Ambulatory Visit: Payer: Self-pay | Admitting: *Deleted

## 2017-11-19 ENCOUNTER — Telehealth: Payer: Self-pay | Admitting: *Deleted

## 2017-11-19 DIAGNOSIS — R188 Other ascites: Secondary | ICD-10-CM

## 2017-11-19 MED ORDER — ALBUMIN HUMAN 25 % IV SOLN: 12.5000 g | Freq: Once | INTRAVENOUS | Status: AC

## 2017-11-19 NOTE — Telephone Encounter (Signed)
Patient called in stating he is needing to have a paracentesis done have fluid drawn off. Please advise Chris Alvarez thanks

## 2017-11-19 NOTE — Telephone Encounter (Signed)
No, he does not have any standing orders

## 2017-11-19 NOTE — Telephone Encounter (Signed)
Ok to order an U/S paracentesis. Give IV Albumin 25 g after 4 L removed, 12.5 G for each additional 2 L removed (at 6L, 8L, 10L, etc).  Please ask the patient if he is having significant abdominal pain or fever. If not, then order labs: cell count, differential.  If yes, then do the above labs plus culture, gram stain, cytology.  Let me know if any questions!

## 2017-11-19 NOTE — Patient Outreach (Signed)
[facility]) Care Management  11/19/2017  Chris Alvarez May 08, 1961 [id number]    Follow up telephone screening call  Referral received : 10/30/17 Referral reason : medication management , meal assistance  Referral source : PCP, Dr.Z [place] Crane Insurance : Medicare UHC, Medicaid   Outreach call to patient to complete screening call , as patient was not able to at telephone visit on 4/19, successful call, HIPAA verified.  Patient discussed everything was under control and he does not need our  services at this time, it is working out with his son being able to help him, with transportation,, running errands and  making sure he takes his medication .  Offered patient telephone number to [place] as he was asking at previous conversation .   Patient again declined need for [facility] care management services as explained again , he politely stated I have your number if I anything comes up. Unable to complete full screening questions.   Plan  Will close case, mark as not active.    Joylene Draft, RN, [place] Management Coordinator  [phone]- Mobile [phone]- Toll Free Main Office

## 2017-11-19 NOTE — Telephone Encounter (Signed)
Do we have standing orders set-up for him?

## 2017-11-19 NOTE — Telephone Encounter (Signed)
Spoke with pt and he states he is not having any fever or abdominal pain.   Paracentesis scheduled for 11/20/17 at 10:00am, arrival time 9:45. Called made patient aware of appt details.

## 2017-11-20 ENCOUNTER — Ambulatory Visit (HOSPITAL_COMMUNITY)
Admission: RE | Admit: 2017-11-20 | Discharge: 2017-11-20 | Disposition: A | Discharge status: Home / Self Care | Payer: Medicare Other | Source: Ambulatory Visit | Attending: Nurse Practitioner | Admitting: Nurse Practitioner

## 2017-11-20 ENCOUNTER — Encounter (HOSPITAL_COMMUNITY): Payer: Self-pay

## 2017-11-20 DIAGNOSIS — R188 Other ascites: Secondary | ICD-10-CM | POA: Insufficient documentation

## 2017-11-20 DIAGNOSIS — K7460 Unspecified cirrhosis of liver: Secondary | ICD-10-CM | POA: Diagnosis not present

## 2017-11-20 LAB — BODY FLUID CELL COUNT WITH DIFFERENTIAL
Eos, Fluid: 0 %
LYMPHS FL: 33 %
MONOCYTE-MACROPHAGE-SEROUS FLUID: 46 % — AB (ref 50–90)
NEUTROPHIL FLUID: 21 % (ref 0–25)
Other Cells, Fluid: ABNORMAL %
Total Nucleated Cell Count, Fluid: 219 cu mm (ref 0–1000)

## 2017-11-20 NOTE — Progress Notes (Signed)
Paracentesis complete no signs of distress.  

## 2017-11-20 NOTE — Procedures (Signed)
PreOperative Dx: Cirrhosis, ascites Postoperative Dx: Cirrhosis, ascites Procedure:   US guided paracentesis Radiologist:  Ranard Harte Anesthesia:  10 ml of1% lidocaine Specimen:  6.7 L of yellow ascitic fluid EBL:   < 1 ml Complications: None  

## 2017-11-21 ENCOUNTER — Encounter: Payer: Self-pay | Admitting: *Deleted

## 2017-11-21 LAB — PATHOLOGIST SMEAR REVIEW

## 2017-11-28 ENCOUNTER — Ambulatory Visit: Payer: Medicare Other | Admitting: Internal Medicine

## 2017-12-02 ENCOUNTER — Other Ambulatory Visit: Payer: Self-pay | Admitting: *Deleted

## 2017-12-02 ENCOUNTER — Telehealth: Payer: Self-pay

## 2017-12-02 DIAGNOSIS — R188 Other ascites: Secondary | ICD-10-CM

## 2017-12-02 MED ORDER — ALBUMIN HUMAN 25 % IV SOLN: 25.0000 g | Freq: Once | INTRAVENOUS | Status: AC

## 2017-12-02 NOTE — Telephone Encounter (Signed)
Most recent Cr normal, hgb improved. Ok to schedule. Does he have standing orders?  If not: ok for para, 25 g Albumin after 4 L removed, additional 12.5 g Albumin every 2 L after initial 4L. Check cell count and differential.

## 2017-12-02 NOTE — Telephone Encounter (Signed)
PARA scheduled for 12/03/17 at 3:15pm, arrival time 3:00pm.  Called pt and spoke with pt son Vonna Kotyk. He will inform patient and also have him call me later to confirm he can go tomorrow.

## 2017-12-02 NOTE — Telephone Encounter (Signed)
Please advise Randall Hiss. LAst done was 11/20/17. Thanks

## 2017-12-02 NOTE — Telephone Encounter (Signed)
Pt called- he said he needed another para.  He can be reached at [phone].   He has an appointment next week with RMR and he is aware of date and time of appointment.

## 2017-12-02 NOTE — Telephone Encounter (Signed)
Erline Levine received a call from radiology and they are wanting him to there at [facility] tomorrow.   Called patient and his son stated he can't be there tomorrow at that time. He could possibly around 11am-12pm  Called ultrasound and advised them of the above. They asked if he would come in as soon as he could tomorrow.  Called made son Vonna Kotyk aware. He stated he would take him as soon as they are done with another appt.

## 2017-12-03 ENCOUNTER — Telehealth: Payer: Self-pay | Admitting: *Deleted

## 2017-12-03 ENCOUNTER — Ambulatory Visit (HOSPITAL_COMMUNITY): Admission: RE | Admit: 2017-12-03 | Payer: Medicare Other | Source: Ambulatory Visit

## 2017-12-03 NOTE — Telephone Encounter (Signed)
Patient called in stating his son's care broke down and was not able to make it to the PARA appt today. Requesting this appt to be r/s'd.   Called central scheduling and PARA r/s'd to tomorrow 12/04/17 at 9:00am. Arrive 8:30am.  Patient aware of appt details.

## 2017-12-04 ENCOUNTER — Ambulatory Visit (HOSPITAL_COMMUNITY)
Admission: RE | Admit: 2017-12-04 | Discharge: 2017-12-04 | Disposition: A | Discharge status: Home / Self Care | Payer: Medicare Other | Source: Ambulatory Visit | Attending: Nurse Practitioner | Admitting: Nurse Practitioner

## 2017-12-04 ENCOUNTER — Encounter (HOSPITAL_COMMUNITY): Payer: Self-pay

## 2017-12-04 DIAGNOSIS — K7031 Alcoholic cirrhosis of liver with ascites: Secondary | ICD-10-CM | POA: Diagnosis not present

## 2017-12-04 DIAGNOSIS — R188 Other ascites: Secondary | ICD-10-CM | POA: Diagnosis present

## 2017-12-04 LAB — CBC WITH DIFFERENTIAL/PLATELET
BASOS PCT: 1 %
Basophils Absolute: 0 10*3/uL (ref 0.0–0.1)
EOS ABS: 0.2 10*3/uL (ref 0.0–0.7)
EOS PCT: 6 %
HCT: 33 % — ABNORMAL LOW (ref 39.0–52.0)
HEMOGLOBIN: 10.8 g/dL — AB (ref 13.0–17.0)
Lymphocytes Relative: 20 %
Lymphs Abs: 0.6 10*3/uL — ABNORMAL LOW (ref 0.7–4.0)
MCH: 29.3 pg (ref 26.0–34.0)
MCHC: 32.7 g/dL (ref 30.0–36.0)
MCV: 89.4 fL (ref 78.0–100.0)
MONO ABS: 0.4 10*3/uL (ref 0.1–1.0)
MONOS PCT: 15 %
NEUTROS PCT: 58 %
Neutro Abs: 1.6 10*3/uL — ABNORMAL LOW (ref 1.7–7.7)
Platelets: 52 10*3/uL — ABNORMAL LOW (ref 150–400)
RBC: 3.69 MIL/uL — ABNORMAL LOW (ref 4.22–5.81)
RDW: 16.7 % — AB (ref 11.5–15.5)
WBC: 2.8 10*3/uL — ABNORMAL LOW (ref 4.0–10.5)

## 2017-12-04 LAB — BODY FLUID CELL COUNT WITH DIFFERENTIAL
EOS FL: 0 %
LYMPHS FL: 8 %
Monocyte-Macrophage-Serous Fluid: 83 % (ref 50–90)
Neutrophil Count, Fluid: 9 % (ref 0–25)
WBC FLUID: 125 uL (ref 0–1000)

## 2017-12-04 MED ORDER — ALBUMIN HUMAN 25 % IV SOLN
INTRAVENOUS | Status: AC
  Administered 2017-12-04: 25 g via INTRAVENOUS
  Filled 2017-12-04: qty 100

## 2017-12-04 MED ORDER — ALBUMIN HUMAN 25 % IV SOLN
25.0000 g | Freq: Once | INTRAVENOUS | Status: AC
  Administered 2017-12-04: 25 g via INTRAVENOUS

## 2017-12-04 NOTE — Procedures (Signed)
PreOperative Dx: Alcoholic cirrhosis, ascites Postoperative Dx: Alcoholic cirrhosis, ascites Procedure:   US guided paracentesis Radiologist:  Sora Olivo Anesthesia:  10 ml of 1% lidocaine Specimen:  6 L of yellow ascitic fluid EBL:   < 1 ml Complications:  None  

## 2017-12-05 LAB — PATHOLOGIST SMEAR REVIEW

## 2017-12-08 DIAGNOSIS — M25512 Pain in left shoulder: Secondary | ICD-10-CM | POA: Diagnosis not present

## 2017-12-08 DIAGNOSIS — G8929 Other chronic pain: Secondary | ICD-10-CM | POA: Diagnosis not present

## 2017-12-08 DIAGNOSIS — M25511 Pain in right shoulder: Secondary | ICD-10-CM | POA: Diagnosis not present

## 2017-12-08 DIAGNOSIS — M542 Cervicalgia: Secondary | ICD-10-CM | POA: Diagnosis not present

## 2017-12-08 DIAGNOSIS — Z79891 Long term (current) use of opiate analgesic: Secondary | ICD-10-CM | POA: Diagnosis not present

## 2017-12-08 DIAGNOSIS — M545 Low back pain: Secondary | ICD-10-CM | POA: Diagnosis not present

## 2017-12-08 DIAGNOSIS — G894 Chronic pain syndrome: Secondary | ICD-10-CM | POA: Diagnosis not present

## 2017-12-09 ENCOUNTER — Ambulatory Visit: Payer: Medicare Other | Admitting: Internal Medicine

## 2017-12-11 ENCOUNTER — Other Ambulatory Visit: Payer: Self-pay | Admitting: *Deleted

## 2017-12-11 DIAGNOSIS — R188 Other ascites: Secondary | ICD-10-CM

## 2017-12-11 MED ORDER — ALBUMIN HUMAN 25 % IV SOLN: 25.0000 g | Freq: Once | INTRAVENOUS | Status: AC

## 2017-12-11 NOTE — Telephone Encounter (Signed)
Order faxed over to radiology for standing orders.

## 2017-12-11 NOTE — Telephone Encounter (Signed)
He can be reached at [phone]

## 2017-12-11 NOTE — Telephone Encounter (Signed)
Patient called in stating that he is having some shortness of breath and tightness in his abdomen and needs another Para done.  He is scheduled to see RMR next Tuesday at 4:30

## 2017-12-11 NOTE — Telephone Encounter (Addendum)
He's having paras often enough, I think we should get him on standing orders. I'll fill it out and bring it to you.  Thanks.

## 2017-12-11 NOTE — Telephone Encounter (Signed)
Called spoke with patient. PARA scheduled for tomorrow 12/12/17 at 9:00am, arrival time 8:45am.

## 2017-12-12 ENCOUNTER — Ambulatory Visit (HOSPITAL_COMMUNITY)
Admission: RE | Admit: 2017-12-12 | Discharge: 2017-12-12 | Disposition: A | Discharge status: Home / Self Care | Payer: Medicare Other | Source: Ambulatory Visit | Attending: Nurse Practitioner | Admitting: Nurse Practitioner

## 2017-12-12 ENCOUNTER — Encounter (HOSPITAL_COMMUNITY): Payer: Self-pay

## 2017-12-12 DIAGNOSIS — K7460 Unspecified cirrhosis of liver: Secondary | ICD-10-CM | POA: Diagnosis not present

## 2017-12-12 DIAGNOSIS — R188 Other ascites: Secondary | ICD-10-CM

## 2017-12-12 LAB — BODY FLUID CELL COUNT WITH DIFFERENTIAL
Eos, Fluid: 0 %
LYMPHS FL: 9 %
Monocyte-Macrophage-Serous Fluid: 7 % — ABNORMAL LOW (ref 50–90)
Neutrophil Count, Fluid: 84 % — ABNORMAL HIGH (ref 0–25)
Total Nucleated Cell Count, Fluid: 589 cu mm (ref 0–1000)

## 2017-12-12 MED ORDER — ALBUMIN HUMAN 25 % IV SOLN
50.0000 g | Freq: Once | INTRAVENOUS | Status: AC
  Administered 2017-12-12: 50 g via INTRAVENOUS

## 2017-12-12 MED ORDER — ALBUMIN HUMAN 25 % IV SOLN
INTRAVENOUS | Status: AC
  Filled 2017-12-12: qty 200

## 2017-12-12 NOTE — Progress Notes (Signed)
Paracentesis complete no signs of distress, 10L ascites removed.

## 2017-12-12 NOTE — Procedures (Signed)
PreOperative Dx: Cirrhosis, ascites Postoperative Dx: Cirrhosis, ascites Procedure:   US guided paracentesis Radiologist:  Thornton Papas Anesthesia:  10 ml of1% lidocaine Specimen:  10 L of yellow ascitic fluid EBL:   < 1 ml Complications: None

## 2017-12-15 LAB — PATHOLOGIST SMEAR REVIEW

## 2017-12-16 ENCOUNTER — Telehealth: Payer: Self-pay

## 2017-12-16 ENCOUNTER — Ambulatory Visit: Payer: Medicare Other | Admitting: Internal Medicine

## 2017-12-16 NOTE — Telephone Encounter (Addendum)
I tried to call the patient to follow-up in regards to his appt for this afternoon, no answer, lmom.

## 2017-12-16 NOTE — Telephone Encounter (Addendum)
I spoke with Quentin Ore, RN in radiology at [facility] and he derma bonded the site and stopped the leaking.

## 2017-12-16 NOTE — Telephone Encounter (Signed)
Pt walked in office this morning at 8:00 AM, stating he was asked to be here at 8:00 AM.Pt is scheduled in office for 4:30PM. Pt also said he has some fluid leaking from his procedure last week at [facility](Paracentesis). After speaking with RMR, pt was asked to go back to [facility] and have them take care of the leakage pt is having s/p procedure and call our office back for a time to come back to the office to be seen.

## 2017-12-17 ENCOUNTER — Telehealth: Payer: Self-pay | Admitting: Gastroenterology

## 2017-12-18 ENCOUNTER — Telehealth: Payer: Self-pay | Admitting: Internal Medicine

## 2017-12-18 ENCOUNTER — Other Ambulatory Visit: Payer: Self-pay | Admitting: Nurse Practitioner

## 2017-12-18 DIAGNOSIS — R188 Other ascites: Secondary | ICD-10-CM

## 2017-12-18 NOTE — Telephone Encounter (Signed)
I'm sorry I did not see the previous notes with Dr.Jacobs. Dr.Jacobs will you accept to see patient again?

## 2017-12-18 NOTE — Telephone Encounter (Signed)
Patient has standing orders. He can call over to schedule on his own. Patient aware

## 2017-12-18 NOTE — Telephone Encounter (Signed)
Pt called asking to be scheduled for a para to be done tomorrow. He can be reached at [phone]. He said if he didn't answer to [facility]

## 2017-12-18 NOTE — Telephone Encounter (Signed)
This appears to be a previous patient of Dr. Ardis Hughs.

## 2017-12-19 ENCOUNTER — Ambulatory Visit (HOSPITAL_COMMUNITY)
Admission: RE | Admit: 2017-12-19 | Discharge: 2017-12-19 | Disposition: A | Discharge status: Home / Self Care | Payer: Medicare Other | Source: Ambulatory Visit | Attending: Nurse Practitioner | Admitting: Nurse Practitioner

## 2017-12-19 ENCOUNTER — Encounter (HOSPITAL_COMMUNITY): Payer: Self-pay

## 2017-12-19 DIAGNOSIS — R188 Other ascites: Secondary | ICD-10-CM | POA: Diagnosis not present

## 2017-12-19 DIAGNOSIS — K7460 Unspecified cirrhosis of liver: Secondary | ICD-10-CM | POA: Insufficient documentation

## 2017-12-19 LAB — BODY FLUID CELL COUNT WITH DIFFERENTIAL
EOS FL: 0 %
Lymphs, Fluid: 49 %
MONOCYTE-MACROPHAGE-SEROUS FLUID: 21 % — AB (ref 50–90)
NEUTROPHIL FLUID: 30 % — AB (ref 0–25)
Other Cells, Fluid: REACTIVE %
WBC FLUID: 185 uL (ref 0–1000)

## 2017-12-19 MED ORDER — ALBUMIN HUMAN 25 % IV SOLN
INTRAVENOUS | Status: AC
  Filled 2017-12-19: qty 50

## 2017-12-19 MED ORDER — ALBUMIN HUMAN 25 % IV SOLN
INTRAVENOUS | Status: AC
  Administered 2017-12-19: 50 g
  Filled 2017-12-19: qty 200

## 2017-12-19 NOTE — Sedation Documentation (Signed)
Pt tolerating procedure well.  No distress.  

## 2017-12-19 NOTE — Procedures (Signed)
PreOperative Dx: Cirrhosis, ascites Postoperative Dx: Cirrhosis, ascites Procedure:   US guided paracentesis Radiologist:  Thornton Papas Anesthesia:  10 ml of1% lidocaine Specimen:  7.2 L of yellow ascitic fluid EBL:   < 1 ml Complications: None

## 2017-12-19 NOTE — Sedation Documentation (Signed)
7.2 liters of fluid removed.  Pt tolerated well

## 2017-12-19 NOTE — Telephone Encounter (Signed)
I reviewed his records. He is getting excellent care currently and I would have nothing more to offer him.    Thanks

## 2017-12-23 NOTE — Telephone Encounter (Signed)
Patient has been notified of this 

## 2017-12-24 ENCOUNTER — Encounter (HOSPITAL_COMMUNITY): Payer: Self-pay

## 2017-12-24 DIAGNOSIS — Z5321 Procedure and treatment not carried out due to patient leaving prior to being seen by health care provider: Secondary | ICD-10-CM | POA: Insufficient documentation

## 2017-12-24 DIAGNOSIS — R531 Weakness: Secondary | ICD-10-CM | POA: Insufficient documentation

## 2017-12-24 LAB — PATHOLOGIST SMEAR REVIEW

## 2017-12-24 NOTE — ED Triage Notes (Signed)
Was seen on Friday for cirrhosis, had fluid drained off on Friday.  Having shortness of breath, feeling weak per pt.  Feels like that I need to be admitted per pt.

## 2017-12-25 ENCOUNTER — Emergency Department (HOSPITAL_COMMUNITY)
Admission: EM | Admit: 2017-12-25 | Discharge: 2017-12-25 | Disposition: A | Discharge status: Home / Self Care | Payer: Medicare Other | Attending: Emergency Medicine | Admitting: Emergency Medicine

## 2017-12-25 DIAGNOSIS — E119 Type 2 diabetes mellitus without complications: Secondary | ICD-10-CM | POA: Diagnosis not present

## 2017-12-25 DIAGNOSIS — K219 Gastro-esophageal reflux disease without esophagitis: Secondary | ICD-10-CM | POA: Diagnosis not present

## 2017-12-25 DIAGNOSIS — I10 Essential (primary) hypertension: Secondary | ICD-10-CM | POA: Diagnosis not present

## 2017-12-25 DIAGNOSIS — K7460 Unspecified cirrhosis of liver: Secondary | ICD-10-CM | POA: Diagnosis not present

## 2017-12-25 DIAGNOSIS — R0602 Shortness of breath: Secondary | ICD-10-CM | POA: Diagnosis not present

## 2017-12-25 DIAGNOSIS — R188 Other ascites: Secondary | ICD-10-CM | POA: Diagnosis not present

## 2017-12-25 DIAGNOSIS — E785 Hyperlipidemia, unspecified: Secondary | ICD-10-CM | POA: Diagnosis not present

## 2017-12-25 DIAGNOSIS — Z794 Long term (current) use of insulin: Secondary | ICD-10-CM | POA: Diagnosis not present

## 2017-12-25 DIAGNOSIS — Z79899 Other long term (current) drug therapy: Secondary | ICD-10-CM | POA: Diagnosis not present

## 2017-12-25 NOTE — ED Notes (Signed)
Follow up call made  No answer  1050  12/25/17 s Prakash Kimberling rn

## 2017-12-25 NOTE — ED Notes (Signed)
Called no answer

## 2017-12-29 ENCOUNTER — Encounter (HOSPITAL_COMMUNITY): Payer: Self-pay | Admitting: Emergency Medicine

## 2017-12-29 ENCOUNTER — Other Ambulatory Visit: Payer: Self-pay | Admitting: Nurse Practitioner

## 2017-12-29 ENCOUNTER — Other Ambulatory Visit: Payer: Self-pay

## 2017-12-29 ENCOUNTER — Emergency Department (HOSPITAL_COMMUNITY)
Admission: EM | Admit: 2017-12-29 | Discharge: 2017-12-29 | Disposition: A | Discharge status: Home / Self Care | Payer: Medicare Other | Attending: Emergency Medicine | Admitting: Emergency Medicine

## 2017-12-29 DIAGNOSIS — Z7984 Long term (current) use of oral hypoglycemic drugs: Secondary | ICD-10-CM | POA: Diagnosis not present

## 2017-12-29 DIAGNOSIS — R188 Other ascites: Secondary | ICD-10-CM

## 2017-12-29 DIAGNOSIS — I10 Essential (primary) hypertension: Secondary | ICD-10-CM | POA: Diagnosis not present

## 2017-12-29 DIAGNOSIS — Z96659 Presence of unspecified artificial knee joint: Secondary | ICD-10-CM | POA: Diagnosis not present

## 2017-12-29 DIAGNOSIS — Z833 Family history of diabetes mellitus: Secondary | ICD-10-CM | POA: Diagnosis not present

## 2017-12-29 DIAGNOSIS — R1909 Other intra-abdominal and pelvic swelling, mass and lump: Secondary | ICD-10-CM | POA: Diagnosis present

## 2017-12-29 DIAGNOSIS — Z5321 Procedure and treatment not carried out due to patient leaving prior to being seen by health care provider: Secondary | ICD-10-CM | POA: Diagnosis not present

## 2017-12-29 DIAGNOSIS — E119 Type 2 diabetes mellitus without complications: Secondary | ICD-10-CM | POA: Diagnosis not present

## 2017-12-29 DIAGNOSIS — R0602 Shortness of breath: Secondary | ICD-10-CM | POA: Diagnosis not present

## 2017-12-29 DIAGNOSIS — R9431 Abnormal electrocardiogram [ECG] [EKG]: Secondary | ICD-10-CM | POA: Diagnosis not present

## 2017-12-29 DIAGNOSIS — F17210 Nicotine dependence, cigarettes, uncomplicated: Secondary | ICD-10-CM | POA: Diagnosis not present

## 2017-12-29 DIAGNOSIS — D696 Thrombocytopenia, unspecified: Secondary | ICD-10-CM | POA: Diagnosis not present

## 2017-12-29 DIAGNOSIS — Z79899 Other long term (current) drug therapy: Secondary | ICD-10-CM | POA: Diagnosis not present

## 2017-12-29 DIAGNOSIS — E7800 Pure hypercholesterolemia, unspecified: Secondary | ICD-10-CM | POA: Diagnosis not present

## 2017-12-29 LAB — CBC
HCT: 30.7 % — ABNORMAL LOW (ref 39.0–52.0)
HEMOGLOBIN: 10.3 g/dL — AB (ref 13.0–17.0)
MCH: 30 pg (ref 26.0–34.0)
MCHC: 33.6 g/dL (ref 30.0–36.0)
MCV: 89.5 fL (ref 78.0–100.0)
Platelets: 63 10*3/uL — ABNORMAL LOW (ref 150–400)
RBC: 3.43 MIL/uL — AB (ref 4.22–5.81)
RDW: 16.4 % — ABNORMAL HIGH (ref 11.5–15.5)
WBC: 3.6 10*3/uL — AB (ref 4.0–10.5)

## 2017-12-29 LAB — COMPREHENSIVE METABOLIC PANEL
ALK PHOS: 141 U/L — AB (ref 38–126)
ALT: 17 U/L (ref 17–63)
ANION GAP: 7 (ref 5–15)
AST: 42 U/L — ABNORMAL HIGH (ref 15–41)
Albumin: 3.4 g/dL — ABNORMAL LOW (ref 3.5–5.0)
BILIRUBIN TOTAL: 1.5 mg/dL — AB (ref 0.3–1.2)
BUN: 16 mg/dL (ref 6–20)
CALCIUM: 8.3 mg/dL — AB (ref 8.9–10.3)
CO2: 26 mmol/L (ref 22–32)
Chloride: 99 mmol/L — ABNORMAL LOW (ref 101–111)
Creatinine, Ser: 1.17 mg/dL (ref 0.61–1.24)
GFR calc non Af Amer: >60 mL/min (ref >60)
Glucose, Bld: 159 mg/dL — ABNORMAL HIGH (ref 65–99)
Potassium: 4.1 mmol/L (ref 3.5–5.1)
SODIUM: 132 mmol/L — AB (ref 135–145)
TOTAL PROTEIN: 7 g/dL (ref 6.5–8.1)

## 2017-12-29 LAB — LIPASE, BLOOD: Lipase: 24 U/L (ref 11–51)

## 2017-12-29 MED ORDER — OXYCODONE HCL 5 MG PO TABS
10.0000 mg | ORAL_TABLET | Freq: Once | ORAL | Status: AC
  Administered 2017-12-29: 10 mg via ORAL
  Filled 2017-12-29: qty 2

## 2017-12-29 NOTE — ED Triage Notes (Signed)
Pt has ascites and reports he needs fluid drained from his abd.  Was told at [facility] today he needs to be admitted d/t low platlet count.  Pt left [place] and came to [facility], state he has had bad experiences at [facility].
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
## 2017-12-31 ENCOUNTER — Ambulatory Visit (HOSPITAL_COMMUNITY)
Admission: RE | Admit: 2017-12-31 | Discharge: 2017-12-31 | Disposition: A | Discharge status: Home / Self Care | Payer: Medicare Other | Source: Ambulatory Visit | Attending: Nurse Practitioner | Admitting: Nurse Practitioner

## 2017-12-31 ENCOUNTER — Encounter (HOSPITAL_COMMUNITY): Payer: Self-pay

## 2017-12-31 ENCOUNTER — Telehealth: Payer: Self-pay

## 2017-12-31 DIAGNOSIS — R188 Other ascites: Secondary | ICD-10-CM

## 2017-12-31 DIAGNOSIS — K7031 Alcoholic cirrhosis of liver with ascites: Secondary | ICD-10-CM | POA: Insufficient documentation

## 2017-12-31 LAB — BODY FLUID CELL COUNT WITH DIFFERENTIAL
EOS FL: 0 %
Lymphs, Fluid: 22 %
Monocyte-Macrophage-Serous Fluid: 64 % (ref 50–90)
Neutrophil Count, Fluid: 14 % (ref 0–25)
OTHER CELLS FL: REACTIVE %
Total Nucleated Cell Count, Fluid: 145 cu mm (ref 0–1000)

## 2017-12-31 LAB — GRAM STAIN

## 2017-12-31 MED ORDER — ALBUMIN HUMAN 25 % IV SOLN
25.0000 g | Freq: Once | INTRAVENOUS | Status: AC
  Administered 2017-12-31: 25 g via INTRAVENOUS

## 2017-12-31 MED ORDER — ALBUMIN HUMAN 25 % IV SOLN
INTRAVENOUS | Status: AC
  Administered 2017-12-31: 25 g via INTRAVENOUS
  Filled 2017-12-31: qty 100

## 2017-12-31 NOTE — Telephone Encounter (Signed)
Received a call from [place] at [facility]. Pt had a paracentesis and has a Gram positive Cocci.

## 2017-12-31 NOTE — Progress Notes (Signed)
Paracentesis complete no signs of distress.  

## 2017-12-31 NOTE — Procedures (Signed)
PreOperative Dx: Alcoholic cirrhosis, ascites Postoperative Dx: Alcoholic cirrhosis, ascites Procedure:   US guided paracentesis Radiologist:  Thornton Papas Anesthesia:  10 ml of1% lidocaine Specimen:  6.8 L of clear yellow ascitic fluid EBL:   < 1 ml Complications: None

## 2018-01-01 LAB — PATHOLOGIST SMEAR REVIEW

## 2018-01-02 NOTE — Telephone Encounter (Signed)
See fluid lab results for details.

## 2018-01-05 LAB — CULTURE, BODY FLUID W GRAM STAIN -BOTTLE: Culture: NO GROWTH

## 2018-01-05 LAB — CULTURE, BODY FLUID-BOTTLE

## 2018-01-12 DIAGNOSIS — Z1159 Encounter for screening for other viral diseases: Secondary | ICD-10-CM | POA: Diagnosis not present

## 2018-01-12 DIAGNOSIS — I10 Essential (primary) hypertension: Secondary | ICD-10-CM | POA: Diagnosis not present

## 2018-01-12 DIAGNOSIS — M545 Low back pain: Secondary | ICD-10-CM | POA: Diagnosis not present

## 2018-01-12 DIAGNOSIS — E119 Type 2 diabetes mellitus without complications: Secondary | ICD-10-CM | POA: Diagnosis not present

## 2018-01-12 DIAGNOSIS — Z79891 Long term (current) use of opiate analgesic: Secondary | ICD-10-CM | POA: Diagnosis not present

## 2018-01-12 DIAGNOSIS — M546 Pain in thoracic spine: Secondary | ICD-10-CM | POA: Diagnosis not present

## 2018-01-12 DIAGNOSIS — K766 Portal hypertension: Secondary | ICD-10-CM | POA: Diagnosis not present

## 2018-01-12 DIAGNOSIS — G8929 Other chronic pain: Secondary | ICD-10-CM | POA: Diagnosis not present

## 2018-01-12 DIAGNOSIS — Z79899 Other long term (current) drug therapy: Secondary | ICD-10-CM | POA: Diagnosis not present

## 2018-01-12 DIAGNOSIS — M542 Cervicalgia: Secondary | ICD-10-CM | POA: Diagnosis not present

## 2018-01-12 DIAGNOSIS — G894 Chronic pain syndrome: Secondary | ICD-10-CM | POA: Diagnosis not present

## 2018-01-12 DIAGNOSIS — D61818 Other pancytopenia: Secondary | ICD-10-CM | POA: Diagnosis not present

## 2018-01-12 DIAGNOSIS — K5730 Diverticulosis of large intestine without perforation or abscess without bleeding: Secondary | ICD-10-CM | POA: Diagnosis not present

## 2018-01-12 DIAGNOSIS — Z882 Allergy status to sulfonamides status: Secondary | ICD-10-CM | POA: Diagnosis not present

## 2018-01-12 DIAGNOSIS — Z7982 Long term (current) use of aspirin: Secondary | ICD-10-CM | POA: Diagnosis not present

## 2018-01-12 DIAGNOSIS — E46 Unspecified protein-calorie malnutrition: Secondary | ICD-10-CM | POA: Diagnosis not present

## 2018-01-13 ENCOUNTER — Other Ambulatory Visit: Payer: Self-pay | Admitting: Nurse Practitioner

## 2018-01-13 DIAGNOSIS — R188 Other ascites: Secondary | ICD-10-CM

## 2018-01-14 ENCOUNTER — Ambulatory Visit (HOSPITAL_COMMUNITY): Payer: Medicare Other

## 2018-01-15 ENCOUNTER — Ambulatory Visit (HOSPITAL_COMMUNITY)
Admission: RE | Admit: 2018-01-15 | Discharge: 2018-01-15 | Disposition: A | Discharge status: Home / Self Care | Payer: Medicare Other | Source: Ambulatory Visit | Attending: Nurse Practitioner | Admitting: Nurse Practitioner

## 2018-01-15 DIAGNOSIS — R188 Other ascites: Secondary | ICD-10-CM | POA: Insufficient documentation

## 2018-01-15 LAB — BODY FLUID CELL COUNT WITH DIFFERENTIAL
Eos, Fluid: 0 %
Lymphs, Fluid: 36 %
Monocyte-Macrophage-Serous Fluid: 44 % — ABNORMAL LOW (ref 50–90)
NEUTROPHIL FLUID: 20 % (ref 0–25)
WBC FLUID: 167 uL (ref 0–1000)

## 2018-01-15 LAB — GRAM STAIN: Gram Stain: NONE SEEN

## 2018-01-15 MED ORDER — ALBUMIN HUMAN 25 % IV SOLN
INTRAVENOUS | Status: AC
  Administered 2018-01-15: 25 g via INTRAVENOUS
  Filled 2018-01-15: qty 100

## 2018-01-15 MED ORDER — ALBUMIN HUMAN 25 % IV SOLN
25.0000 g | Freq: Once | INTRAVENOUS | Status: AC
  Administered 2018-01-15: 25 g via INTRAVENOUS

## 2018-01-15 NOTE — Procedures (Signed)
   US paracentesis RLQ 5 L yellow fluid Sent for labs per MD  Tolerated well

## 2018-01-16 LAB — PATHOLOGIST SMEAR REVIEW

## 2018-01-19 NOTE — Progress Notes (Signed)
Negative SBP. Further recommendations per Randall Hiss.

## 2018-01-20 LAB — CULTURE, BODY FLUID-BOTTLE

## 2018-01-20 LAB — CULTURE, BODY FLUID W GRAM STAIN -BOTTLE: Culture: NO GROWTH

## 2018-01-26 ENCOUNTER — Other Ambulatory Visit: Payer: Self-pay | Admitting: Nurse Practitioner

## 2018-01-26 DIAGNOSIS — R188 Other ascites: Secondary | ICD-10-CM

## 2018-01-27 ENCOUNTER — Ambulatory Visit (HOSPITAL_COMMUNITY)
Admission: RE | Admit: 2018-01-27 | Discharge: 2018-01-27 | Disposition: A | Discharge status: Home / Self Care | Payer: Medicare Other | Source: Ambulatory Visit | Attending: Nurse Practitioner | Admitting: Nurse Practitioner

## 2018-01-27 DIAGNOSIS — R188 Other ascites: Secondary | ICD-10-CM | POA: Diagnosis not present

## 2018-01-27 LAB — PROTIME-INR
INR: 1.46
PROTHROMBIN TIME: 17.6 s — AB (ref 11.4–15.2)

## 2018-01-27 LAB — PLATELET COUNT: PLATELETS: 60 10*3/uL — AB (ref 150–400)

## 2018-01-27 MED ORDER — ALBUMIN HUMAN 25 % IV SOLN
INTRAVENOUS | Status: AC
  Administered 2018-01-27: 25 g
  Filled 2018-01-27: qty 100

## 2018-01-27 NOTE — Sedation Documentation (Signed)
Vital signs stable. 

## 2018-01-27 NOTE — Discharge Instructions (Signed)
Paracentesis, Care After °Refer to this sheet in the next few weeks. These instructions provide you with information about caring for yourself after your procedure. Your health care provider may also give you more specific instructions. Your treatment has been planned according to current medical practices, but problems sometimes occur. Call your health care provider if you have any problems or questions after your procedure. °What can I expect after the procedure? °After your procedure, it is common to have a small amount of clear fluid coming from the puncture site. °Follow these instructions at home: °· Return to your normal activities as told by your health care provider. Ask your health care provider what activities are safe for you. °· Take over-the-counter and prescription medicines only as told by your health care provider. °· Do not take baths, swim, or use a hot tub until your health care provider approves. °· Follow instructions from your health care provider about: °? How to take care of your puncture site. °? When and how you should change your bandage (dressing). °? When you should remove your dressing. °· Check your puncture area every day signs of infection. Watch for: °? Redness, swelling, or pain. °? Fluid, blood, or pus. °· Keep all follow-up visits as told by your health care provider. This is important. °Contact a health care provider if: °· You have redness, swelling, or pain at your puncture site. °· You start to have more clear fluid coming from your puncture site. °· You have blood or pus coming from your puncture site. °· You have chills. °· You have a fever. °Get help right away if: °· You develop chest pain or shortness of breath. °· You develop increasing pain, discomfort, or swelling in your abdomen. °· You feel dizzy or light-headed or you pass out. °This information is not intended to replace advice given to you by your health care provider. Make sure you discuss any questions you  have with your health care provider. °Document Released: 11/29/2014 Document Revised: 12/21/2015 Document Reviewed: 09/27/2014 °Elsevier Interactive Patient Education © 2018 Elsevier Inc. ° °

## 2018-01-27 NOTE — Sedation Documentation (Signed)
Tolerating procedure without any difficulty.

## 2018-01-28 DIAGNOSIS — M129 Arthropathy, unspecified: Secondary | ICD-10-CM | POA: Diagnosis not present

## 2018-01-28 DIAGNOSIS — Z79899 Other long term (current) drug therapy: Secondary | ICD-10-CM | POA: Diagnosis not present

## 2018-01-28 DIAGNOSIS — M542 Cervicalgia: Secondary | ICD-10-CM | POA: Diagnosis not present

## 2018-01-28 DIAGNOSIS — M545 Low back pain: Secondary | ICD-10-CM | POA: Diagnosis not present

## 2018-02-02 ENCOUNTER — Other Ambulatory Visit: Payer: Self-pay | Admitting: Nurse Practitioner

## 2018-02-02 DIAGNOSIS — R188 Other ascites: Secondary | ICD-10-CM

## 2018-02-04 ENCOUNTER — Ambulatory Visit (HOSPITAL_COMMUNITY)
Admission: RE | Admit: 2018-02-04 | Discharge: 2018-02-04 | Disposition: A | Discharge status: Home / Self Care | Payer: Medicare Other | Source: Ambulatory Visit | Attending: Nurse Practitioner | Admitting: Nurse Practitioner

## 2018-02-04 DIAGNOSIS — R188 Other ascites: Secondary | ICD-10-CM | POA: Diagnosis not present

## 2018-02-04 LAB — BODY FLUID CELL COUNT WITH DIFFERENTIAL
Eos, Fluid: 0 %
Lymphs, Fluid: 14 %
Monocyte-Macrophage-Serous Fluid: 76 % (ref 50–90)
Neutrophil Count, Fluid: 10 % (ref 0–25)
OTHER CELLS FL: REACTIVE %
Total Nucleated Cell Count, Fluid: 133 cu mm (ref 0–1000)

## 2018-02-04 LAB — GRAM STAIN: GRAM STAIN: NONE SEEN

## 2018-02-04 MED ORDER — ALBUMIN HUMAN 25 % IV SOLN
INTRAVENOUS | Status: AC
  Administered 2018-02-04: 37.5 g
  Filled 2018-02-04: qty 150

## 2018-02-04 NOTE — Procedures (Signed)
PreOperative Dx: Alcoholic cirrhosis, ascites Postoperative Dx: Alcoholic cirrhosis, ascites Procedure:   US guided paracentesis Radiologist:  Thornton Papas Anesthesia:  10 ml of1% lidocaine Specimen:  9.5 L of yellow ascitic fluid EBL:   < 1 ml Complications: None

## 2018-02-04 NOTE — Sedation Documentation (Signed)
Patient denies pain and is resting comfortably.  

## 2018-02-04 NOTE — Sedation Documentation (Signed)
Vital signs stable. 

## 2018-02-05 LAB — PATHOLOGIST SMEAR REVIEW

## 2018-02-06 DIAGNOSIS — R161 Splenomegaly, not elsewhere classified: Secondary | ICD-10-CM | POA: Diagnosis not present

## 2018-02-06 DIAGNOSIS — I340 Nonrheumatic mitral (valve) insufficiency: Secondary | ICD-10-CM | POA: Diagnosis not present

## 2018-02-06 DIAGNOSIS — K7469 Other cirrhosis of liver: Secondary | ICD-10-CM | POA: Diagnosis not present

## 2018-02-06 DIAGNOSIS — C220 Liver cell carcinoma: Secondary | ICD-10-CM | POA: Diagnosis not present

## 2018-02-06 DIAGNOSIS — K766 Portal hypertension: Secondary | ICD-10-CM | POA: Diagnosis not present

## 2018-02-06 DIAGNOSIS — I517 Cardiomegaly: Secondary | ICD-10-CM | POA: Diagnosis not present

## 2018-02-06 DIAGNOSIS — I868 Varicose veins of other specified sites: Secondary | ICD-10-CM | POA: Diagnosis not present

## 2018-02-09 LAB — CULTURE, BODY FLUID W GRAM STAIN -BOTTLE: Culture: NO GROWTH

## 2018-02-09 LAB — CULTURE, BODY FLUID-BOTTLE

## 2018-02-12 ENCOUNTER — Other Ambulatory Visit: Payer: Self-pay | Admitting: Nurse Practitioner

## 2018-02-12 DIAGNOSIS — K7031 Alcoholic cirrhosis of liver with ascites: Secondary | ICD-10-CM

## 2018-02-13 ENCOUNTER — Encounter (HOSPITAL_COMMUNITY): Payer: Self-pay

## 2018-02-13 ENCOUNTER — Ambulatory Visit (HOSPITAL_COMMUNITY)
Admission: RE | Admit: 2018-02-13 | Discharge: 2018-02-13 | Disposition: A | Discharge status: Home / Self Care | Payer: Medicare Other | Source: Ambulatory Visit | Attending: Nurse Practitioner | Admitting: Nurse Practitioner

## 2018-02-13 DIAGNOSIS — R188 Other ascites: Secondary | ICD-10-CM | POA: Diagnosis not present

## 2018-02-13 DIAGNOSIS — K7031 Alcoholic cirrhosis of liver with ascites: Secondary | ICD-10-CM

## 2018-02-13 MED ORDER — ALBUMIN HUMAN 25 % IV SOLN
INTRAVENOUS | Status: AC
  Administered 2018-02-13: 25 g via INTRAVENOUS
  Filled 2018-02-13: qty 100

## 2018-02-13 MED ORDER — ALBUMIN HUMAN 25 % IV SOLN
25.0000 g | Freq: Once | INTRAVENOUS | Status: AC
  Administered 2018-02-13: 25 g via INTRAVENOUS

## 2018-02-13 NOTE — Procedures (Signed)
PreOperative Dx: Alcoholic cirrhosis, ascites Postoperative Dx: Alcoholic cirrhosis, ascites Procedure:   US guided paracentesis Radiologist:  Thornton Papas Anesthesia:  10 ml of1% lidocaine Specimen:  8.4 L of yellow ascitic fluid EBL:   < 1 ml Complications: None

## 2018-02-13 NOTE — Progress Notes (Signed)
Paracentesis complete no signs of distress.  

## 2018-02-16 ENCOUNTER — Other Ambulatory Visit: Payer: Self-pay | Admitting: Nurse Practitioner

## 2018-02-16 ENCOUNTER — Other Ambulatory Visit (HOSPITAL_COMMUNITY): Payer: Self-pay | Admitting: Diagnostic Radiology

## 2018-02-16 DIAGNOSIS — K7031 Alcoholic cirrhosis of liver with ascites: Secondary | ICD-10-CM

## 2018-02-17 ENCOUNTER — Ambulatory Visit (HOSPITAL_COMMUNITY)
Admission: RE | Admit: 2018-02-17 | Discharge: 2018-02-17 | Disposition: A | Discharge status: Home / Self Care | Payer: Medicare Other | Source: Ambulatory Visit | Attending: Nurse Practitioner | Admitting: Nurse Practitioner

## 2018-02-17 DIAGNOSIS — K7031 Alcoholic cirrhosis of liver with ascites: Secondary | ICD-10-CM

## 2018-02-17 DIAGNOSIS — R188 Other ascites: Secondary | ICD-10-CM | POA: Insufficient documentation

## 2018-02-17 LAB — BODY FLUID CELL COUNT WITH DIFFERENTIAL
Eos, Fluid: 0 %
LYMPHS FL: 47 %
MONOCYTE-MACROPHAGE-SEROUS FLUID: 35 % — AB (ref 50–90)
Neutrophil Count, Fluid: 18 % (ref 0–25)
OTHER CELLS FL: REACTIVE %
Total Nucleated Cell Count, Fluid: 146 cu mm (ref 0–1000)

## 2018-02-17 MED ORDER — ALBUMIN HUMAN 25 % IV SOLN
INTRAVENOUS | Status: AC
  Administered 2018-02-17: 25 g via INTRAVENOUS
  Filled 2018-02-17: qty 100

## 2018-02-17 MED ORDER — ALBUMIN HUMAN 25 % IV SOLN
25.0000 g | Freq: Once | INTRAVENOUS | Status: AC
  Administered 2018-02-17: 25 g via INTRAVENOUS

## 2018-02-17 NOTE — Progress Notes (Signed)
Paracentesis complete no signs of distress.  

## 2018-02-18 DIAGNOSIS — C229 Malignant neoplasm of liver, not specified as primary or secondary: Secondary | ICD-10-CM | POA: Diagnosis not present

## 2018-02-18 DIAGNOSIS — M25519 Pain in unspecified shoulder: Secondary | ICD-10-CM | POA: Diagnosis not present

## 2018-02-18 DIAGNOSIS — M545 Low back pain: Secondary | ICD-10-CM | POA: Diagnosis not present

## 2018-02-18 DIAGNOSIS — D696 Thrombocytopenia, unspecified: Secondary | ICD-10-CM | POA: Diagnosis not present

## 2018-02-18 LAB — PATHOLOGIST SMEAR REVIEW

## 2018-02-24 DIAGNOSIS — M25519 Pain in unspecified shoulder: Secondary | ICD-10-CM | POA: Diagnosis not present

## 2018-02-24 DIAGNOSIS — C229 Malignant neoplasm of liver, not specified as primary or secondary: Secondary | ICD-10-CM | POA: Diagnosis not present

## 2018-02-24 DIAGNOSIS — M545 Low back pain: Secondary | ICD-10-CM | POA: Diagnosis not present

## 2018-02-25 ENCOUNTER — Other Ambulatory Visit: Payer: Self-pay | Admitting: Nurse Practitioner

## 2018-02-25 ENCOUNTER — Encounter (HOSPITAL_COMMUNITY): Payer: Self-pay

## 2018-02-25 ENCOUNTER — Ambulatory Visit (HOSPITAL_COMMUNITY)
Admission: RE | Admit: 2018-02-25 | Discharge: 2018-02-25 | Disposition: A | Discharge status: Home / Self Care | Payer: Medicare Other | Source: Ambulatory Visit | Attending: Nurse Practitioner | Admitting: Nurse Practitioner

## 2018-02-25 DIAGNOSIS — K7031 Alcoholic cirrhosis of liver with ascites: Secondary | ICD-10-CM | POA: Diagnosis not present

## 2018-02-25 LAB — BODY FLUID CELL COUNT WITH DIFFERENTIAL
Eos, Fluid: 0 %
LYMPHS FL: 34 %
Monocyte-Macrophage-Serous Fluid: 56 % (ref 50–90)
NEUTROPHIL FLUID: 10 % (ref 0–25)
OTHER CELLS FL: REACTIVE %
WBC FLUID: 99 uL (ref 0–1000)

## 2018-02-25 LAB — GRAM STAIN

## 2018-02-25 MED ORDER — ALBUMIN HUMAN 25 % IV SOLN
25.0000 g | Freq: Once | INTRAVENOUS | Status: AC
  Administered 2018-02-25: 25 g via INTRAVENOUS

## 2018-02-25 NOTE — Procedures (Signed)
PreOperative Dx: Alcoholic cirrhosis, ascites Postoperative Dx: Alcoholic cirrhosis, ascites Procedure:   US guided paracentesis Radiologist:  Thornton Papas Anesthesia:  10 ml of1% lidocaine Specimen:  4.7 L of yellow ascitic fluid EBL:   < 1 ml Complications: None

## 2018-02-25 NOTE — Progress Notes (Signed)
Paracentesis complete no signs of distress.  

## 2018-02-27 LAB — PATHOLOGIST SMEAR REVIEW

## 2018-03-02 DIAGNOSIS — Z79891 Long term (current) use of opiate analgesic: Secondary | ICD-10-CM | POA: Diagnosis not present

## 2018-03-02 DIAGNOSIS — M25511 Pain in right shoulder: Secondary | ICD-10-CM | POA: Diagnosis not present

## 2018-03-02 DIAGNOSIS — C220 Liver cell carcinoma: Secondary | ICD-10-CM | POA: Diagnosis not present

## 2018-03-02 DIAGNOSIS — M25512 Pain in left shoulder: Secondary | ICD-10-CM | POA: Diagnosis not present

## 2018-03-02 DIAGNOSIS — M545 Low back pain: Secondary | ICD-10-CM | POA: Diagnosis not present

## 2018-03-02 DIAGNOSIS — G8929 Other chronic pain: Secondary | ICD-10-CM | POA: Diagnosis not present

## 2018-03-02 DIAGNOSIS — E119 Type 2 diabetes mellitus without complications: Secondary | ICD-10-CM | POA: Diagnosis not present

## 2018-03-02 DIAGNOSIS — Z79899 Other long term (current) drug therapy: Secondary | ICD-10-CM | POA: Diagnosis not present

## 2018-03-02 DIAGNOSIS — R188 Other ascites: Secondary | ICD-10-CM | POA: Diagnosis not present

## 2018-03-02 DIAGNOSIS — M79601 Pain in right arm: Secondary | ICD-10-CM | POA: Diagnosis not present

## 2018-03-02 LAB — CULTURE, BODY FLUID W GRAM STAIN -BOTTLE

## 2018-03-02 LAB — CULTURE, BODY FLUID-BOTTLE: CULTURE: NO GROWTH

## 2018-03-03 ENCOUNTER — Other Ambulatory Visit: Payer: Self-pay | Admitting: Nurse Practitioner

## 2018-03-03 DIAGNOSIS — R188 Other ascites: Secondary | ICD-10-CM

## 2018-03-04 ENCOUNTER — Ambulatory Visit (HOSPITAL_COMMUNITY)
Admission: RE | Admit: 2018-03-04 | Discharge: 2018-03-04 | Disposition: A | Discharge status: Home / Self Care | Payer: Medicare Other | Source: Ambulatory Visit | Attending: Nurse Practitioner | Admitting: Nurse Practitioner

## 2018-03-04 DIAGNOSIS — K7581 Nonalcoholic steatohepatitis (NASH): Secondary | ICD-10-CM | POA: Insufficient documentation

## 2018-03-04 DIAGNOSIS — R188 Other ascites: Secondary | ICD-10-CM | POA: Insufficient documentation

## 2018-03-04 LAB — GRAM STAIN

## 2018-03-04 LAB — BODY FLUID CELL COUNT WITH DIFFERENTIAL
Eos, Fluid: 0 %
Lymphs, Fluid: 31 %
Monocyte-Macrophage-Serous Fluid: 60 % (ref 50–90)
Neutrophil Count, Fluid: 9 % (ref 0–25)
OTHER CELLS FL: REACTIVE %
Total Nucleated Cell Count, Fluid: 117 cu mm (ref 0–1000)

## 2018-03-04 MED ORDER — ALBUMIN HUMAN 25 % IV SOLN
25.0000 g | Freq: Once | INTRAVENOUS | Status: AC
  Administered 2018-03-04: 25 g via INTRAVENOUS

## 2018-03-04 MED ORDER — ALBUMIN HUMAN 25 % IV SOLN
INTRAVENOUS | Status: AC
  Administered 2018-03-04: 25 g via INTRAVENOUS
  Filled 2018-03-04: qty 100

## 2018-03-04 MED ORDER — ALBUMIN HUMAN 25 % IV SOLN: 50.0000 g | Freq: Once | INTRAVENOUS | Status: DC

## 2018-03-04 NOTE — Progress Notes (Signed)
Paracentesis complete no signs of distress.  

## 2018-03-04 NOTE — Procedures (Signed)
PreOperative Dx: Alcoholic cirrhosis, ascites Postoperative Dx: Alcoholic cirrhosis, ascites Procedure:   US guided paracentesis Radiologist:  Thornton Papas Anesthesia:  10 ml of1% lidocaine Specimen:  6.2 L of yellow ascitic fluid EBL:   < 1 ml Complications: None

## 2018-03-05 LAB — PATHOLOGIST SMEAR REVIEW

## 2018-03-06 DIAGNOSIS — S20212A Contusion of left front wall of thorax, initial encounter: Secondary | ICD-10-CM | POA: Diagnosis not present

## 2018-03-06 DIAGNOSIS — C229 Malignant neoplasm of liver, not specified as primary or secondary: Secondary | ICD-10-CM | POA: Diagnosis not present

## 2018-03-06 DIAGNOSIS — E7800 Pure hypercholesterolemia, unspecified: Secondary | ICD-10-CM | POA: Diagnosis not present

## 2018-03-06 DIAGNOSIS — K219 Gastro-esophageal reflux disease without esophagitis: Secondary | ICD-10-CM | POA: Diagnosis not present

## 2018-03-06 DIAGNOSIS — W108XXA Fall (on) (from) other stairs and steps, initial encounter: Secondary | ICD-10-CM | POA: Diagnosis not present

## 2018-03-06 DIAGNOSIS — E119 Type 2 diabetes mellitus without complications: Secondary | ICD-10-CM | POA: Diagnosis not present

## 2018-03-06 DIAGNOSIS — R0781 Pleurodynia: Secondary | ICD-10-CM | POA: Diagnosis not present

## 2018-03-06 DIAGNOSIS — I10 Essential (primary) hypertension: Secondary | ICD-10-CM | POA: Diagnosis not present

## 2018-03-06 DIAGNOSIS — S299XXA Unspecified injury of thorax, initial encounter: Secondary | ICD-10-CM | POA: Diagnosis not present

## 2018-03-06 DIAGNOSIS — Z79899 Other long term (current) drug therapy: Secondary | ICD-10-CM | POA: Diagnosis not present

## 2018-03-07 ENCOUNTER — Emergency Department (HOSPITAL_COMMUNITY)
Admission: EM | Admit: 2018-03-07 | Discharge: 2018-03-07 | Disposition: A | Discharge status: Home / Self Care | Payer: Medicare Other | Attending: Emergency Medicine | Admitting: Emergency Medicine

## 2018-03-07 ENCOUNTER — Emergency Department (HOSPITAL_COMMUNITY): Payer: Medicare Other

## 2018-03-07 ENCOUNTER — Encounter (HOSPITAL_COMMUNITY): Payer: Self-pay | Admitting: Emergency Medicine

## 2018-03-07 ENCOUNTER — Other Ambulatory Visit: Payer: Self-pay

## 2018-03-07 DIAGNOSIS — M549 Dorsalgia, unspecified: Secondary | ICD-10-CM | POA: Insufficient documentation

## 2018-03-07 DIAGNOSIS — F17210 Nicotine dependence, cigarettes, uncomplicated: Secondary | ICD-10-CM | POA: Insufficient documentation

## 2018-03-07 DIAGNOSIS — E119 Type 2 diabetes mellitus without complications: Secondary | ICD-10-CM | POA: Diagnosis not present

## 2018-03-07 DIAGNOSIS — K7031 Alcoholic cirrhosis of liver with ascites: Secondary | ICD-10-CM | POA: Diagnosis not present

## 2018-03-07 DIAGNOSIS — R42 Dizziness and giddiness: Secondary | ICD-10-CM | POA: Insufficient documentation

## 2018-03-07 DIAGNOSIS — M25519 Pain in unspecified shoulder: Secondary | ICD-10-CM | POA: Diagnosis not present

## 2018-03-07 DIAGNOSIS — M79606 Pain in leg, unspecified: Secondary | ICD-10-CM | POA: Diagnosis not present

## 2018-03-07 DIAGNOSIS — R1084 Generalized abdominal pain: Secondary | ICD-10-CM | POA: Diagnosis not present

## 2018-03-07 DIAGNOSIS — E860 Dehydration: Secondary | ICD-10-CM

## 2018-03-07 DIAGNOSIS — M79603 Pain in arm, unspecified: Secondary | ICD-10-CM | POA: Diagnosis not present

## 2018-03-07 DIAGNOSIS — R630 Anorexia: Secondary | ICD-10-CM | POA: Diagnosis not present

## 2018-03-07 DIAGNOSIS — Z8589 Personal history of malignant neoplasm of other organs and systems: Secondary | ICD-10-CM | POA: Diagnosis not present

## 2018-03-07 DIAGNOSIS — R197 Diarrhea, unspecified: Secondary | ICD-10-CM | POA: Diagnosis not present

## 2018-03-07 DIAGNOSIS — R10817 Generalized abdominal tenderness: Secondary | ICD-10-CM | POA: Diagnosis not present

## 2018-03-07 DIAGNOSIS — R52 Pain, unspecified: Secondary | ICD-10-CM

## 2018-03-07 DIAGNOSIS — I10 Essential (primary) hypertension: Secondary | ICD-10-CM | POA: Diagnosis not present

## 2018-03-07 DIAGNOSIS — R627 Adult failure to thrive: Secondary | ICD-10-CM | POA: Diagnosis present

## 2018-03-07 DIAGNOSIS — M791 Myalgia, unspecified site: Secondary | ICD-10-CM | POA: Diagnosis not present

## 2018-03-07 DIAGNOSIS — R609 Edema, unspecified: Secondary | ICD-10-CM | POA: Diagnosis not present

## 2018-03-07 HISTORY — DX: Malignant (primary) neoplasm, unspecified: C80.1

## 2018-03-07 LAB — URINALYSIS, ROUTINE W REFLEX MICROSCOPIC
Bilirubin Urine: NEGATIVE
GLUCOSE, UA: NEGATIVE mg/dL
KETONES UR: NEGATIVE mg/dL
Leukocytes, UA: NEGATIVE
Nitrite: NEGATIVE
PH: 8 (ref 5.0–8.0)
Protein, ur: NEGATIVE mg/dL
SPECIFIC GRAVITY, URINE: 1.013 (ref 1.005–1.030)

## 2018-03-07 LAB — COMPREHENSIVE METABOLIC PANEL
ALT: 29 U/L (ref 0–44)
AST: 44 U/L — ABNORMAL HIGH (ref 15–41)
Albumin: 3.5 g/dL (ref 3.5–5.0)
Alkaline Phosphatase: 199 U/L — ABNORMAL HIGH (ref 38–126)
Anion gap: 8 (ref 5–15)
BILIRUBIN TOTAL: 1.7 mg/dL — AB (ref 0.3–1.2)
BUN: 12 mg/dL (ref 6–20)
CHLORIDE: 102 mmol/L (ref 98–111)
CO2: 23 mmol/L (ref 22–32)
CREATININE: 0.92 mg/dL (ref 0.61–1.24)
Calcium: 8.8 mg/dL — ABNORMAL LOW (ref 8.9–10.3)
GFR calc Af Amer: >60 mL/min (ref >60)
Glucose, Bld: 116 mg/dL — ABNORMAL HIGH (ref 70–99)
Potassium: 4.1 mmol/L (ref 3.5–5.1)
Sodium: 133 mmol/L — ABNORMAL LOW (ref 135–145)
TOTAL PROTEIN: 6.7 g/dL (ref 6.5–8.1)

## 2018-03-07 LAB — CBC WITH DIFFERENTIAL/PLATELET
BASOS ABS: 0 10*3/uL (ref 0.0–0.1)
BASOS PCT: 0 %
EOS PCT: 1 %
Eosinophils Absolute: 0.1 10*3/uL (ref 0.0–0.7)
HCT: 32.1 % — ABNORMAL LOW (ref 39.0–52.0)
Hemoglobin: 10.5 g/dL — ABNORMAL LOW (ref 13.0–17.0)
LYMPHS PCT: 14 %
Lymphs Abs: 0.5 10*3/uL — ABNORMAL LOW (ref 0.7–4.0)
MCH: 27.9 pg (ref 26.0–34.0)
MCHC: 32.7 g/dL (ref 30.0–36.0)
MCV: 85.1 fL (ref 78.0–100.0)
MONO ABS: 0.5 10*3/uL (ref 0.1–1.0)
Monocytes Relative: 14 %
Neutro Abs: 2.6 10*3/uL (ref 1.7–7.7)
Neutrophils Relative %: 71 %
PLATELETS: 60 10*3/uL — AB (ref 150–400)
RBC: 3.77 MIL/uL — ABNORMAL LOW (ref 4.22–5.81)
RDW: 15.4 % (ref 11.5–15.5)
WBC: 3.7 10*3/uL — AB (ref 4.0–10.5)

## 2018-03-07 LAB — LIPASE, BLOOD: LIPASE: 21 U/L (ref 11–51)

## 2018-03-07 LAB — TROPONIN I

## 2018-03-07 LAB — PROTIME-INR
INR: 1.39
PROTHROMBIN TIME: 16.9 s — AB (ref 11.4–15.2)

## 2018-03-07 LAB — AMMONIA: Ammonia: 28 umol/L (ref 9–35)

## 2018-03-07 MED ORDER — HYDROMORPHONE HCL 1 MG/ML IJ SOLN
1.0000 mg | Freq: Once | INTRAMUSCULAR | Status: AC
  Administered 2018-03-07: 1 mg via INTRAVENOUS
  Filled 2018-03-07: qty 1

## 2018-03-07 MED ORDER — SODIUM CHLORIDE 0.9 % IV BOLUS
500.0000 mL | Freq: Once | INTRAVENOUS | Status: AC
  Administered 2018-03-07: 500 mL via INTRAVENOUS

## 2018-03-07 MED ORDER — HYDROMORPHONE HCL 2 MG PO TABS: 2.0000 mg | ORAL_TABLET | Freq: Four times a day (QID) | ORAL | 0 refills | Status: DC | PRN
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
## 2018-03-07 NOTE — Discharge Instructions (Signed)
You were seen in the ED today with likely cancer-related pain and weakness. I am changing your pain medication. Stop taking the Oxycodone tablets for pain and you can begin taking the Dilaudid. Do not take this with your nightly Xanax. Call your PCP first thing on Monday to discuss the medication change. Return to the ED with any new or worsening symptoms.

## 2018-03-07 NOTE — ED Triage Notes (Signed)
Pt reports generalized pain and weakness from liver cancer.  Also reports diarrhea since yesterday.

## 2018-03-09 LAB — CULTURE, BODY FLUID-BOTTLE

## 2018-03-09 LAB — CULTURE, BODY FLUID W GRAM STAIN -BOTTLE: Culture: NO GROWTH

## 2018-03-10 ENCOUNTER — Other Ambulatory Visit: Payer: Self-pay | Admitting: Nurse Practitioner

## 2018-03-10 DIAGNOSIS — R188 Other ascites: Secondary | ICD-10-CM

## 2018-03-12 ENCOUNTER — Ambulatory Visit (HOSPITAL_COMMUNITY): Payer: Medicare Other

## 2018-03-13 ENCOUNTER — Ambulatory Visit (HOSPITAL_COMMUNITY)
Admission: RE | Admit: 2018-03-13 | Discharge: 2018-03-13 | Disposition: A | Discharge status: Home / Self Care | Payer: Medicare Other | Source: Ambulatory Visit | Attending: Nurse Practitioner | Admitting: Nurse Practitioner

## 2018-03-13 DIAGNOSIS — R188 Other ascites: Secondary | ICD-10-CM | POA: Diagnosis not present

## 2018-03-13 LAB — BODY FLUID CELL COUNT WITH DIFFERENTIAL
EOS FL: 0 %
LYMPHS FL: 37 %
Monocyte-Macrophage-Serous Fluid: 52 % (ref 50–90)
NEUTROPHIL FLUID: 11 % (ref 0–25)
OTHER CELLS FL: REACTIVE %
Total Nucleated Cell Count, Fluid: 138 cu mm (ref 0–1000)

## 2018-03-13 MED ORDER — ALBUMIN HUMAN 25 % IV SOLN
50.0000 g | Freq: Once | INTRAVENOUS | Status: AC
  Administered 2018-03-13: 50 g via INTRAVENOUS

## 2018-03-13 MED ORDER — ALBUMIN HUMAN 25 % IV SOLN
INTRAVENOUS | Status: AC
  Administered 2018-03-13: 50 g via INTRAVENOUS
  Filled 2018-03-13: qty 200

## 2018-03-13 NOTE — Sedation Documentation (Signed)
Patient denies pain and is resting comfortably.  

## 2018-03-13 NOTE — Sedation Documentation (Signed)
Vital signs stable. 

## 2018-03-16 LAB — PATHOLOGIST SMEAR REVIEW

## 2018-03-19 ENCOUNTER — Other Ambulatory Visit: Payer: Self-pay | Admitting: Nurse Practitioner

## 2018-03-19 DIAGNOSIS — R188 Other ascites: Secondary | ICD-10-CM

## 2018-03-20 ENCOUNTER — Ambulatory Visit (HOSPITAL_COMMUNITY)
Admission: RE | Admit: 2018-03-20 | Discharge: 2018-03-20 | Disposition: A | Discharge status: Home / Self Care | Payer: Medicare Other | Source: Ambulatory Visit | Attending: Nurse Practitioner | Admitting: Nurse Practitioner

## 2018-03-20 DIAGNOSIS — R188 Other ascites: Secondary | ICD-10-CM | POA: Insufficient documentation

## 2018-03-20 LAB — GRAM STAIN: GRAM STAIN: NONE SEEN

## 2018-03-20 LAB — BODY FLUID CELL COUNT WITH DIFFERENTIAL
EOS FL: 0 %
Lymphs, Fluid: 44 %
Monocyte-Macrophage-Serous Fluid: 45 % — ABNORMAL LOW (ref 50–90)
Neutrophil Count, Fluid: 11 % (ref 0–25)
Other Cells, Fluid: REACTIVE %
WBC FLUID: 173 uL (ref 0–1000)

## 2018-03-20 MED ORDER — ALBUMIN HUMAN 25 % IV SOLN
INTRAVENOUS | Status: AC
  Administered 2018-03-20: 50 g via INTRAVENOUS
  Filled 2018-03-20: qty 200

## 2018-03-20 MED ORDER — ALBUMIN HUMAN 25 % IV SOLN
50.0000 g | Freq: Once | INTRAVENOUS | Status: AC
  Administered 2018-03-20: 50 g via INTRAVENOUS

## 2018-03-20 NOTE — Procedures (Signed)
PreOperative Dx: Alcoholic cirrhosis, ascites, HCC Postoperative Dx: Alcoholic cirrhosis, ascites, HCC Procedure:   US guided paracentesis Radiologist:  Thornton Papas Anesthesia:  10 ml of1% lidocaine Specimen:  9.6 L of yellow ascitic fluid EBL:   < 1 ml Complications: None

## 2018-03-23 ENCOUNTER — Other Ambulatory Visit: Payer: Self-pay | Admitting: Nurse Practitioner

## 2018-03-23 DIAGNOSIS — R188 Other ascites: Secondary | ICD-10-CM

## 2018-03-23 LAB — PATHOLOGIST SMEAR REVIEW

## 2018-03-24 ENCOUNTER — Ambulatory Visit (HOSPITAL_COMMUNITY)
Admission: RE | Admit: 2018-03-24 | Discharge: 2018-03-24 | Disposition: A | Discharge status: Home / Self Care | Payer: Medicare Other | Source: Ambulatory Visit | Attending: Nurse Practitioner | Admitting: Nurse Practitioner

## 2018-03-24 ENCOUNTER — Encounter (HOSPITAL_COMMUNITY): Payer: Self-pay

## 2018-03-24 DIAGNOSIS — K7031 Alcoholic cirrhosis of liver with ascites: Secondary | ICD-10-CM | POA: Diagnosis not present

## 2018-03-24 DIAGNOSIS — C220 Liver cell carcinoma: Secondary | ICD-10-CM | POA: Insufficient documentation

## 2018-03-24 DIAGNOSIS — K7581 Nonalcoholic steatohepatitis (NASH): Secondary | ICD-10-CM | POA: Diagnosis not present

## 2018-03-24 DIAGNOSIS — R188 Other ascites: Secondary | ICD-10-CM

## 2018-03-24 LAB — BODY FLUID CELL COUNT WITH DIFFERENTIAL
EOS FL: 0 %
LYMPHS FL: 10 %
MONOCYTE-MACROPHAGE-SEROUS FLUID: 88 % (ref 50–90)
Neutrophil Count, Fluid: 2 % (ref 0–25)
OTHER CELLS FL: 0 %
WBC FLUID: 200 uL (ref 0–1000)

## 2018-03-24 LAB — GRAM STAIN: GRAM STAIN: NONE SEEN

## 2018-03-24 MED ORDER — ALBUMIN HUMAN 25 % IV SOLN
INTRAVENOUS | Status: AC
  Administered 2018-03-24: 50 g via INTRAVENOUS
  Filled 2018-03-24: qty 200

## 2018-03-24 MED ORDER — ALBUMIN HUMAN 25 % IV SOLN
50.0000 g | Freq: Once | INTRAVENOUS | Status: AC
  Administered 2018-03-24: 50 g via INTRAVENOUS

## 2018-03-24 NOTE — Procedures (Addendum)
PreOperative Dx: Alcoholic cirrhosis, ascites, HCC Postoperative Dx: Alcoholic cirrhosis, ascites, HCC Procedure:   US guided paracentesis Radiologist:  Thornton Papas Anesthesia:  10 ml of1% lidocaine Specimen:  8.4 L of clear yellow ascitic fluid EBL:   < 1 ml Complications: None

## 2018-03-24 NOTE — Progress Notes (Signed)
Paracentesis complete no signs of distress.  

## 2018-03-25 DIAGNOSIS — G894 Chronic pain syndrome: Secondary | ICD-10-CM | POA: Diagnosis not present

## 2018-03-25 DIAGNOSIS — E119 Type 2 diabetes mellitus without complications: Secondary | ICD-10-CM | POA: Diagnosis not present

## 2018-03-25 DIAGNOSIS — Z7982 Long term (current) use of aspirin: Secondary | ICD-10-CM | POA: Diagnosis not present

## 2018-03-25 DIAGNOSIS — K7469 Other cirrhosis of liver: Secondary | ICD-10-CM | POA: Diagnosis not present

## 2018-03-25 DIAGNOSIS — Z79899 Other long term (current) drug therapy: Secondary | ICD-10-CM | POA: Diagnosis not present

## 2018-03-25 DIAGNOSIS — C220 Liver cell carcinoma: Secondary | ICD-10-CM | POA: Diagnosis not present

## 2018-03-25 DIAGNOSIS — Z882 Allergy status to sulfonamides status: Secondary | ICD-10-CM | POA: Diagnosis not present

## 2018-03-25 DIAGNOSIS — R188 Other ascites: Secondary | ICD-10-CM | POA: Diagnosis not present

## 2018-03-25 DIAGNOSIS — Z96659 Presence of unspecified artificial knee joint: Secondary | ICD-10-CM | POA: Diagnosis not present

## 2018-03-25 DIAGNOSIS — I10 Essential (primary) hypertension: Secondary | ICD-10-CM | POA: Diagnosis not present

## 2018-03-25 LAB — CULTURE, BODY FLUID W GRAM STAIN -BOTTLE: Culture: NO GROWTH

## 2018-03-25 LAB — PATHOLOGIST SMEAR REVIEW

## 2018-03-28 DIAGNOSIS — C228 Malignant neoplasm of liver, primary, unspecified as to type: Secondary | ICD-10-CM | POA: Diagnosis not present

## 2018-03-28 DIAGNOSIS — R188 Other ascites: Secondary | ICD-10-CM | POA: Diagnosis not present

## 2018-03-28 DIAGNOSIS — K7460 Unspecified cirrhosis of liver: Secondary | ICD-10-CM | POA: Diagnosis not present

## 2018-03-28 DIAGNOSIS — Z79899 Other long term (current) drug therapy: Secondary | ICD-10-CM | POA: Diagnosis not present

## 2018-03-28 DIAGNOSIS — E119 Type 2 diabetes mellitus without complications: Secondary | ICD-10-CM | POA: Diagnosis not present

## 2018-03-28 DIAGNOSIS — L7634 Postprocedural seroma of skin and subcutaneous tissue following other procedure: Secondary | ICD-10-CM | POA: Diagnosis not present

## 2018-03-28 DIAGNOSIS — K219 Gastro-esophageal reflux disease without esophagitis: Secondary | ICD-10-CM | POA: Diagnosis not present

## 2018-03-28 DIAGNOSIS — I10 Essential (primary) hypertension: Secondary | ICD-10-CM | POA: Diagnosis not present

## 2018-03-28 DIAGNOSIS — E7800 Pure hypercholesterolemia, unspecified: Secondary | ICD-10-CM | POA: Diagnosis not present

## 2018-03-29 LAB — CULTURE, BODY FLUID W GRAM STAIN -BOTTLE: Culture: NO GROWTH

## 2018-03-30 DIAGNOSIS — Z79899 Other long term (current) drug therapy: Secondary | ICD-10-CM | POA: Diagnosis not present

## 2018-03-30 DIAGNOSIS — K219 Gastro-esophageal reflux disease without esophagitis: Secondary | ICD-10-CM | POA: Diagnosis not present

## 2018-03-30 DIAGNOSIS — I10 Essential (primary) hypertension: Secondary | ICD-10-CM | POA: Diagnosis not present

## 2018-03-30 DIAGNOSIS — C228 Malignant neoplasm of liver, primary, unspecified as to type: Secondary | ICD-10-CM | POA: Diagnosis not present

## 2018-03-30 DIAGNOSIS — L7634 Postprocedural seroma of skin and subcutaneous tissue following other procedure: Secondary | ICD-10-CM | POA: Diagnosis not present

## 2018-03-30 DIAGNOSIS — E7800 Pure hypercholesterolemia, unspecified: Secondary | ICD-10-CM | POA: Diagnosis not present

## 2018-03-30 DIAGNOSIS — R188 Other ascites: Secondary | ICD-10-CM | POA: Diagnosis not present

## 2018-03-30 DIAGNOSIS — E119 Type 2 diabetes mellitus without complications: Secondary | ICD-10-CM | POA: Diagnosis not present

## 2018-03-30 DIAGNOSIS — K7460 Unspecified cirrhosis of liver: Secondary | ICD-10-CM | POA: Diagnosis not present

## 2018-03-31 ENCOUNTER — Other Ambulatory Visit: Payer: Self-pay | Admitting: Nurse Practitioner

## 2018-03-31 ENCOUNTER — Ambulatory Visit (HOSPITAL_COMMUNITY)
Admission: RE | Admit: 2018-03-31 | Discharge: 2018-03-31 | Disposition: A | Discharge status: Home / Self Care | Payer: Medicare Other | Source: Ambulatory Visit | Attending: Nurse Practitioner | Admitting: Nurse Practitioner

## 2018-03-31 DIAGNOSIS — R188 Other ascites: Secondary | ICD-10-CM

## 2018-04-03 DIAGNOSIS — M25519 Pain in unspecified shoulder: Secondary | ICD-10-CM | POA: Diagnosis not present

## 2018-04-03 DIAGNOSIS — C229 Malignant neoplasm of liver, not specified as primary or secondary: Secondary | ICD-10-CM | POA: Diagnosis not present

## 2018-04-03 DIAGNOSIS — M545 Low back pain: Secondary | ICD-10-CM | POA: Diagnosis not present

## 2018-04-03 DIAGNOSIS — G894 Chronic pain syndrome: Secondary | ICD-10-CM | POA: Diagnosis not present

## 2018-04-06 ENCOUNTER — Other Ambulatory Visit: Payer: Self-pay | Admitting: Nurse Practitioner

## 2018-04-06 ENCOUNTER — Ambulatory Visit (HOSPITAL_COMMUNITY)
Admission: RE | Admit: 2018-04-06 | Discharge: 2018-04-06 | Disposition: A | Discharge status: Home / Self Care | Payer: Medicare Other | Source: Ambulatory Visit | Attending: Nurse Practitioner | Admitting: Nurse Practitioner

## 2018-04-06 ENCOUNTER — Encounter (HOSPITAL_COMMUNITY): Payer: Self-pay

## 2018-04-06 DIAGNOSIS — R188 Other ascites: Secondary | ICD-10-CM | POA: Insufficient documentation

## 2018-04-06 LAB — BODY FLUID CELL COUNT WITH DIFFERENTIAL
EOS FL: 0 %
Lymphs, Fluid: 7 %
Monocyte-Macrophage-Serous Fluid: 6 % — ABNORMAL LOW (ref 50–90)
NEUTROPHIL FLUID: 87 % — AB (ref 0–25)
OTHER CELLS FL: REACTIVE %
Total Nucleated Cell Count, Fluid: 178 cu mm (ref 0–1000)

## 2018-04-06 LAB — GRAM STAIN

## 2018-04-06 MED ORDER — ALBUMIN HUMAN 25 % IV SOLN
25.0000 g | Freq: Once | INTRAVENOUS | Status: AC
  Administered 2018-04-06: 25 g via INTRAVENOUS

## 2018-04-06 MED ORDER — ALBUMIN HUMAN 25 % IV SOLN
INTRAVENOUS | Status: AC
  Administered 2018-04-06: 25 g via INTRAVENOUS
  Filled 2018-04-06: qty 100

## 2018-04-06 NOTE — Progress Notes (Signed)
Paracentesis complete no signs of distress.  

## 2018-04-08 LAB — PATHOLOGIST SMEAR REVIEW

## 2018-04-11 LAB — CULTURE, BODY FLUID W GRAM STAIN -BOTTLE: Culture: NO GROWTH

## 2018-04-11 LAB — CULTURE, BODY FLUID-BOTTLE

## 2018-04-13 ENCOUNTER — Inpatient Hospital Stay (HOSPITAL_COMMUNITY)
Admission: EM | Admit: 2018-04-13 | Discharge: 2018-04-28 | DRG: 871 | Disposition: E | Payer: Medicare Other | Attending: Family Medicine | Admitting: Family Medicine

## 2018-04-13 ENCOUNTER — Other Ambulatory Visit: Payer: Self-pay

## 2018-04-13 ENCOUNTER — Inpatient Hospital Stay (HOSPITAL_COMMUNITY): Payer: Medicare Other

## 2018-04-13 ENCOUNTER — Encounter (HOSPITAL_COMMUNITY): Payer: Self-pay | Admitting: Emergency Medicine

## 2018-04-13 ENCOUNTER — Emergency Department (HOSPITAL_COMMUNITY): Payer: Medicare Other

## 2018-04-13 DIAGNOSIS — D72819 Decreased white blood cell count, unspecified: Secondary | ICD-10-CM | POA: Diagnosis present

## 2018-04-13 DIAGNOSIS — G8929 Other chronic pain: Secondary | ICD-10-CM | POA: Diagnosis present

## 2018-04-13 DIAGNOSIS — Z833 Family history of diabetes mellitus: Secondary | ICD-10-CM

## 2018-04-13 DIAGNOSIS — K219 Gastro-esophageal reflux disease without esophagitis: Secondary | ICD-10-CM | POA: Diagnosis present

## 2018-04-13 DIAGNOSIS — E785 Hyperlipidemia, unspecified: Secondary | ICD-10-CM | POA: Diagnosis present

## 2018-04-13 DIAGNOSIS — D708 Other neutropenia: Secondary | ICD-10-CM

## 2018-04-13 DIAGNOSIS — R531 Weakness: Secondary | ICD-10-CM

## 2018-04-13 DIAGNOSIS — K7469 Other cirrhosis of liver: Secondary | ICD-10-CM

## 2018-04-13 DIAGNOSIS — R188 Other ascites: Secondary | ICD-10-CM | POA: Diagnosis not present

## 2018-04-13 DIAGNOSIS — R000 Tachycardia, unspecified: Secondary | ICD-10-CM | POA: Diagnosis present

## 2018-04-13 DIAGNOSIS — Z888 Allergy status to other drugs, medicaments and biological substances status: Secondary | ICD-10-CM | POA: Diagnosis not present

## 2018-04-13 DIAGNOSIS — R6520 Severe sepsis without septic shock: Secondary | ICD-10-CM | POA: Diagnosis present

## 2018-04-13 DIAGNOSIS — R601 Generalized edema: Secondary | ICD-10-CM | POA: Diagnosis not present

## 2018-04-13 DIAGNOSIS — K652 Spontaneous bacterial peritonitis: Secondary | ICD-10-CM | POA: Diagnosis not present

## 2018-04-13 DIAGNOSIS — R0602 Shortness of breath: Secondary | ICD-10-CM | POA: Diagnosis not present

## 2018-04-13 DIAGNOSIS — F1010 Alcohol abuse, uncomplicated: Secondary | ICD-10-CM | POA: Diagnosis present

## 2018-04-13 DIAGNOSIS — F1120 Opioid dependence, uncomplicated: Secondary | ICD-10-CM | POA: Diagnosis present

## 2018-04-13 DIAGNOSIS — F17210 Nicotine dependence, cigarettes, uncomplicated: Secondary | ICD-10-CM | POA: Diagnosis present

## 2018-04-13 DIAGNOSIS — Z515 Encounter for palliative care: Secondary | ICD-10-CM | POA: Diagnosis not present

## 2018-04-13 DIAGNOSIS — A4150 Gram-negative sepsis, unspecified: Principal | ICD-10-CM | POA: Diagnosis present

## 2018-04-13 DIAGNOSIS — Z87898 Personal history of other specified conditions: Secondary | ICD-10-CM | POA: Diagnosis not present

## 2018-04-13 DIAGNOSIS — N179 Acute kidney failure, unspecified: Secondary | ICD-10-CM | POA: Diagnosis not present

## 2018-04-13 DIAGNOSIS — F1011 Alcohol abuse, in remission: Secondary | ICD-10-CM

## 2018-04-13 DIAGNOSIS — E119 Type 2 diabetes mellitus without complications: Secondary | ICD-10-CM | POA: Diagnosis present

## 2018-04-13 DIAGNOSIS — I959 Hypotension, unspecified: Secondary | ICD-10-CM | POA: Diagnosis present

## 2018-04-13 DIAGNOSIS — Z452 Encounter for adjustment and management of vascular access device: Secondary | ICD-10-CM | POA: Diagnosis not present

## 2018-04-13 DIAGNOSIS — E871 Hypo-osmolality and hyponatremia: Secondary | ICD-10-CM | POA: Diagnosis present

## 2018-04-13 DIAGNOSIS — E8770 Fluid overload, unspecified: Secondary | ICD-10-CM | POA: Diagnosis present

## 2018-04-13 DIAGNOSIS — T80212A Local infection due to central venous catheter, initial encounter: Secondary | ICD-10-CM

## 2018-04-13 DIAGNOSIS — A419 Sepsis, unspecified organism: Secondary | ICD-10-CM

## 2018-04-13 DIAGNOSIS — K7031 Alcoholic cirrhosis of liver with ascites: Secondary | ICD-10-CM | POA: Diagnosis present

## 2018-04-13 DIAGNOSIS — Z7189 Other specified counseling: Secondary | ICD-10-CM

## 2018-04-13 DIAGNOSIS — D696 Thrombocytopenia, unspecified: Secondary | ICD-10-CM | POA: Diagnosis present

## 2018-04-13 DIAGNOSIS — D61818 Other pancytopenia: Secondary | ICD-10-CM | POA: Diagnosis not present

## 2018-04-13 DIAGNOSIS — K7290 Hepatic failure, unspecified without coma: Secondary | ICD-10-CM | POA: Diagnosis present

## 2018-04-13 DIAGNOSIS — D65 Disseminated intravascular coagulation [defibrination syndrome]: Secondary | ICD-10-CM | POA: Diagnosis present

## 2018-04-13 DIAGNOSIS — K7011 Alcoholic hepatitis with ascites: Secondary | ICD-10-CM | POA: Diagnosis not present

## 2018-04-13 DIAGNOSIS — Z885 Allergy status to narcotic agent status: Secondary | ICD-10-CM

## 2018-04-13 DIAGNOSIS — Z96659 Presence of unspecified artificial knee joint: Secondary | ICD-10-CM | POA: Diagnosis present

## 2018-04-13 DIAGNOSIS — D649 Anemia, unspecified: Secondary | ICD-10-CM

## 2018-04-13 DIAGNOSIS — K766 Portal hypertension: Secondary | ICD-10-CM | POA: Diagnosis present

## 2018-04-13 DIAGNOSIS — K7460 Unspecified cirrhosis of liver: Secondary | ICD-10-CM | POA: Diagnosis present

## 2018-04-13 DIAGNOSIS — Z66 Do not resuscitate: Secondary | ICD-10-CM | POA: Diagnosis present

## 2018-04-13 DIAGNOSIS — C220 Liver cell carcinoma: Secondary | ICD-10-CM | POA: Diagnosis not present

## 2018-04-13 DIAGNOSIS — F17200 Nicotine dependence, unspecified, uncomplicated: Secondary | ICD-10-CM | POA: Diagnosis present

## 2018-04-13 DIAGNOSIS — Z8249 Family history of ischemic heart disease and other diseases of the circulatory system: Secondary | ICD-10-CM

## 2018-04-13 DIAGNOSIS — I10 Essential (primary) hypertension: Secondary | ICD-10-CM | POA: Diagnosis not present

## 2018-04-13 DIAGNOSIS — R0603 Acute respiratory distress: Secondary | ICD-10-CM | POA: Diagnosis not present

## 2018-04-13 DIAGNOSIS — Z79899 Other long term (current) drug therapy: Secondary | ICD-10-CM

## 2018-04-13 LAB — CBC
HCT: 27.5 % — ABNORMAL LOW (ref 39.0–52.0)
HEMOGLOBIN: 8.6 g/dL — AB (ref 13.0–17.0)
MCH: 27.2 pg (ref 26.0–34.0)
MCHC: 31.3 g/dL (ref 30.0–36.0)
MCV: 87 fL (ref 78.0–100.0)
Platelets: 46 10*3/uL — ABNORMAL LOW (ref 150–400)
RBC: 3.16 MIL/uL — ABNORMAL LOW (ref 4.22–5.81)
RDW: 19.1 % — AB (ref 11.5–15.5)
WBC: 8.4 10*3/uL (ref 4.0–10.5)

## 2018-04-13 LAB — COMPREHENSIVE METABOLIC PANEL
ALT: 35 U/L (ref 0–44)
AST: 71 U/L — AB (ref 15–41)
Albumin: 2.5 g/dL — ABNORMAL LOW (ref 3.5–5.0)
Alkaline Phosphatase: 260 U/L — ABNORMAL HIGH (ref 38–126)
Anion gap: 16 — ABNORMAL HIGH (ref 5–15)
BUN: 36 mg/dL — AB (ref 6–20)
CO2: 10 mmol/L — ABNORMAL LOW (ref 22–32)
Calcium: 8.3 mg/dL — ABNORMAL LOW (ref 8.9–10.3)
Chloride: 104 mmol/L (ref 98–111)
Creatinine, Ser: 2.09 mg/dL — ABNORMAL HIGH (ref 0.61–1.24)
GFR calc Af Amer: 39 mL/min — ABNORMAL LOW (ref >60)
GFR calc non Af Amer: 33 mL/min — ABNORMAL LOW (ref >60)
Glucose, Bld: 105 mg/dL — ABNORMAL HIGH (ref 70–99)
POTASSIUM: 4.3 mmol/L (ref 3.5–5.1)
Sodium: 130 mmol/L — ABNORMAL LOW (ref 135–145)
Total Bilirubin: 2.8 mg/dL — ABNORMAL HIGH (ref 0.3–1.2)
Total Protein: 5.4 g/dL — ABNORMAL LOW (ref 6.5–8.1)

## 2018-04-13 LAB — PROTIME-INR
INR: 2.94
INR: 3.64
PROTHROMBIN TIME: 30.4 s — AB (ref 11.4–15.2)
Prothrombin Time: 36 seconds — ABNORMAL HIGH (ref 11.4–15.2)

## 2018-04-13 LAB — TYPE AND SCREEN
ABO/RH(D): A POS
Antibody Screen: NEGATIVE

## 2018-04-13 LAB — GRAM STAIN

## 2018-04-13 LAB — CBC WITH DIFFERENTIAL/PLATELET
Basophils Absolute: 0 10*3/uL (ref 0.0–0.1)
Basophils Relative: 0 %
EOS ABS: 0.2 10*3/uL (ref 0.0–0.7)
Eosinophils Relative: 6 %
HCT: 29.8 % — ABNORMAL LOW (ref 39.0–52.0)
Hemoglobin: 9.6 g/dL — ABNORMAL LOW (ref 13.0–17.0)
LYMPHS ABS: 0.1 10*3/uL — AB (ref 0.7–4.0)
Lymphocytes Relative: 5 %
MCH: 27.4 pg (ref 26.0–34.0)
MCHC: 32.2 g/dL (ref 30.0–36.0)
MCV: 84.9 fL (ref 78.0–100.0)
MONO ABS: 0.1 10*3/uL (ref 0.1–1.0)
MONOS PCT: 2 %
Neutro Abs: 2.3 10*3/uL (ref 1.7–7.7)
Neutrophils Relative %: 87 %
PLATELETS: 74 10*3/uL — AB (ref 150–400)
RBC: 3.51 MIL/uL — AB (ref 4.22–5.81)
RDW: 18.8 % — AB (ref 11.5–15.5)
WBC: 2.7 10*3/uL — AB (ref 4.0–10.5)

## 2018-04-13 LAB — BASIC METABOLIC PANEL
ANION GAP: 16 — AB (ref 5–15)
BUN: 36 mg/dL — AB (ref 6–20)
CALCIUM: 7.5 mg/dL — AB (ref 8.9–10.3)
CHLORIDE: 105 mmol/L (ref 98–111)
CO2: 11 mmol/L — AB (ref 22–32)
CREATININE: 2.39 mg/dL — AB (ref 0.61–1.24)
GFR calc Af Amer: 33 mL/min — ABNORMAL LOW (ref >60)
GFR calc non Af Amer: 28 mL/min — ABNORMAL LOW (ref >60)
GLUCOSE: 47 mg/dL — AB (ref 70–99)
POTASSIUM: 4.1 mmol/L (ref 3.5–5.1)
Sodium: 132 mmol/L — ABNORMAL LOW (ref 135–145)

## 2018-04-13 LAB — BLOOD CULTURE ID PANEL (REFLEXED)
ACINETOBACTER BAUMANNII: NOT DETECTED
CANDIDA ALBICANS: NOT DETECTED
CANDIDA GLABRATA: NOT DETECTED
Candida krusei: NOT DETECTED
Candida parapsilosis: NOT DETECTED
Candida tropicalis: NOT DETECTED
Carbapenem resistance: NOT DETECTED
ENTEROBACTER CLOACAE COMPLEX: NOT DETECTED
ESCHERICHIA COLI: DETECTED — AB
Enterobacteriaceae species: DETECTED — AB
Enterococcus species: NOT DETECTED
Haemophilus influenzae: NOT DETECTED
KLEBSIELLA OXYTOCA: NOT DETECTED
Klebsiella pneumoniae: NOT DETECTED
LISTERIA MONOCYTOGENES: NOT DETECTED
Neisseria meningitidis: NOT DETECTED
PSEUDOMONAS AERUGINOSA: NOT DETECTED
Proteus species: NOT DETECTED
SERRATIA MARCESCENS: NOT DETECTED
STREPTOCOCCUS PNEUMONIAE: NOT DETECTED
STREPTOCOCCUS PYOGENES: NOT DETECTED
Staphylococcus aureus (BCID): NOT DETECTED
Staphylococcus species: NOT DETECTED
Streptococcus agalactiae: NOT DETECTED
Streptococcus species: NOT DETECTED

## 2018-04-13 LAB — BODY FLUID CELL COUNT WITH DIFFERENTIAL
Eos, Fluid: 0 %
LYMPHS FL: 5 %
Monocyte-Macrophage-Serous Fluid: 7 % — ABNORMAL LOW (ref 50–90)
NEUTROPHIL FLUID: 87 % — AB (ref 0–25)
WBC FLUID: 5629 uL — AB (ref 0–1000)

## 2018-04-13 LAB — BLOOD GAS, ARTERIAL
ACID-BASE DEFICIT: 16.2 mmol/L — AB (ref 0.0–2.0)
Bicarbonate: 12.1 mmol/L — ABNORMAL LOW (ref 20.0–28.0)
Drawn by: 2223
FIO2: 40
O2 Content: 4.5 L/min
O2 SAT: 94.6 %
PCO2 ART: 23.6 mmHg — AB (ref 32.0–48.0)
PH ART: 7.242 — AB (ref 7.350–7.450)
PO2 ART: 85.2 mmHg (ref 83.0–108.0)

## 2018-04-13 LAB — LACTIC ACID, PLASMA
Lactic Acid, Venous: 10.4 mmol/L (ref 0.5–1.9)
Lactic Acid, Venous: 9.6 mmol/L (ref 0.5–1.9)

## 2018-04-13 LAB — I-STAT CG4 LACTIC ACID, ED
LACTIC ACID, VENOUS: 9.66 mmol/L — AB (ref 0.5–1.9)
Lactic Acid, Venous: 8.94 mmol/L (ref 0.5–1.9)

## 2018-04-13 LAB — APTT: aPTT: 48 seconds — ABNORMAL HIGH (ref 24–36)

## 2018-04-13 LAB — PROCALCITONIN: Procalcitonin: 50.09 ng/mL

## 2018-04-13 MED ORDER — SODIUM CHLORIDE 0.9 % IV SOLN
2.0000 g | Freq: Two times a day (BID) | INTRAVENOUS | Status: DC
  Administered 2018-04-14: 2 g via INTRAVENOUS
  Filled 2018-04-13 (×5): qty 2

## 2018-04-13 MED ORDER — OXYCODONE HCL 5 MG PO TABS
10.0000 mg | ORAL_TABLET | Freq: Once | ORAL | Status: AC
  Administered 2018-04-13: 10 mg via ORAL
  Filled 2018-04-13: qty 2

## 2018-04-13 MED ORDER — NOREPINEPHRINE 4 MG/250ML-% IV SOLN
0.0000 ug/min | INTRAVENOUS | Status: DC
  Administered 2018-04-13: 2 ug/min via INTRAVENOUS
  Administered 2018-04-14 (×4): 32 ug/min via INTRAVENOUS
  Filled 2018-04-13 (×5): qty 250

## 2018-04-13 MED ORDER — SODIUM CHLORIDE 0.9% FLUSH
3.0000 mL | Freq: Two times a day (BID) | INTRAVENOUS | Status: DC
  Administered 2018-04-14: 3 mL via INTRAVENOUS

## 2018-04-13 MED ORDER — SODIUM CHLORIDE 0.9% FLUSH: 3.0000 mL | INTRAVENOUS | Status: DC | PRN

## 2018-04-13 MED ORDER — VANCOMYCIN HCL IN DEXTROSE 1-5 GM/200ML-% IV SOLN
1000.0000 mg | Freq: Once | INTRAVENOUS | Status: AC
  Administered 2018-04-13: 1000 mg via INTRAVENOUS
  Filled 2018-04-13: qty 200

## 2018-04-13 MED ORDER — VANCOMYCIN HCL IN DEXTROSE 750-5 MG/150ML-% IV SOLN
750.0000 mg | Freq: Two times a day (BID) | INTRAVENOUS | Status: DC
  Filled 2018-04-13 (×2): qty 150

## 2018-04-13 MED ORDER — SODIUM BICARBONATE 8.4 % IV SOLN
200.0000 meq | Freq: Once | INTRAVENOUS | Status: AC
  Administered 2018-04-13: 200 meq via INTRAVENOUS
  Filled 2018-04-13: qty 50

## 2018-04-13 MED ORDER — PANTOPRAZOLE SODIUM 40 MG PO TBEC
40.0000 mg | DELAYED_RELEASE_TABLET | Freq: Every day | ORAL | Status: DC
  Administered 2018-04-14: 40 mg via ORAL
  Filled 2018-04-13: qty 1

## 2018-04-13 MED ORDER — METRONIDAZOLE IN NACL 5-0.79 MG/ML-% IV SOLN
500.0000 mg | Freq: Three times a day (TID) | INTRAVENOUS | Status: DC
  Administered 2018-04-13 – 2018-04-14 (×3): 500 mg via INTRAVENOUS
  Filled 2018-04-13 (×3): qty 100

## 2018-04-13 MED ORDER — FOLIC ACID 1 MG PO TABS
1.0000 mg | ORAL_TABLET | Freq: Every day | ORAL | Status: DC
  Administered 2018-04-13: 1 mg via ORAL
  Filled 2018-04-13 (×2): qty 1

## 2018-04-13 MED ORDER — SODIUM BICARBONATE 8.4 % IV SOLN
INTRAVENOUS | Status: DC
  Administered 2018-04-13: 22:00:00 via INTRAVENOUS
  Filled 2018-04-13: qty 150

## 2018-04-13 MED ORDER — SODIUM CHLORIDE 0.9 % IV SOLN: 250.0000 mL | INTRAVENOUS | Status: DC | PRN

## 2018-04-13 MED ORDER — SODIUM BICARBONATE 8.4 % IV SOLN: 100.0000 meq | Freq: Once | INTRAVENOUS | Status: DC

## 2018-04-13 MED ORDER — ONDANSETRON HCL 4 MG PO TABS: 4.0000 mg | ORAL_TABLET | Freq: Four times a day (QID) | ORAL | Status: DC | PRN

## 2018-04-13 MED ORDER — ONDANSETRON HCL 4 MG/2ML IJ SOLN: 4.0000 mg | Freq: Four times a day (QID) | INTRAMUSCULAR | Status: DC | PRN

## 2018-04-13 MED ORDER — SODIUM CHLORIDE 0.9 % IV BOLUS
2000.0000 mL | Freq: Once | INTRAVENOUS | Status: AC
  Administered 2018-04-13: 2000 mL via INTRAVENOUS

## 2018-04-13 MED ORDER — VANCOMYCIN HCL IN DEXTROSE 750-5 MG/150ML-% IV SOLN
750.0000 mg | INTRAVENOUS | Status: DC
  Administered 2018-04-14: 750 mg via INTRAVENOUS
  Filled 2018-04-13: qty 150

## 2018-04-13 MED ORDER — SODIUM CHLORIDE 0.9 % IV SOLN: INTRAVENOUS | Status: DC

## 2018-04-13 MED ORDER — SODIUM CHLORIDE 0.9 % IV SOLN
2.0000 g | Freq: Once | INTRAVENOUS | Status: AC
  Administered 2018-04-13: 2 g via INTRAVENOUS
  Filled 2018-04-13: qty 2

## 2018-04-13 MED ORDER — PIPERACILLIN-TAZOBACTAM 3.375 G IVPB 30 MIN
3.3750 g | Freq: Once | INTRAVENOUS | Status: AC
  Administered 2018-04-13: 3.375 g via INTRAVENOUS
  Filled 2018-04-13: qty 50

## 2018-04-13 MED ORDER — OXYCODONE HCL 5 MG PO TABS
10.0000 mg | ORAL_TABLET | Freq: Three times a day (TID) | ORAL | Status: DC | PRN
  Administered 2018-04-13: 10 mg via ORAL
  Filled 2018-04-13: qty 2

## 2018-04-13 MED ORDER — OXYCODONE HCL 5 MG PO TABS
5.0000 mg | ORAL_TABLET | Freq: Once | ORAL | Status: AC
  Administered 2018-04-13: 5 mg via ORAL
  Filled 2018-04-13: qty 1

## 2018-04-13 MED ORDER — ALBUMIN HUMAN 25 % IV SOLN
12.5000 g | Freq: Once | INTRAVENOUS | Status: AC
  Administered 2018-04-13: 12.5 g via INTRAVENOUS
  Filled 2018-04-13: qty 50

## 2018-04-13 MED ORDER — MORPHINE SULFATE (PF) 2 MG/ML IV SOLN
1.0000 mg | INTRAVENOUS | Status: DC | PRN
  Administered 2018-04-14 (×5): 1 mg via INTRAVENOUS
  Filled 2018-04-13 (×7): qty 1

## 2018-04-13 MED ORDER — SODIUM BICARBONATE 8.4 % IV SOLN
INTRAVENOUS | Status: AC
  Filled 2018-04-13: qty 150

## 2018-04-13 MED ORDER — SODIUM CHLORIDE 0.9 % IV BOLUS
1000.0000 mL | Freq: Once | INTRAVENOUS | Status: AC
  Administered 2018-04-13: 1000 mL via INTRAVENOUS

## 2018-04-13 MED ORDER — PHENYLEPHRINE HCL-NACL 10-0.9 MG/250ML-% IV SOLN
INTRAVENOUS | Status: AC
  Filled 2018-04-13: qty 250

## 2018-04-13 MED ORDER — PHENYLEPHRINE HCL-NACL 10-0.9 MG/250ML-% IV SOLN
0.0000 ug/min | INTRAVENOUS | Status: DC
  Administered 2018-04-13: 13.3333 ug/min via INTRAVENOUS
  Administered 2018-04-14: 180 ug/min via INTRAVENOUS
  Administered 2018-04-14: 240 ug/min via INTRAVENOUS
  Filled 2018-04-13 (×2): qty 250
  Filled 2018-04-13: qty 500
  Filled 2018-04-13: qty 250

## 2018-04-13 MED ORDER — ADULT MULTIVITAMIN W/MINERALS CH
1.0000 | ORAL_TABLET | Freq: Every day | ORAL | Status: DC
  Administered 2018-04-13: 1 via ORAL
  Filled 2018-04-13 (×2): qty 1

## 2018-04-13 MED ORDER — VANCOMYCIN HCL IN DEXTROSE 1-5 GM/200ML-% IV SOLN: 1000.0000 mg | Freq: Once | INTRAVENOUS | Status: DC

## 2018-04-13 MED ORDER — ALBUMIN HUMAN 5 % IV SOLN
12.5000 g | Freq: Once | INTRAVENOUS | Status: DC
  Filled 2018-04-13: qty 250

## 2018-04-13 MED ORDER — SODIUM CHLORIDE 0.9 % IV BOLUS
500.0000 mL | Freq: Once | INTRAVENOUS | Status: AC
  Administered 2018-04-13: 500 mL via INTRAVENOUS

## 2018-04-13 MED ORDER — SODIUM CHLORIDE 0.9 % IV SOLN
INTRAVENOUS | Status: DC | PRN
  Administered 2018-04-13: 250 mL via INTRAVENOUS

## 2018-04-13 MED ORDER — VITAMIN B-1 100 MG PO TABS
100.0000 mg | ORAL_TABLET | Freq: Every day | ORAL | Status: DC
  Administered 2018-04-13: 100 mg via ORAL
  Filled 2018-04-13 (×2): qty 1

## 2018-04-13 MED ORDER — SODIUM CHLORIDE 0.9 % IV BOLUS
500.0000 mL | Freq: Once | INTRAVENOUS | Status: AC
  Administered 2018-04-13: 18:00:00 via INTRAVENOUS

## 2018-04-13 MED ORDER — HYDROCORTISONE NA SUCCINATE PF 100 MG IJ SOLR
100.0000 mg | Freq: Four times a day (QID) | INTRAMUSCULAR | Status: DC
  Administered 2018-04-13 – 2018-04-14 (×3): 100 mg via INTRAVENOUS
  Filled 2018-04-13 (×3): qty 2

## 2018-04-13 NOTE — ED Triage Notes (Signed)
Pt reports feeling like he cannot breathe and has been having increased swelling.  States he had a brief syncopal episode on the way to the hospital.  States he had a paracentesis about 4 days ago and swelling is worse than before it was done.

## 2018-04-13 NOTE — Progress Notes (Signed)
[facility] Progress Note Patient Name: Chris Alvarez DOB: [birth year] MRN: [id number]   Date of Service  03/29/2018  HPI/Events of Note  ABG on room air = 7.143/21.6/81.9/9.3. A D5 NaHCO3 IV infusion has been ordered to run at 75 mL/hour.   eICU Interventions  Will order: 1. NaHCO3 200 meq IV now.  2. Repeat ABG at 11 PM.     Intervention Category Major Interventions: Acid-Base disturbance - evaluation and management;Other:  Lysle Dingwall 04/11/2018, 9:22 PM

## 2018-04-13 NOTE — ED Notes (Signed)
CRITICAL VALUE ALERT  Critical Value:  Lactic acid 9.66  Date & Time Notied:  1129 04/27/2018 Provider Notified: Roderic Palau  Orders Received/Actions taken: see orders

## 2018-04-13 NOTE — Progress Notes (Addendum)
Patient continues to have ongoing hypotension for which pressors are required.  He will also require close monitoring of CVP.  Sodium bicarbonate has been administered with need for second pressor.  PICC line is medically necessary for administration of pressor agents as well as CVP monitoring.  Central venous line has been successfully placed as confirmed by imaging.  Extensive discussion held with family members in the waiting area with son now stating that he would like for patient to be DNR.  Multiple family members agree and orders have been placed.

## 2018-04-13 NOTE — Progress Notes (Signed)
[facility] Progress Note Patient Name: Chris Alvarez DOB: 1960-12-27 MRN: [id number]   Date of Service  04/05/2018  HPI/Events of Note  Multiple issues: 1. Gram Negative Rods on 2/2 blood cultures - Currently patient is on Cefepime which should cover GNRs, 2. Lactic Acid = 10.4 - likely d/t sepsis, hypotension and liver disease and 3. Hypotension - BP = 76/46 with MAP = 56. No CVL or CVP.  eICU Interventions  Will order: 1. ABG now. 2. Albumin 5% 250 mL IV now.  3. Phenylephrine IV infusion. Titrate to MAP >= 65.  4. Continue to trend lactic acid level. 5. Optimally, patient should have a CVL to monitor CVP and infuse vasoactive medications.      Intervention Category Major Interventions: Acid-Base disturbance - evaluation and management;Hypotension - evaluation and management  Lysle Dingwall 04/06/2018, 8:36 PM
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
## 2018-04-13 NOTE — ED Notes (Signed)
ED TO INPATIENT HANDOFF REPORT  Name/Age/Gender Chris Alvarez 57 y.o. male  Code Status Code Status History    Date Active Date Inactive Code Status Order ID Comments User Context   09/25/2017 0157 09/28/2017 1742 Full Code [id number]  Rodena Goldmann, DO ED   08/22/2016 2123 08/27/2016 1520 Full Code [id number]  Roney Jaffe, MD Inpatient      Home/SNF/Other Home  Chief Complaint NEAR SYNCOPE  Level of Care/Admitting Diagnosis ED Disposition    ED Disposition Condition [place]: [facility] [[id number]]  Level of Care: ICU [6]  Diagnosis: Decompensated liver disease [facility]) [[id number]]  Admitting Physician: Murlean Iba [[id number]]  Attending Physician: Murlean Iba [[id number]]  Estimated length of stay: past midnight tomorrow  Certification:: I certify this patient will need inpatient services for at least 2 midnights  PT Class (Do Not Modify): Inpatient [101]  PT Acc Code (Do Not Modify): Private [1]       Medical History Past Medical History:  Diagnosis Date  . Alcohol abuse   . Cancer ([place])   . Cirrhosis ([place])   . Depressed   . DM2 (diabetes mellitus, type 2) ([place])   . HLD (hyperlipidemia)   . HTN (hypertension)   . Other pancytopenia ([place]) 11/28/2016  . Thrombocytopenia ([place]) 08/23/2016    Allergies Allergies  Allergen Reactions  . Hydrocodone Bitartrate Hives  . Sulfa Antibiotics Other (See Comments)    Redness after going in sun  . Sulfur Rash    IV Location/Drains/Wounds Patient Lines/Drains/Airways Status   Active Line/Drains/Airways    Name:   Placement date:   Placement time:   Site:   Days:   Peripheral IV 03/31/2018 Right;Upper Arm   04/04/2018    1119    Arm   less than 1   Peripheral IV 04/10/2018 Left Forearm   03/31/2018    1120    Forearm   less than 1   Peripheral IV 04/11/2018 Right Hand   04/08/2018    1144    Hand   less than 1          Labs/Imaging Results for orders placed or performed during the hospital  encounter of 04/18/2018 (from the past 48 hour(s))  Comprehensive metabolic panel     Status: Abnormal   Collection Time: 04/08/2018 11:20 AM  Result Value Ref Range   Sodium 130 (L) 135 - 145 mmol/L   Potassium 4.3 3.5 - 5.1 mmol/L   Chloride 104 98 - 111 mmol/L   CO2 10 (L) 22 - 32 mmol/L   Glucose, Bld 105 (H) 70 - 99 mg/dL   BUN 36 (H) 6 - 20 mg/dL   Creatinine, Ser 2.09 (H) 0.61 - 1.24 mg/dL   Calcium 8.3 (L) 8.9 - 10.3 mg/dL   Total Protein 5.4 (L) 6.5 - 8.1 g/dL   Albumin 2.5 (L) 3.5 - 5.0 g/dL   AST 71 (H) 15 - 41 U/L   ALT 35 0 - 44 U/L   Alkaline Phosphatase 260 (H) 38 - 126 U/L   Total Bilirubin 2.8 (H) 0.3 - 1.2 mg/dL   GFR calc non Af Amer 33 (L) >60 mL/min   GFR calc Af Amer 39 (L) >60 mL/min    Comment: (NOTE) The eGFR has been calculated using the CKD EPI equation. This calculation has not been validated in all clinical situations. eGFR's persistently <60 mL/min signify possible Chronic Kidney Disease.    Anion gap 16 (H) 5 -  15    Comment: Performed at [facility], [address], [place], [place] [address]  CBC WITH DIFFERENTIAL     Status: Abnormal   Collection Time: 04/26/2018 11:20 AM  Result Value Ref Range   WBC 2.7 (L) 4.0 - 10.5 K/uL   RBC 3.51 (L) 4.22 - 5.81 MIL/uL   Hemoglobin 9.6 (L) 13.0 - 17.0 g/dL   HCT 29.8 (L) 39.0 - 52.0 %   MCV 84.9 78.0 - 100.0 fL   MCH 27.4 26.0 - 34.0 pg   MCHC 32.2 30.0 - 36.0 g/dL   RDW 18.8 (H) 11.5 - 15.5 %   Platelets 74 (L) 150 - 400 K/uL    Comment: SPECIMEN CHECKED FOR CLOTS CONSISTENT WITH PREVIOUS RESULT    Neutrophils Relative % 87 %   Lymphocytes Relative 5 %   Monocytes Relative 2 %   Eosinophils Relative 6 %   Basophils Relative 0 %   Neutro Abs 2.3 1.7 - 7.7 K/uL   Lymphs Abs 0.1 (L) 0.7 - 4.0 K/uL   Monocytes Absolute 0.1 0.1 - 1.0 K/uL   Eosinophils Absolute 0.2 0.0 - 0.7 K/uL   Basophils Absolute 0.0 0.0 - 0.1 K/uL   WBC Morphology VACUOLATED NEUTROPHILS     Comment: MEGAKARYOCYTE SEEN ATYPICAL  LYMPHOCYTES INCREASED BANDS (>20% BANDS) Performed at [facility], [address] [place], [place] [address] CORRECTED ON 09/16 AT 1311: PREVIOUSLY REPORTED AS VACUOLATED NEUTROPHILS MEGAKARYOCYTE SEEN   Blood Culture (routine x 2)     Status: None (Preliminary result)   Collection Time: 04/25/2018 11:20 AM  Result Value Ref Range   Specimen Description BLOOD RIGHT FOREARM DRAWN BY RN    Special Requests      BOTTLES DRAWN AEROBIC AND ANAEROBIC Blood Culture adequate volume Performed at [facility], [address] [place], [place] [address]    Culture PENDING    Report Status PENDING   Protime-INR     Status: Abnormal   Collection Time: 03/30/2018 11:20 AM  Result Value Ref Range   Prothrombin Time 30.4 (H) 11.4 - 15.2 seconds   INR 2.94     Comment: Performed at [facility], [address], [place], [place] [address]  I-Stat CG4 Lactic Acid, ED  (not at  [facility])     Status: Abnormal   Collection Time: 04/05/2018 11:27 AM  Result Value Ref Range   Lactic Acid, Venous 9.66 (HH) 0.5 - 1.9 mmol/L   Comment NOTIFIED PHYSICIAN   Blood Culture (routine x 2)     Status: None (Preliminary result)   Collection Time: 04/15/2018 11:28 AM  Result Value Ref Range   Specimen Description BLOOD RIGHT HAND DRAWN BY RN    Special Requests      BOTTLES DRAWN AEROBIC AND ANAEROBIC Blood Culture results may not be optimal due to an excessive volume of blood received in culture bottles Performed at [facility], [address] [place], [place] [address]    Culture PENDING    Report Status PENDING   I-Stat CG4 Lactic Acid, ED  (not at  [facility])     Status: Abnormal   Collection Time: 04/26/2018  1:31 PM  Result Value Ref Range   Lactic Acid, Venous 8.94 (HH) 0.5 - 1.9 mmol/L   Comment NOTIFIED PHYSICIAN    Dg Chest Portable 1 View  Result Date: 04/19/2018 CLINICAL DATA:  Shortness of breath. EXAM: PORTABLE CHEST 1 VIEW COMPARISON:  Radiographs of March 07, 2018. FINDINGS: The heart size and mediastinal  contours  are within normal limits. Both lungs are clear. No pneumothorax or pleural effusion is noted. The visualized skeletal structures are unremarkable. IMPRESSION: No acute cardiopulmonary abnormality seen. Electronically Signed   By: Marijo Conception, M.D.   On: 04/17/2018 11:44    Pending Labs Unresulted Labs (From admission, onward)    Start     Ordered   04/11/2018 1530  Lactic acid, plasma  STAT,   R     04/08/2018 1519   03/30/2018 1124  Urinalysis, Routine w reflex microscopic  STAT,   STAT     04/15/2018 1123          Vitals/Pain Today's Vitals   04/23/2018 1430 04/15/2018 1445 04/03/2018 1500 03/29/2018 1515  BP: 109/70 (!) 117/98 119/70 (!) 101/48  Pulse: (!) 125   (!) 124  Resp: (!) _0 (!) 22  Temp:      TempSrc:      SpO2: 97%   100%  Weight:      Height:      PainSc:        Isolation Precautions No active isolations  Medications Medications  0.9 %  sodium chloride infusion ( Intravenous Stopped 04/25/2018 1146)  0.9 %  sodium chloride infusion ( Intravenous Stopped 04/09/2018 1237)  oxyCODONE (Oxy IR/ROXICODONE) immediate release tablet 10 mg (has no administration in time range)  sodium chloride 0.9 % bolus 2,000 mL ( Intravenous Stopped 04/19/2018 1242)  vancomycin (VANCOCIN) IVPB 1000 mg/200 mL premix ( Intravenous Stopped 04/03/2018 1247)  piperacillin-tazobactam (ZOSYN) IVPB 3.375 g (0 g Intravenous Stopped 04/22/2018 1225)  sodium chloride 0.9 % bolus 1,000 mL (0 mLs Intravenous Stopped 04/04/2018 1345)  albumin human 25 % solution 12.5 g (0 g Intravenous Stopped 04/21/2018 1345)  sodium chloride 0.9 % bolus 500 mL (0 mLs Intravenous Stopped 04/24/2018 1506)  oxyCODONE (Oxy IR/ROXICODONE) immediate release tablet 5 mg (5 mg Oral Given 04/12/2018 1442)    Mobility walks with person assist

## 2018-04-13 NOTE — ED Notes (Signed)
Per MD Zammit give fluids at ordered rate.

## 2018-04-13 NOTE — ED Notes (Addendum)
Have notified Dr. Weber Cooks that pt's BP is 83/58. Stated to stop the procedure and notify Dr. Gala Romney.   Got 2.2 L of yellow discharge from RLQ. Pt in NAD.   Notified Dr. Gala Romney

## 2018-04-13 NOTE — Progress Notes (Signed)
Pharmacy Antibiotic Note  DRAVIN LANCE is a 57 y.o. male admitted on 04/21/2018 with sepsis.  Pharmacy has been consulted for cefepime and vancomycin dosing.  Plan: Vancomycin 750mg  IV every 12 hours.  Goal trough 15-20 mcg/mL. cefepime 2gm iv q12h  Height: 5\' 9"  (175.3 cm) Weight: 192 lb (87.1 kg) IBW/kg (Calculated) : 70.7  Temp (24hrs), Avg:97.5 F (36.4 C), Min:97.4 F (36.3 C), Max:97.5 F (36.4 C)  Recent Labs  Lab 04/23/2018 1120 04/03/2018 1127 03/31/2018 1331  WBC 2.7*  --   --   CREATININE 2.09*  --   --   LATICACIDVEN  --  9.66* 8.94*    Estimated Creatinine Clearance: 42.1 mL/min (A) (by C-G formula based on SCr of 2.09 mg/dL (H)).    Allergies  Allergen Reactions  . Hydrocodone Bitartrate Hives  . Sulfa Antibiotics Other (See Comments)    Redness after going in sun  . Sulfur Rash    Antimicrobials this admission: 9/16 cefepime >> 9/16 vancomycin >>  Microbiology results: 9/19 BCx: pending   Thank you for allowing pharmacy to be a part of this patient's care.  Donna Christen Taiz Bickle 04/13/2018 3:48 PM

## 2018-04-13 NOTE — ED Notes (Signed)
Starting BP 101/62.  BP after draining 2 Liters 91/52. NAD. Pt tolerating well
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
## 2018-04-14 ENCOUNTER — Inpatient Hospital Stay (HOSPITAL_COMMUNITY): Payer: Medicare Other

## 2018-04-14 DIAGNOSIS — Z87898 Personal history of other specified conditions: Secondary | ICD-10-CM

## 2018-04-14 DIAGNOSIS — E8770 Fluid overload, unspecified: Secondary | ICD-10-CM

## 2018-04-14 DIAGNOSIS — Z515 Encounter for palliative care: Secondary | ICD-10-CM

## 2018-04-14 DIAGNOSIS — Z7189 Other specified counseling: Secondary | ICD-10-CM

## 2018-04-14 LAB — COMPREHENSIVE METABOLIC PANEL
ALT: 32 U/L (ref 0–44)
AST: 98 U/L — AB (ref 15–41)
Albumin: 2 g/dL — ABNORMAL LOW (ref 3.5–5.0)
Alkaline Phosphatase: 144 U/L — ABNORMAL HIGH (ref 38–126)
Anion gap: 20 — ABNORMAL HIGH (ref 5–15)
BUN: 37 mg/dL — AB (ref 6–20)
CHLORIDE: 108 mmol/L (ref 98–111)
CO2: 8 mmol/L — ABNORMAL LOW (ref 22–32)
CREATININE: 2.55 mg/dL — AB (ref 0.61–1.24)
Calcium: 7.4 mg/dL — ABNORMAL LOW (ref 8.9–10.3)
GFR calc Af Amer: 30 mL/min — ABNORMAL LOW (ref >60)
GFR, EST NON AFRICAN AMERICAN: 26 mL/min — AB (ref >60)
Glucose, Bld: 62 mg/dL — ABNORMAL LOW (ref 70–99)
Potassium: 4.6 mmol/L (ref 3.5–5.1)
Sodium: 136 mmol/L (ref 135–145)
Total Bilirubin: 4.1 mg/dL — ABNORMAL HIGH (ref 0.3–1.2)
Total Protein: 4.4 g/dL — ABNORMAL LOW (ref 6.5–8.1)

## 2018-04-14 LAB — CBC WITH DIFFERENTIAL/PLATELET
Basophils Absolute: 0 10*3/uL (ref 0.0–0.1)
Basophils Relative: 0 %
Eosinophils Absolute: 0.1 10*3/uL (ref 0.0–0.7)
Eosinophils Relative: 1 %
HCT: 30 % — ABNORMAL LOW (ref 39.0–52.0)
HEMOGLOBIN: 9.4 g/dL — AB (ref 13.0–17.0)
Lymphocytes Relative: 5 %
Lymphs Abs: 0.6 10*3/uL (ref 0.7–4.0)
MCH: 27.4 pg (ref 26.0–34.0)
MCHC: 31.3 g/dL (ref 30.0–36.0)
MCV: 87.5 fL (ref 78.0–100.0)
MONOS PCT: 5 %
Monocytes Absolute: 0.6 10*3/uL (ref 0.1–1.0)
Neutro Abs: 11.7 10*3/uL (ref 1.7–7.7)
Neutrophils Relative %: 89 %
Platelets: ADEQUATE 10*3/uL (ref 150–400)
RBC: 3.43 MIL/uL — AB (ref 4.22–5.81)
RDW: 19.4 % — ABNORMAL HIGH (ref 11.5–15.5)
WBC: 13.1 10*3/uL — ABNORMAL HIGH (ref 4.0–10.5)

## 2018-04-14 LAB — MRSA PCR SCREENING: MRSA by PCR: POSITIVE — AB

## 2018-04-14 LAB — PROTIME-INR
INR: 4.65 — AB
Prothrombin Time: 43.5 seconds — ABNORMAL HIGH (ref 11.4–15.2)

## 2018-04-14 LAB — MAGNESIUM: MAGNESIUM: 1.5 mg/dL — AB (ref 1.7–2.4)

## 2018-04-14 LAB — PATHOLOGIST SMEAR REVIEW

## 2018-04-14 LAB — AMMONIA: AMMONIA: 86 umol/L — AB (ref 9–35)

## 2018-04-14 LAB — APTT: APTT: 53 s — AB (ref 24–36)

## 2018-04-14 MED ORDER — ATROPINE SULFATE 1 % OP SOLN
2.0000 [drp] | Freq: Four times a day (QID) | OPHTHALMIC | Status: DC | PRN
  Administered 2018-04-14: 2 [drp] via SUBLINGUAL
  Filled 2018-04-14: qty 2

## 2018-04-14 MED ORDER — PHENYLEPHRINE HCL 10 MG/ML IJ SOLN
INTRAMUSCULAR | Status: AC
  Filled 2018-04-14: qty 4

## 2018-04-14 MED ORDER — ACETYLCYSTEINE 20 % IN SOLN
RESPIRATORY_TRACT | Status: AC
  Administered 2018-04-14: 800 mg
  Filled 2018-04-14: qty 4

## 2018-04-14 MED ORDER — IPRATROPIUM-ALBUTEROL 0.5-2.5 (3) MG/3ML IN SOLN
3.0000 mL | Freq: Four times a day (QID) | RESPIRATORY_TRACT | Status: DC | PRN
  Administered 2018-04-14: 3 mL via RESPIRATORY_TRACT
  Filled 2018-04-14: qty 3

## 2018-04-14 MED ORDER — MUPIROCIN 2 % EX OINT
1.0000 "application " | TOPICAL_OINTMENT | Freq: Two times a day (BID) | CUTANEOUS | Status: DC
  Administered 2018-04-14 (×2): 1 via NASAL
  Filled 2018-04-14: qty 22

## 2018-04-14 MED ORDER — MORPHINE 100MG IN NS 100ML (1MG/ML) PREMIX INFUSION
2.0000 mg/h | INTRAVENOUS | Status: DC
  Administered 2018-04-14: 1 mg/h via INTRAVENOUS
  Administered 2018-04-14: 2 mg/h via INTRAVENOUS
  Administered 2018-04-14 (×2): 1 mg/h via INTRAVENOUS
  Filled 2018-04-14: qty 100

## 2018-04-14 MED ORDER — VANCOMYCIN HCL 10 G IV SOLR
1250.0000 mg | INTRAVENOUS | Status: DC
  Filled 2018-04-14: qty 1250

## 2018-04-14 MED ORDER — SODIUM CHLORIDE 0.9 % IV SOLN
1.0000 g | Freq: Two times a day (BID) | INTRAVENOUS | Status: DC
  Filled 2018-04-14 (×3): qty 1

## 2018-04-14 MED ORDER — ALBUMIN HUMAN 25 % IV SOLN: 150.0000 g | Freq: Once | INTRAVENOUS | Status: DC

## 2018-04-14 MED ORDER — ORAL CARE MOUTH RINSE
15.0000 mL | Freq: Two times a day (BID) | OROMUCOSAL | Status: DC
  Administered 2018-04-14 (×2): 15 mL via OROMUCOSAL

## 2018-04-14 MED ORDER — SODIUM CHLORIDE 0.9% FLUSH
10.0000 mL | Freq: Two times a day (BID) | INTRAVENOUS | Status: DC
  Administered 2018-04-14 (×2): 10 mL

## 2018-04-14 MED ORDER — SODIUM CHLORIDE 0.9% FLUSH: 10.0000 mL | INTRAVENOUS | Status: DC | PRN

## 2018-04-14 MED ORDER — CHLORHEXIDINE GLUCONATE CLOTH 2 % EX PADS
6.0000 | MEDICATED_PAD | Freq: Every day | CUTANEOUS | Status: DC
  Administered 2018-04-14: 6 via TOPICAL

## 2018-04-14 MED ORDER — ALBUMIN HUMAN 5 % IV SOLN: 100.0000 g | Freq: Once | INTRAVENOUS | Status: DC

## 2018-04-14 MED ORDER — ALBUTEROL SULFATE (2.5 MG/3ML) 0.083% IN NEBU
INHALATION_SOLUTION | RESPIRATORY_TRACT | Status: AC
  Administered 2018-04-14: 01:00:00 via RESPIRATORY_TRACT
  Filled 2018-04-14: qty 3

## 2018-04-14 MED ORDER — ACETYLCYSTEINE 20 % IN SOLN
4.0000 mL | Freq: Once | RESPIRATORY_TRACT | Status: DC
  Filled 2018-04-14: qty 4

## 2018-04-14 MED ORDER — POLYVINYL ALCOHOL 1.4 % OP SOLN: 2.0000 [drp] | OPHTHALMIC | Status: DC | PRN

## 2018-04-14 MED ORDER — SODIUM CHLORIDE 0.9 % IV SOLN
0.0000 ug/min | INTRAVENOUS | Status: DC
  Administered 2018-04-14 (×3): 240 ug/min via INTRAVENOUS
  Filled 2018-04-14 (×3): qty 4

## 2018-04-15 LAB — BLOOD GAS, ARTERIAL
Acid-base deficit: 20.2 mmol/L — ABNORMAL HIGH (ref 0.0–2.0)
BICARBONATE: 9.3 mmol/L — AB (ref 20.0–28.0)
Drawn by: 22223
FIO2: 28
O2 Saturation: 91.7 %
PH ART: 7.143 — AB (ref 7.350–7.450)
pCO2 arterial: 21.6 mmHg — ABNORMAL LOW (ref 32.0–48.0)
pO2, Arterial: 81.9 mmHg — ABNORMAL LOW (ref 83.0–108.0)

## 2018-04-15 LAB — CULTURE, BLOOD (ROUTINE X 2): SPECIAL REQUESTS: ADEQUATE

## 2018-04-18 LAB — CULTURE, BLOOD (ROUTINE X 2)
CULTURE: NO GROWTH
Culture: NO GROWTH
SPECIAL REQUESTS: ADEQUATE
Special Requests: ADEQUATE

## 2018-04-18 LAB — CULTURE, BODY FLUID-BOTTLE: CULTURE: NO GROWTH

## 2018-04-18 LAB — CULTURE, BODY FLUID W GRAM STAIN -BOTTLE

## 2018-04-28 NOTE — Progress Notes (Signed)
PROGRESS NOTE   Chris Alvarez  KZS:[id number]  DOB: [birth year]  DOA: 04/05/2018 PCP: Celene Squibb, MD   Brief Admission Hx: Chris Alvarez is a 57 y.o. male with history of longtime pain med abuse and is currently followed by [facility] hepatology/transplant for end-stage cirrhosis complicated by hepatocellular carcinoma. He was admitted with severe sepsis and decompensated cirrhosis.   MDM/Assessment & Plan:   1. Severe gram negative Sepsis - Pt is now on multiple pressor support, IV steroids, triple IV antibiotics.  His blood culture is positive for gram negative rods.  He is critically ill and his prognosis is grave.  Family requested that he be DNR and palliative medicine consultation is requested.   2. Decompensated liver failure - Pt is in fulminant liver failure.  His PT/INR is rising rapidly. Family leaning to comfort care.  Will obtain palliative care consultation.  IV morphine ordered.  Pt is too unstable to have a CT scan.   3. AKI - worsening renal failure despite IV fluids.  Pt is in multiorgan failure at this point.  4. Thrombocytopenia - suspect patient is starting to go into DIC.  5. Anasarca - unchanged.    Code Status: DNR Family Communication: son/ mother, sister Disposition Plan: remain in ICU  Consultants:  GI  Palliative care  Subjective: Pt remains critically ill  Objective: Vitals:   04/28/18 0400 04-28-2018 0415 2018/04/28 0430 April 28, 2018 0431  BP: 100/72 (!) 95/58 (!) 59/51 (!) 89/47  Pulse: (!) 116 (!) 115 (!) 115 (!) 114  Resp: (!) 32 (!) 32 (!) 32 (!) 31  Temp: (!) 95.7 F (35.4 C) (!) 95.7 F (35.4 C) (!) 95.7 F (35.4 C) (!) 95.7 F (35.4 C)  TempSrc:      SpO2:      Weight:      Height:        Intake/Output Summary (Last 24 hours) at April 28, 2018 0610 Last data filed at April 28, 2018 0438 Gross per 24 hour  Intake 7940.6 ml  Output 75 ml  Net 7865.6 ml   Filed Weights   03/29/2018 1105 03/30/2018 1129 04/19/2018 1730  Weight: 87.1 kg 87.1 kg  99.9 kg   REVIEW OF SYSTEMS  Unable to obtain as patient is critically ill   Exam:  General exam: pt appears more jaundiced and more ill.  Respiratory system: tachypnea, shallow breathing.   Cardiovascular system: S1 & S2 heard, tachycardic. Gastrointestinal system: Abdomen is grossly distended with large amount of ascites.  Central nervous system: nonfocal.  Extremities: 2+ pitting edema bilateral.   Data Reviewed: Basic Metabolic Panel: Recent Labs  Lab 03/31/2018 1120 04/03/2018 1747 04/28/2018 0358  NA 130* 132* 136  K 4.3 4.1 4.6  CL 104 105 108  CO2 10* 11* 8*  GLUCOSE 105* 47* 62*  BUN 36* 36* 37*  CREATININE 2.09* 2.39* 2.55*  CALCIUM 8.3* 7.5* 7.4*  MG  --   --  1.5*   Liver Function Tests: Recent Labs  Lab 03/30/2018 1120 2018/04/28 0358  AST 71* 98*  ALT 35 32  ALKPHOS 260* 144*  BILITOT 2.8* 4.1*  PROT 5.4* 4.4*  ALBUMIN 2.5* 2.0*   No results for input(s): LIPASE, AMYLASE in the last 168 hours. Recent Labs  Lab April 28, 2018 0358  AMMONIA 86*   CBC: Recent Labs  Lab 04/12/2018 1120 04/13/18 2249 04/28/2018 0358  WBC 2.7* 8.4 13.1*  NEUTROABS 2.3  --  11.7  HGB 9.6* 8.6* 9.4*  HCT 29.8* 27.5* 30.0*  MCV 84.9  87.0 87.5  PLT 74* 46* PLATELET CLUMPS NOTED ON SMEAR, COUNT APPEARS ADEQUATE   Cardiac Enzymes: No results for input(s): CKTOTAL, CKMB, CKMBINDEX, TROPONINI in the last 168 hours. CBG (last 3)  No results for input(s): GLUCAP in the last 72 hours. Recent Results (from the past 240 hour(s))  Culture, body fluid-bottle     Status: None   Collection Time: 04/06/18 11:30 AM  Result Value Ref Range Status   Specimen Description ASCITIC  Final   Special Requests 10CC BOTTLES DRAWN AEROBIC AND ANAEROBIC  Final   Culture   Final    NO GROWTH 5 DAYS Performed at [facility], [address], [place], [place] [address]    Report Status 04/11/2018 FINAL  Final  Gram stain     Status: None   Collection Time: 04/06/18 11:30 AM  Result Value Ref Range  Status   Specimen Description ASCITIC  Final   Special Requests 10CC  Final   Gram Stain   Final    WBC PRESENT,BOTH PMN AND MONONUCLEAR NO ORGANISMS SEEN CYTOSPIN SMEAR Performed at [facility], [address], [place], [place] [address]    Report Status 04/06/2018 FINAL  Final  Blood Culture (routine x 2)     Status: None (Preliminary result)   Collection Time: 04/16/2018 11:20 AM  Result Value Ref Range Status   Specimen Description   Final    BLOOD RIGHT FOREARM DRAWN BY RN Performed at [facility], [address] [place], [place] [address]    Special Requests   Final    BOTTLES DRAWN AEROBIC AND ANAEROBIC Blood Culture adequate volume Performed at [facility], [address] [place], [place] [address]    Culture  Setup Time   Final    GRAM NEGATIVE RODS ANAEROBIC AND AEROBIC BOTTLE Gram Stain Report Called to,Read Back By and Verified With: AMBURN,A ON 04/16/2018 AT 1950 BY LOY,C PERFORMED AT [facility] CRITICAL VALUE NOTED.  VALUE IS CONSISTENT WITH PREVIOUSLY REPORTED AND CALLED VALUE. Performed at [facility], [place] [address] [place], [place] [address]    Culture PENDING  Incomplete   Report Status PENDING  Incomplete  Blood Culture (routine x 2)     Status: None (Preliminary result)   Collection Time: 04/09/2018 11:28 AM  Result Value Ref Range Status   Specimen Description   Final    BLOOD RIGHT HAND DRAWN BY RN Performed at [facility], [address], [place], [place] [address]    Special Requests   Final    BOTTLES DRAWN AEROBIC AND ANAEROBIC Blood Culture results may not be optimal due to an excessive volume of blood received in culture bottles Performed at [facility], [address], [place], [place] [address]    Culture  Setup Time   Final    GRAM NEGATIVE RODS BOTH AEROBIC AND ANAEROBIC BOTTLES Gram Stain Report Called to,Read Back By and Verified With: AMBURN,A ON 04/23/2018 AT 1950 BY LOY,C PERFORMED AT [facility] Organism ID to follow CRITICAL RESULT CALLED TO,  READ BACK BY AND VERIFIED WITH: Bethann Humble RN 04/01/2018 2205 JDW Performed at [facility], [place] [address], [place],  [address]    Culture PENDING  Incomplete   Report Status PENDING  Incomplete  Blood Culture ID Panel (Reflexed)     Status: Abnormal   Collection Time: 04/05/2018 11:28 AM  Result Value Ref Range Status   Enterococcus species NOT DETECTED NOT DETECTED Final   Listeria monocytogenes NOT DETECTED NOT DETECTED Final  Staphylococcus species NOT DETECTED NOT DETECTED Final   Staphylococcus aureus NOT DETECTED NOT DETECTED Final   Streptococcus species NOT DETECTED NOT DETECTED Final   Streptococcus agalactiae NOT DETECTED NOT DETECTED Final   Streptococcus pneumoniae NOT DETECTED NOT DETECTED Final   Streptococcus pyogenes NOT DETECTED NOT DETECTED Final   Acinetobacter baumannii NOT DETECTED NOT DETECTED Final   Enterobacteriaceae species DETECTED (A) NOT DETECTED Final    Comment: Enterobacteriaceae represent a large family of gram-negative bacteria, not a single organism. CRITICAL RESULT CALLED TO, READ BACK BY AND VERIFIED WITH: Bethann Humble RN 04/07/2018 2205 JDW    Enterobacter cloacae complex NOT DETECTED NOT DETECTED Final   Escherichia coli DETECTED (A) NOT DETECTED Final    Comment: CRITICAL RESULT CALLED TO, READ BACK BY AND VERIFIED WITH: Bethann Humble RN 04/26/2018 2205 JDW    Klebsiella oxytoca NOT DETECTED NOT DETECTED Final   Klebsiella pneumoniae NOT DETECTED NOT DETECTED Final   Proteus species NOT DETECTED NOT DETECTED Final   Serratia marcescens NOT DETECTED NOT DETECTED Final   Carbapenem resistance NOT DETECTED NOT DETECTED Final   Haemophilus influenzae NOT DETECTED NOT DETECTED Final   Neisseria meningitidis NOT DETECTED NOT DETECTED Final   Pseudomonas aeruginosa NOT DETECTED NOT DETECTED Final   Candida albicans NOT DETECTED NOT DETECTED Final   Candida glabrata NOT DETECTED NOT DETECTED Final   Candida krusei NOT DETECTED NOT DETECTED Final   Candida  parapsilosis NOT DETECTED NOT DETECTED Final   Candida tropicalis NOT DETECTED NOT DETECTED Final    Comment: Performed at [facility], [place] [address], [place], [place] [address]  Culture, body fluid-bottle     Status: None (Preliminary result)   Collection Time: 04/07/2018  4:10 PM  Result Value Ref Range Status   Specimen Description ASCITIC  Final   Special Requests   Final    10CC Performed at [facility], [address] [place], [place] [address]    Culture PENDING  Incomplete   Report Status PENDING  Incomplete  Gram stain     Status: None   Collection Time: 04/07/2018  4:10 PM  Result Value Ref Range Status   Specimen Description ASCITIC  Final   Special Requests NONE  Final   Gram Stain   Final    Performed at [place] WBC PRESENT,BOTH PMN AND MONONUCLEAR Performed at [facility], [address], [place], [place] [address]    Report Status 04/09/2018 FINAL  Final  MRSA PCR Screening     Status: Abnormal   Collection Time: 04/17/2018  5:42 PM  Result Value Ref Range Status   MRSA by PCR POSITIVE (A) NEGATIVE Final    Comment: RESULT CALLED TO, READ BACK BY AND VERIFIED WITH: AMBURN,A. AT 0054 ON Apr 28, 2018 BY EVA        The GeneXpert MRSA Assay (FDA approved for NASAL specimens only), is one component of a comprehensive MRSA colonization surveillance program. It is not intended to diagnose MRSA infection nor to guide or monitor treatment for MRSA infections. Performed at [facility], [address], [place],  [address]   Culture, blood (x 2)     Status: None (Preliminary result)   Collection Time: 03/31/2018  5:46 PM  Result Value Ref Range Status   Specimen Description LEFT ANTECUBITAL  Final   Special Requests   Final    BOTTLES DRAWN AEROBIC AND ANAEROBIC Blood Culture adequate volume Performed at [facility], [address] [place], [place]  [address]    Culture PENDING  Incomplete   Report Status PENDING  Incomplete    Culture, blood (x 2)     Status: None (Preliminary result)   Collection Time: 04/05/2018  5:55 PM  Result Value Ref Range Status   Specimen Description BLOOD LEFT HAND  Final   Special Requests   Final    BOTTLES DRAWN AEROBIC AND ANAEROBIC Blood Culture adequate volume Performed at [facility], [address] [place], [place] [address]    Culture PENDING  Incomplete   Report Status PENDING  Incomplete     Studies: US Paracentesis  Result Date: 04/20/2018 INDICATION: 57 year old male with history of ascites. EXAM: ULTRASOUND GUIDED PARACENTESIS MEDICATIONS: None. COMPLICATIONS: None immediate. PROCEDURE: Informed written consent was obtained from the patient after a discussion of the risks, benefits and alternatives to treatment. A timeout was performed prior to the initiation of the procedure. Initial ultrasound scanning demonstrates a large amount of ascites within the right lower abdominal quadrant. The right lower abdomen was prepped and draped in the usual sterile fashion. 1% lidocaine with epinephrine was used for local anesthesia. Following this, a Yueh centesis catheter was introduced. An ultrasound image was saved for documentation purposes. The paracentesis was performed. The catheter was removed and a dressing was applied. The patient tolerated the procedure well without immediate post procedural complication. FINDINGS: A total of approximately 2.2 L of serous fluid was removed. Samples were sent to the laboratory as requested by the clinical team. IMPRESSION: Successful ultrasound-guided paracentesis yielding 2.2 liters of peritoneal fluid. Electronically Signed   By: Vinnie Langton M.D.   On: 03/31/2018 16:56   Dg Chest Portable 1 View  Result Date: 04/23/2018 CLINICAL DATA:  Shortness of breath. EXAM: PORTABLE CHEST 1 VIEW COMPARISON:  Radiographs of March 07, 2018. FINDINGS: The heart size and mediastinal contours are within normal limits. Both lungs are clear. No pneumothorax  or pleural effusion is noted. The visualized skeletal structures are unremarkable. IMPRESSION: No acute cardiopulmonary abnormality seen. Electronically Signed   By: Marijo Conception, M.D.   On: 04/05/2018 11:44   Dg Chest Port 1v Same Day  Result Date: 04/05/2018 CLINICAL DATA:  Central line placement EXAM: PORTABLE CHEST 1 VIEW COMPARISON:  04/12/2018, 03/07/2018 FINDINGS: Motion degradation. Right central venous catheter tip overlies the proximal right atrium. No pneumothorax. Low lung volumes. Borderline to mild cardiomegaly with vascular congestion and probable mild edema. Patchy bibasilar atelectasis. IMPRESSION: 1. Right central venous catheter tip overlies the proximal right atrium. No pneumothorax. 2. Low lung volumes. Borderline to mild cardiomegaly with vascular congestion and probable mild edema. Electronically Signed   By: Donavan Foil M.D.   On: 04/25/2018 22:55   Scheduled Meds: . acetylcysteine  4 mL Nebulization Once  . Chlorhexidine Gluconate Cloth  6 each Topical Daily  . folic acid  1 mg Oral Daily  . hydrocortisone sod succinate (SOLU-CORTEF) inj  100 mg Intravenous Q6H  . mouth rinse  15 mL Mouth Rinse BID  . multivitamin with minerals  1 tablet Oral Daily  . mupirocin ointment  1 application Nasal BID  . pantoprazole  40 mg Oral Q0600  . sodium chloride flush  10-40 mL Intracatheter Q12H  . sodium chloride flush  3 mL Intravenous Q12H  . thiamine  100 mg Oral Daily   Continuous Infusions: . sodium chloride    . ceFEPime (MAXIPIME) IV Stopped (04-19-18 0036)  . metronidazole Stopped (04/19/18 0132)  . norepinephrine (LEVOPHED) Adult infusion 32 mcg/min (Apr 19, 2018  0502)  . phenylephrine (NEO-SYNEPHRINE) Adult infusion 240 mcg/min (May 06, 2018 0514)  .  sodium bicarbonate  infusion 1000 mL 75 mL/hr at 05-06-18 0438  . vancomycin    . vancomycin 750 mg (06-May-2018 0500)    Principal Problem:   Decompensated liver disease (HCC) Active Problems:   Severe sepsis (HCC)    SBP (spontaneous bacterial peritonitis) (HCC)   TOBACCO ABUSE   Type 2 diabetes mellitus with hemoglobin A1c goal of less than 7.0% (HCC)   Tobacco dependence   Leukopenia   Thrombocytopenia (HCC)   Other pancytopenia (HCC)   Hepatic cirrhosis (HCC)   H/O ETOH abuse   GERD (gastroesophageal reflux disease)   Portal hypertension (HCC)   Ascites   Anasarca   Chronic anemia   Volume overload   Sinus tachycardia   Acute respiratory distress   Hepatocellular carcinoma (HCC)   Hypotension   AKI (acute kidney injury) ([place])   Hyponatremia  Critical Care Time spent: 22 minutes  Irwin Brakeman, MD, FAAFP [facility] Pager [phone]  If 7PM-7AM, please contact night-coverage [email or web address] Password [email or web address] 05-06-18, 6:10 AM    LOS: 1 day

## 2018-04-28 NOTE — Progress Notes (Signed)
2018/04/17 11:21 AM  I met with palliative medicine team.  Family decided to pursue full comfort care. Anticipating a hospital death.  Please see palliative medicine notes.   Murvin Natal MD

## 2018-04-28 NOTE — Progress Notes (Signed)
Attempted to suction patient and only got trace amounts of secretions. Patient tolerated until meeting resistance while suctioning patient. Patient remains on 4lpm [place] with saturations at 98%.

## 2018-04-28 NOTE — Progress Notes (Signed)
PHARMACY - PHYSICIAN COMMUNICATION CRITICAL VALUE ALERT - BLOOD CULTURE IDENTIFICATION (BCID)  Chris Alvarez is an 57 y.o. male who presented to [facility] on 04/23/2018   Assessment:  Patient's BCID shows e. coli   Name of physician (or Provider) Contacted: Dr Wynetta Emery  Current antibiotics: Cefepime/Flagyl/Vanco  Changes to prescribed antibiotics recommended:  Recommendations accepted by provider- Recommend change from Cefepime/Flagyl to [place] until sensitivities are back  Results for orders placed or performed during the hospital encounter of 04/09/2018  Blood Culture ID Panel (Reflexed) (Collected: 04/06/2018 11:28 AM)  Result Value Ref Range   Enterococcus species NOT DETECTED NOT DETECTED   Listeria monocytogenes NOT DETECTED NOT DETECTED   Staphylococcus species NOT DETECTED NOT DETECTED   Staphylococcus aureus NOT DETECTED NOT DETECTED   Streptococcus species NOT DETECTED NOT DETECTED   Streptococcus agalactiae NOT DETECTED NOT DETECTED   Streptococcus pneumoniae NOT DETECTED NOT DETECTED   Streptococcus pyogenes NOT DETECTED NOT DETECTED   Acinetobacter baumannii NOT DETECTED NOT DETECTED   Enterobacteriaceae species DETECTED (A) NOT DETECTED   Enterobacter cloacae complex NOT DETECTED NOT DETECTED   Escherichia coli DETECTED (A) NOT DETECTED   Klebsiella oxytoca NOT DETECTED NOT DETECTED   Klebsiella pneumoniae NOT DETECTED NOT DETECTED   Proteus species NOT DETECTED NOT DETECTED   Serratia marcescens NOT DETECTED NOT DETECTED   Carbapenem resistance NOT DETECTED NOT DETECTED   Haemophilus influenzae NOT DETECTED NOT DETECTED   Neisseria meningitidis NOT DETECTED NOT DETECTED   Pseudomonas aeruginosa NOT DETECTED NOT DETECTED   Candida albicans NOT DETECTED NOT DETECTED   Candida glabrata NOT DETECTED NOT DETECTED   Candida krusei NOT DETECTED NOT DETECTED   Candida parapsilosis NOT DETECTED NOT DETECTED   Candida tropicalis NOT DETECTED NOT DETECTED    Ramond Craver 04-21-2018  9:09 AM

## 2018-04-28 NOTE — Discharge Summary (Signed)
Expiration Note   Chris Alvarez  MR#: [id number]  DOB:1961/03/17  Date of Admission: 04/26/18 Date of Death: 27-Apr-2018  Time of death: Pt pronounced at 12:12 PM  Attending [place]  Patient's PCP: Celene Squibb, MD  Consults: Treatment Team:  Daneil Dolin, MD  Cause of Death: Decompensated liver disease [facility])  Secondary Diagnoses Present on Admission: . Decompensated liver disease ([place]) . Severe sepsis ([place]) . SBP (spontaneous bacterial peritonitis) ([place]) . TOBACCO ABUSE . Tobacco dependence . Sinus tachycardia . Acute respiratory distress . GERD (gastroesophageal reflux disease) . Thrombocytopenia ([place]) . Portal hypertension ([place]) . Hepatic cirrhosis ([place]) . Anasarca . Volume overload . Hepatocellular carcinoma ([place]) . Hypotension . AKI (acute kidney injury) ([place]) . Leukopenia . Chronic anemia . Ascites . Other pancytopenia ([place]) . Hyponatremia  The patient was admitted with decompensated liver failure, severe sepsis, gram-negative bacteremia and multiple other acute and chronic comorbidities.  The patient was severely dehydrated.  He had a large volume infected ascites and SBP.  He had a limited paracentesis where 2 L of purulent fluid was removed from the abdomen.  The patient was admitted to the intensive care unit and started on a sepsis protocol with rod spectrum antibiotic therapy, aggressive hydration and monitoring.  A central line was placed for pressor support as he became hypotensive.  Unfortunately despite these intensive therapies the patient was not having any meaningful clinical improvement.  At that time the family decided to make him DNR.  They eventually decided to pursue full comfort care measures after meeting with the palliative medicine team.  The patient had been started on full comfort care measures and he was pronounced dead at 12:12 PM on 2018/04/27.  SignedIrwin Brakeman MD  04/27/18, 12:21 PM

## 2018-04-28 NOTE — Progress Notes (Signed)
Attempted to give patient his PO meds whole, Pt was unable to swallow them whole.  Tried crushing the meds and putting them in applesauce, pt was unable to swallow the meds in the applesauce.

## 2018-04-28 NOTE — Consult Note (Signed)
Consultation Note Date: 04-30-2018   Patient Name: Chris Alvarez  DOB: Dec 13, 1960  MRN: [id number]  Age / Sex: 57 y.o., male  PCP: Celene Squibb, MD Referring Physician: Murlean Iba, MD  Reason for Consultation: Establishing goals of care and Psychosocial/spiritual support  HPI/Patient Profile: 57 y.o. male  with past medical history of ESLD, etoh abuse, HTN, HLD, DM2, HCC, current smoker depression admitted on 04/11/2018 with severe sepsis.   Clinical Assessment and Goals of Care: Mr. Chris Alvarez is lying quietly in bed.  He is surrounded by his son and daughter, Merrily Pew and [place].  He appears to be actively dying.   Family meeting in family waiting area with Merrily Pew, [place], Sister Chris Alvarez and her husband Chris Alvarez.  All family is in agreement for Chris Alvarez to be comfort measures only.  We talked about what this would look like, how to manage his symptoms.  Family states that his desire was for peace and dignity at end of life, they ask for restrictions of visitors.  We talked about continuous morphine infusion for pain and breathlessness.  Family is also agreeable to unburden Chris Alvarez from medications and treatments that are changing outcomes.  We talked about prognosis with permission.  I shared that I believe time is short.  Family states that Chris Alvarez mother is coming from the [place].  Conference with nursing staff related to comfort measures only, symptom management. Conference with GI NP related to comfort measures only. Conference with hospitalist related to comfort measures only, anticipated hospital death.  Orders updated, symptom management provided.  HCPOA  NEXT OF KIN son, Merrily Pew is primary Media planner.  He shares that he has discussed EOL wishes with his father and is his advocate.  Daughter Chris Alvarez is also at bedside.  Sisters Chris Alvarez and Chris Alvarez in family room.   Mother coming from [place].    SUMMARY OF RECOMMENDATIONS   Full comfort care  Code Status/Advance Care Planning:  DNR  Symptom Management:   Morphine continuous infusion.   symptom mgmnt.   Palliative Prophylaxis:   Frequent Pain Assessment and Turn Reposition  Additional Recommendations (Limitations, Scope, Preferences):  Full Comfort Care  Psycho-social/Spiritual:   Desire for further Chaplaincy support:no  Additional Recommendations: Caregiving  Support/Resources and Grief/Bereavement Support  Prognosis:   Hours - Days  Discharge Planning: Anticipated Hospital Death      Primary Diagnoses: Present on Admission: . Decompensated liver disease ([place]) . Severe sepsis ([place]) . SBP (spontaneous bacterial peritonitis) ([place]) . TOBACCO ABUSE . Tobacco dependence . Sinus tachycardia . Acute respiratory distress . GERD (gastroesophageal reflux disease) . Thrombocytopenia ([place]) . Portal hypertension ([place]) . Hepatic cirrhosis ([place]) . Anasarca . Volume overload . Hepatocellular carcinoma ([place]) . Hypotension . AKI (acute kidney injury) ([place]) . Leukopenia . Chronic anemia . Ascites . Other pancytopenia ([place]) . Hyponatremia   I have reviewed the medical record, interviewed the patient and family, and examined the patient. The following aspects are pertinent.  Past Medical History:  Diagnosis Date  . Alcohol  abuse   . Cancer ([place])   . Cirrhosis ([place])   . Depressed   . DM2 (diabetes mellitus, type 2) ([place])   . HLD (hyperlipidemia)   . HTN (hypertension)   . Other pancytopenia ([place]) 11/28/2016  . Thrombocytopenia ([place]) 08/23/2016   Social History   Socioeconomic History  . Marital status: Divorced    Spouse name: Not on file  . Number of children: Not on file  . Years of education: Not on file  . Highest education level: Not on file  Occupational History  . Not on file  Social Needs  . Financial resource strain: Not on file  . Food insecurity:     Worry: Not on file    Inability: Not on file  . Transportation needs:    Medical: Not on file    Non-medical: Not on file  Tobacco Use  . Smoking status: Current Every Day Smoker    Packs/day: 1.00    Years: 42.00    Pack years: 42.00    Types: Cigarettes  . Smokeless tobacco: Never Used  . Tobacco comment: quit 2011  Substance and Sexual Activity  . Alcohol use: Yes    Alcohol/week: 60.0 standard drinks    Types: 60 Cans of beer per week    Comment: stopped drinking 2 months ago (07/2017)  . Drug use: No    Comment: History of cocaine use- [facility] admission 2005.   Marland Kitchen Sexual activity: Not on file  Lifestyle  . Physical activity:    Days per week: Not on file    Minutes per session: Not on file  . Stress: Not on file  Relationships  . Social connections:    Talks on phone: Not on file    Gets together: Not on file    Attends religious service: Not on file    Active member of club or organization: Not on file    Attends meetings of clubs or organizations: Not on file    Relationship status: Not on file  Other Topics Concern  . 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    Family History  Problem Relation Age of Onset  . Hypertension Mother   . Heart Problems Mother   . Hyperlipidemia Mother   . Coronary artery disease Father   . Diabetes Father   . Gastric cancer Paternal Uncle        stomach  . Colon cancer Neg Hx   . Esophageal cancer Neg Hx    Scheduled Meds: . mouth rinse  15 mL Mouth Rinse BID   Continuous Infusions: . morphine 3 mg/hr (04/16/18 1140)   PRN Meds:.atropine, ipratropium-albuterol, ondansetron **OR** ondansetron (ZOFRAN) IV, polyvinyl alcohol, sodium chloride flush Medications Prior to Admission:  Prior to Admission medications   Medication Sig Start Date End Date Taking? Authorizing Provider  albuterol (PROAIR HFA) 108 (90 Base) MCG/ACT inhaler Inhale 2 puffs into the lungs every 4 (four) hours as needed for  wheezing or shortness of breath. 1-2 puffs every 4-6 hours prn    Yes [provider]  ALPRAZolam (XANAX) 1 MG tablet Take 1 mg by mouth at bedtime. *May take up to three times daily 09/08/17  Yes [provider]  fenofibrate 160 MG tablet Take 160 mg by mouth daily.     Yes [provider]  furosemide (LASIX) 40 MG tablet Take 1 tablet (40 mg total) by mouth 2 (two) times daily. Patient taking differently: Take 40 mg  by mouth 3 (three) times daily.  09/28/17  Yes Kathie Dike, MD  omeprazole (PRILOSEC) 20 MG capsule Take 20 mg by mouth daily.     Yes [provider]  oxyCODONE (ROXICODONE) 15 MG immediate release tablet Take 15 mg by mouth 3 (three) times daily as needed for pain.   Yes [provider]  spironolactone (ALDACTONE) 100 MG tablet Take 100 mg by mouth daily.   Yes [provider]  lactulose (CHRONULAC) 10 GM/15ML solution Take 30 mLs (20 g total) by mouth 2 (two) times daily. Patient not taking: Reported on 03/07/2018 09/28/17   Kathie Dike, MD   Allergies  Allergen Reactions  . Hydrocodone Bitartrate Hives  . Sulfa Antibiotics Other (See Comments)    Redness after going in sun  . Sulfur Rash   Review of Systems  Unable to perform ROS: Acuity of condition    Physical Exam  Constitutional:  Acutely/chronically ill, appears to be actively dying.   HENT:  Head: Atraumatic.  Cardiovascular: Normal rate.  Pulmonary/Chest: Effort normal. No respiratory distress.  slight WOB noted.   Abdominal: He exhibits distension.  Firm, swollen abd.   Musculoskeletal: He exhibits edema.  Neurological:  Opens eyes at times.  Will ask for sips of liquids. Orientation questions not asked.   Psychiatric:  Appears to be actively dying.   Vitals reviewed.   Vital Signs: BP 90/66   Pulse (!) 110   Temp (!) 95.2 F (35.1 C)   Resp (!) 35   Ht 5\' 10"  (1.778 m)   Wt 105.7 kg   SpO2 97%   BMI 33.44 kg/m  Pain Scale: CPOT POSS  *See Group Information*: S-Acceptable,Sleep, easy to arouse Pain Score: 6    SpO2: SpO2: 97 % O2 Device:SpO2: 97 % O2 Flow Rate: .O2 Flow Rate (L/min): 4 L/min  IO: Intake/output summary:   Intake/Output Summary (Last 24 hours) at 25-Apr-2018 1219 Last data filed at 04-25-18 9604 Gross per 24 hour  Intake 8652.96 ml  Output 75 ml  Net 8577.96 ml    LBM: Last BM Date: 2018-04-25 Baseline Weight: Weight: 87.1 kg Most recent weight: Weight: 105.7 kg     Palliative Assessment/Data:     Time In: 0900 Time Out: 1020  Time Total: 80 minutes Greater than 50%  of this time was spent counseling and coordinating care related to the above assessment and plan.  Signed by: Drue Novel, NP   Please contact Palliative Medicine Team phone at [phone] for questions and concerns.  For individual provider: See Shea Evans

## 2018-04-28 NOTE — Progress Notes (Signed)
The rest of the morphine drip, about 30 mL, was wasted in the sink. Witnessed by Cyndi Bender, RN.

## 2018-04-28 NOTE — Progress Notes (Cosign Needed)
Subjective: Son at bedside. Son states he seems to be in less pain currently. Still tachycardic, hypotensive. Turns his head to me but acutely ill. Ongoing hypotension overnight with multiple pressors. DNR.   Objective: Vital signs in last 24 hours: Temp:  [94.5 F (34.7 C)-97.5 F (36.4 C)] 95.5 F (35.3 C) (09/17 5643) Pulse Rate:  [99-127] 113 (09/17 0633) Resp:  [17-39] 34 (09/17 0633) BP: (42-201)/(30-167) 101/70 (09/17 0630) SpO2:  [95 %-100 %] 97 % (09/17 0058) Weight:  [87.1 kg-105.7 kg] 105.7 kg (09/17 0500) Last BM Date: Apr 15, 2018 General:   Acutely ill, no discomfort noted  Abdomen:  Bowel sounds present, distended, TTP with just light palpation Extremities:  Diffuse anasarca, mottling of lower extremities  Neurologic:  Eyes closed but tries to open to verbal stimuli. Unable to assess orientation    Intake/Output from previous day: 09/16 0701 - 09/17 0700 In: 8640 [I.V.:3420.9; IV Piggyback:5219] Out: 75 [Urine:75] Intake/Output this shift: Total I/O In: 13 [I.V.:13] Out: -   Lab Results: Recent Labs    04/19/2018 1120 04/06/2018 2249 Apr 15, 2018 0358  WBC 2.7* 8.4 13.1*  HGB 9.6* 8.6* 9.4*  HCT 29.8* 27.5* 30.0*  PLT 74* 46* PLATELET CLUMPS NOTED ON SMEAR, COUNT APPEARS ADEQUATE   BMET Recent Labs    03/30/2018 1120 04/02/2018 1747 04/15/18 0358  NA 130* 132* 136  K 4.3 4.1 4.6  CL 104 105 108  CO2 10* 11* 8*  GLUCOSE 105* 47* 62*  BUN 36* 36* 37*  CREATININE 2.09* 2.39* 2.55*  CALCIUM 8.3* 7.5* 7.4*   LFT Recent Labs    04/04/2018 1120 Apr 15, 2018 0358  PROT 5.4* 4.4*  ALBUMIN 2.5* 2.0*  AST 71* 98*  ALT 35 32  ALKPHOS 260* 144*  BILITOT 2.8* 4.1*   PT/INR Recent Labs    04/16/2018 1747 04/15/2018 0358  LABPROT 36.0* 43.5*  INR 3.64 4.65*     Studies/Results: US Paracentesis  Result Date: 04/20/2018 INDICATION: 57 year old male with history of ascites. EXAM: ULTRASOUND GUIDED PARACENTESIS MEDICATIONS: None. COMPLICATIONS: None immediate.  PROCEDURE: Informed written consent was obtained from the patient after a discussion of the risks, benefits and alternatives to treatment. A timeout was performed prior to the initiation of the procedure. Initial ultrasound scanning demonstrates a large amount of ascites within the right lower abdominal quadrant. The right lower abdomen was prepped and draped in the usual sterile fashion. 1% lidocaine with epinephrine was used for local anesthesia. Following this, a Yueh centesis catheter was introduced. An ultrasound image was saved for documentation purposes. The paracentesis was performed. The catheter was removed and a dressing was applied. The patient tolerated the procedure well without immediate post procedural complication. FINDINGS: A total of approximately 2.2 L of serous fluid was removed. Samples were sent to the laboratory as requested by the clinical team. IMPRESSION: Successful ultrasound-guided paracentesis yielding 2.2 liters of peritoneal fluid. Electronically Signed   By: Vinnie Langton M.D.   On: 04/23/2018 16:56   Dg Chest Portable 1 View  Result Date: 04/12/2018 CLINICAL DATA:  Shortness of breath. EXAM: PORTABLE CHEST 1 VIEW COMPARISON:  Radiographs of March 07, 2018. FINDINGS: The heart size and mediastinal contours are within normal limits. Both lungs are clear. No pneumothorax or pleural effusion is noted. The visualized skeletal structures are unremarkable. IMPRESSION: No acute cardiopulmonary abnormality seen. Electronically Signed   By: Marijo Conception, M.D.   On: 04/12/2018 11:44   Dg Chest Port 1v Same Day  Result Date: 04/12/2018 CLINICAL DATA:  Central line placement EXAM: PORTABLE CHEST 1 VIEW COMPARISON:  04/27/2018, 03/07/2018 FINDINGS: Motion degradation. Right central venous catheter tip overlies the proximal right atrium. No pneumothorax. Low lung volumes. Borderline to mild cardiomegaly with vascular congestion and probable mild edema. Patchy bibasilar atelectasis.  IMPRESSION: 1. Right central venous catheter tip overlies the proximal right atrium. No pneumothorax. 2. Low lung volumes. Borderline to mild cardiomegaly with vascular congestion and probable mild edema. Electronically Signed   By: Donavan Foil M.D.   On: 04/05/2018 22:55    Assessment/Plan: 57 year old male with history of ETOH cirrhosis (MELD Na 38), multiple paras in past and recently found to have [facility] s/p ablation on 8/28 presenting with recurrent tense ascites, anasarca, abdominal pain. Fluid analysis with SBP,  blood culture ID panel with E.coli. On multiple pressor support, triple IV antibiotics and pharmacy following. Now DNR and palliative consultation has been requested. He was not stable for CT abd/pelvis yesterday, and at this point is not necessary due to grim prognosis and unfortunately multiorgan failure. Son at bedside and fully aware of prognosis, wanting him to remain comfortable. No further recommendations from a GI perspective that would likely change course in setting of end-stage disease. Agree with comfort measures for this nice gentleman.    Annitta Needs, PhD, ANP-BC [facility]     LOS: 1 day    04-20-2018, 9:12 AM

## 2018-04-28 DEATH — deceased

## 2019-09-11 IMAGING — CT CT BIOPSY AND ASPIRATION BONE MARROW
1 of 2 series · 15 of 28 positions shown, 19 images · non-contrast
Comparison: none

INDICATION: 56-year-old with pancytopenia.  Request for bone marrow biopsy.

[Series 2: i-spiral 5.0 b40f · axial · 0.98mm/px · z∈[+1028,+1105]mm · 15 of 26 slices shown, 19 images]
[im 2/26  mediastinal]
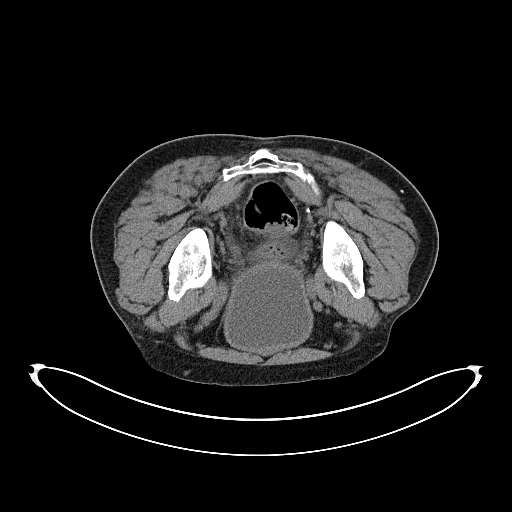
[im 2/26  lung]
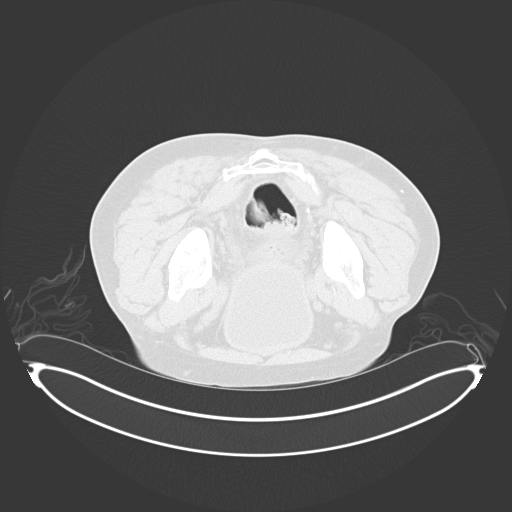
[im 4/26  lung]
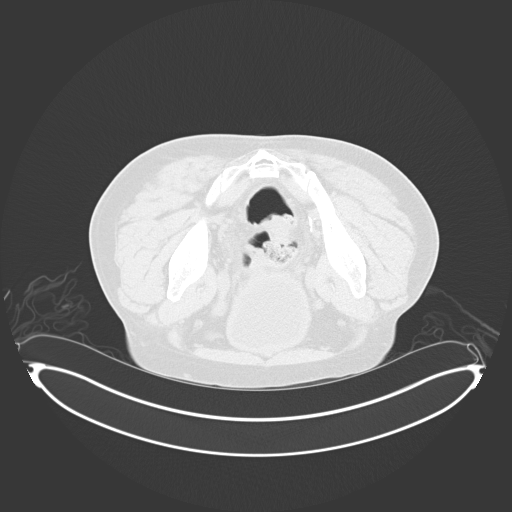
[im 5/26  lung]
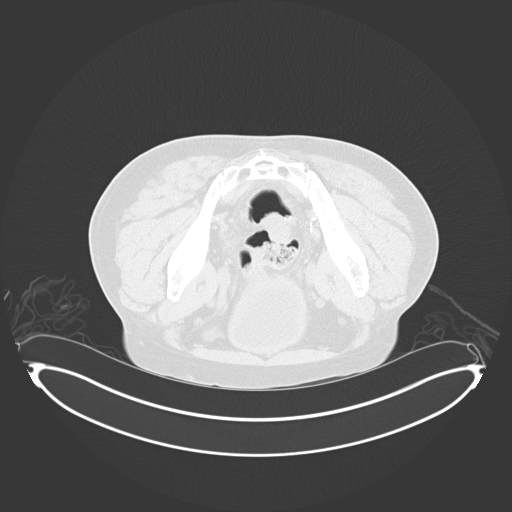
[im 6/26  lung]
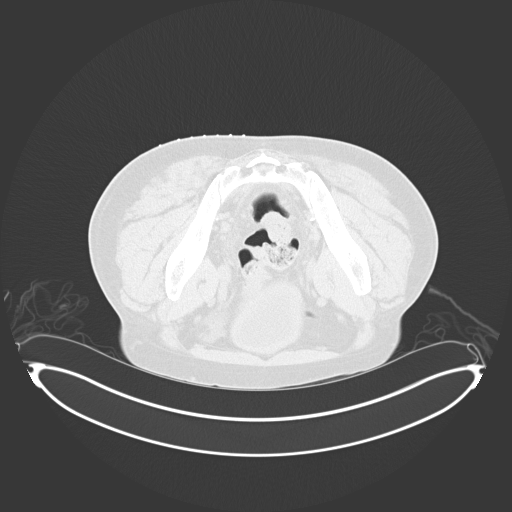
[im 8/26  mediastinal]
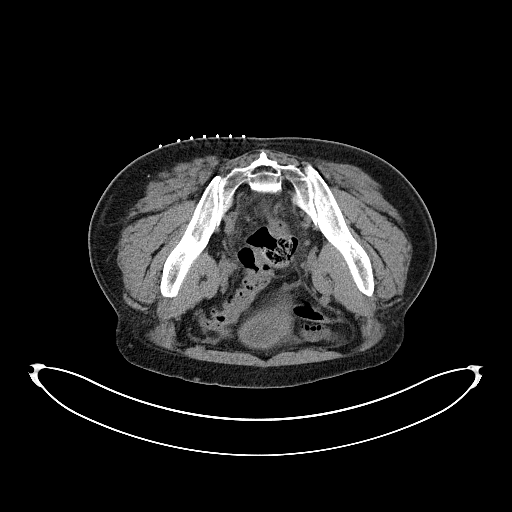
[im 8/26  lung]
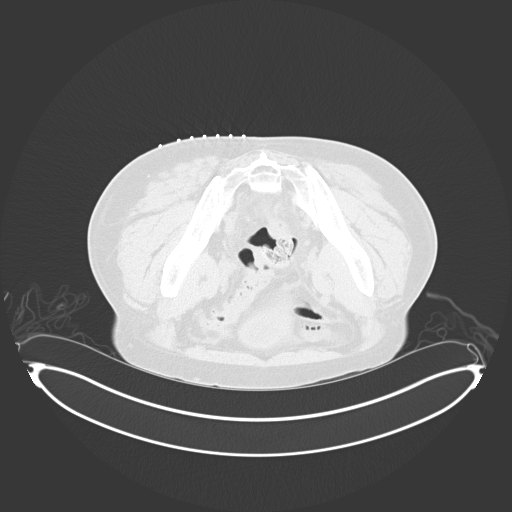
[im 10/26  lung]
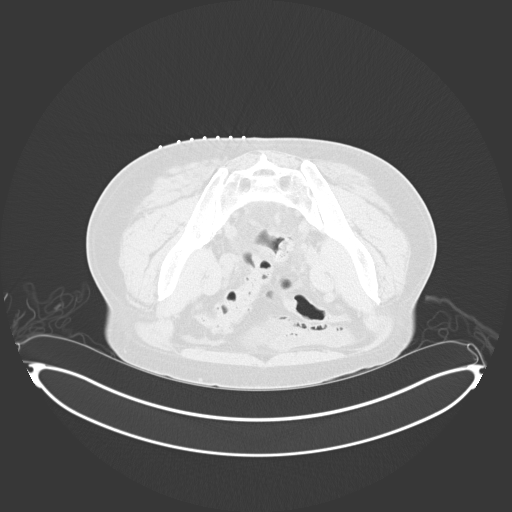
[im 12/26  lung]
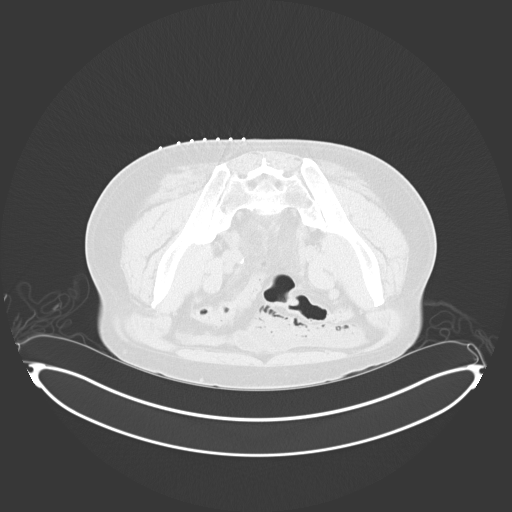
[im 13/26  lung]
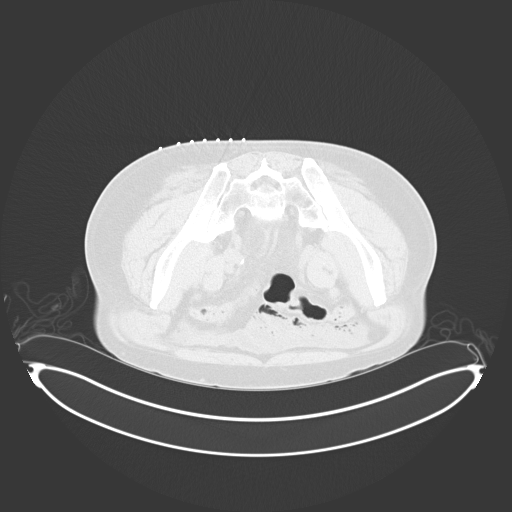
[im 14/26  mediastinal]
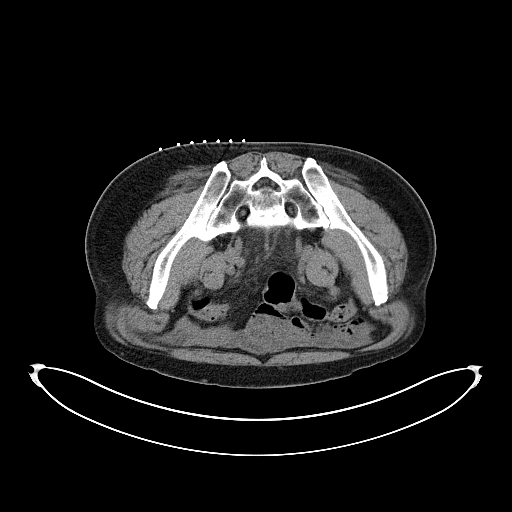
[im 14/26  lung]
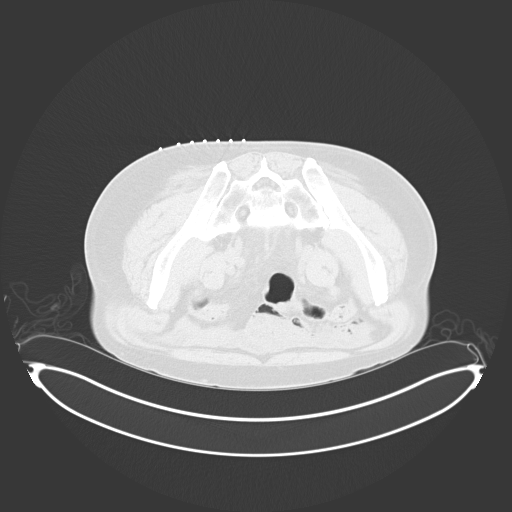
[im 16/26  lung]
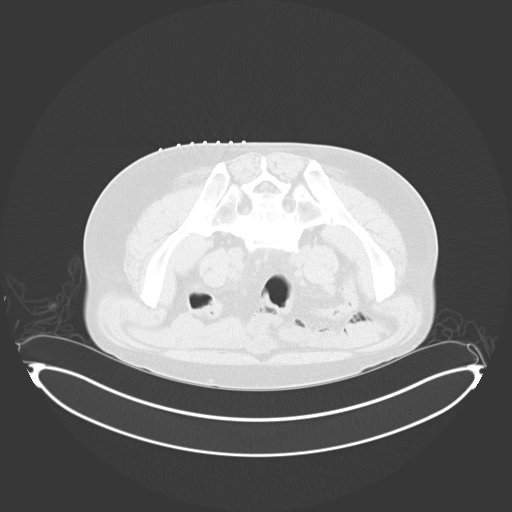
[im 18/26  lung]
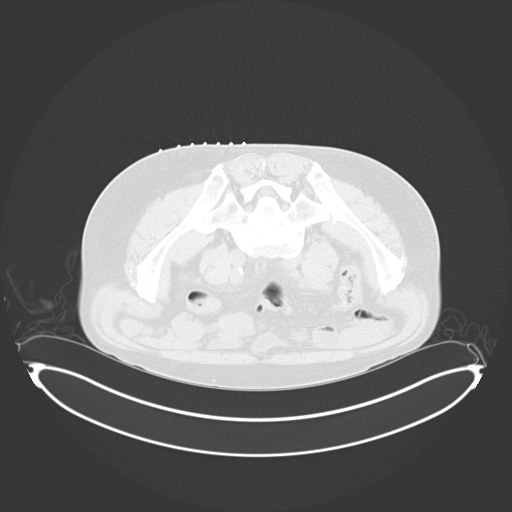
[im 20/26  lung]
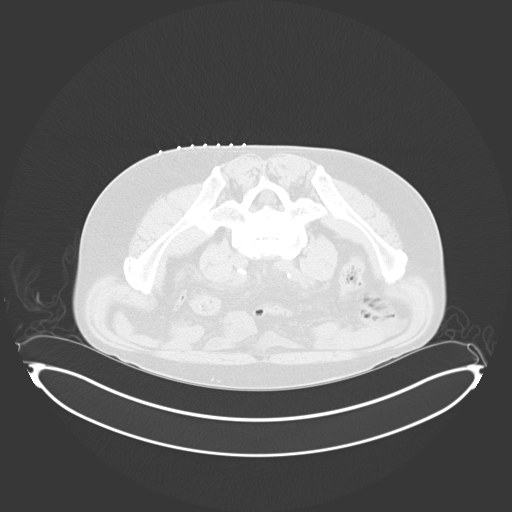
[im 21/26  mediastinal]
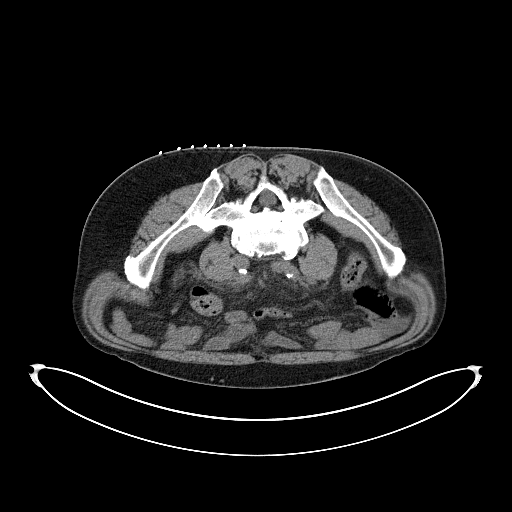
[im 21/26  lung]
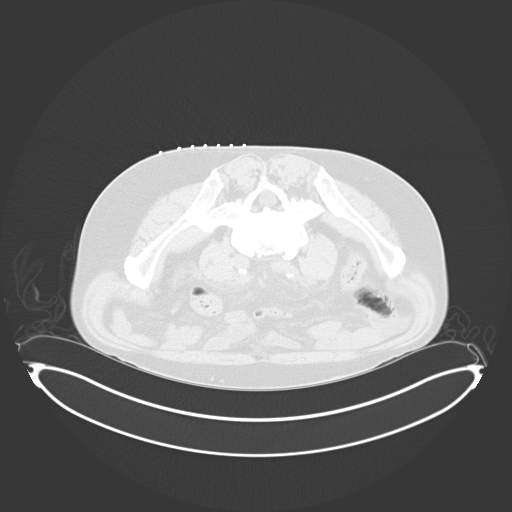
[im 22/26  lung]
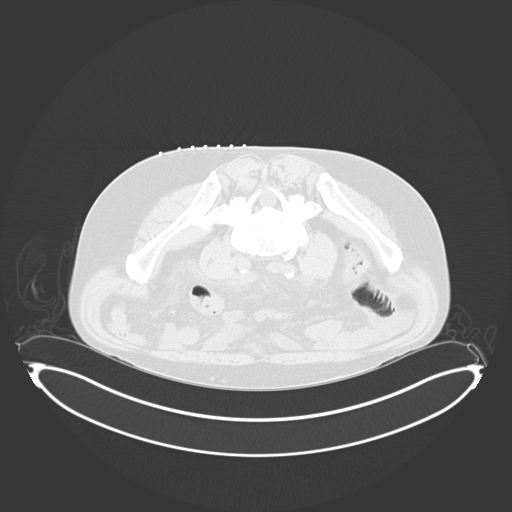
[im 24/26  lung]
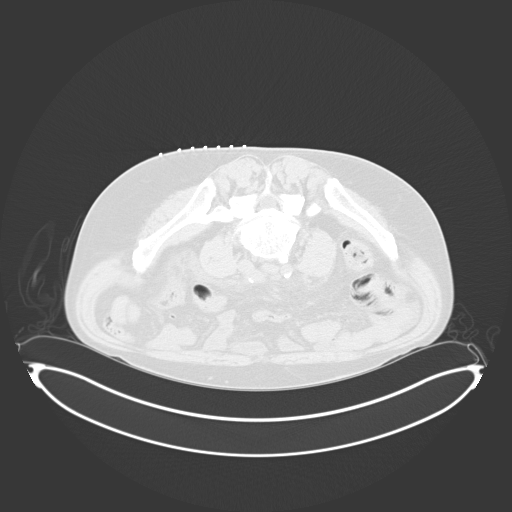

[15 of 28 positions shown; findings below may reference images not displayed]

EXAM:
CT GUIDED BONE MARROW ASPIRATES AND BIOPSY

MEDICATIONS:
None.

ANESTHESIA/SEDATION:
Fentanyl 100 mcg IV; Versed 2.0 mg IV

Moderate Sedation Time:  19 minutes

The patient was continuously monitored during the procedure by the
interventional radiology nurse under my direct supervision.

COMPLICATIONS:
None immediate.

PROCEDURE:
The procedure was explained to the patient. The risks and benefits
of the procedure were discussed and the patient's questions were
addressed. Informed consent was obtained from the patient. The
patient was placed prone on CT scan. Images of the pelvis were
obtained. The right side of back was prepped and draped in sterile
fashion. The skin and right posterior iliac bone were anesthetized
with 1% lidocaine. 11 gauge bone needle was directed into the right
iliac bone with CT guidance. Two aspirates and one core biopsy
obtained. Bandage placed over the puncture site.
IMPRESSION: CT guided bone marrow aspirates and core biopsy.

## 2020-07-03 IMAGING — DX DG ABDOMEN ACUTE W/ 1V CHEST
4 series · 4 of 4 positions shown · non-contrast
Comparison: Chest radiograph, 03/06/2018

CLINICAL DATA: Generalized pain and weakness. History of liver
carcinoma. Diarrhea yesterday.

EXAM:
DG ABDOMEN ACUTE W/ 1V CHEST

[chest pa]
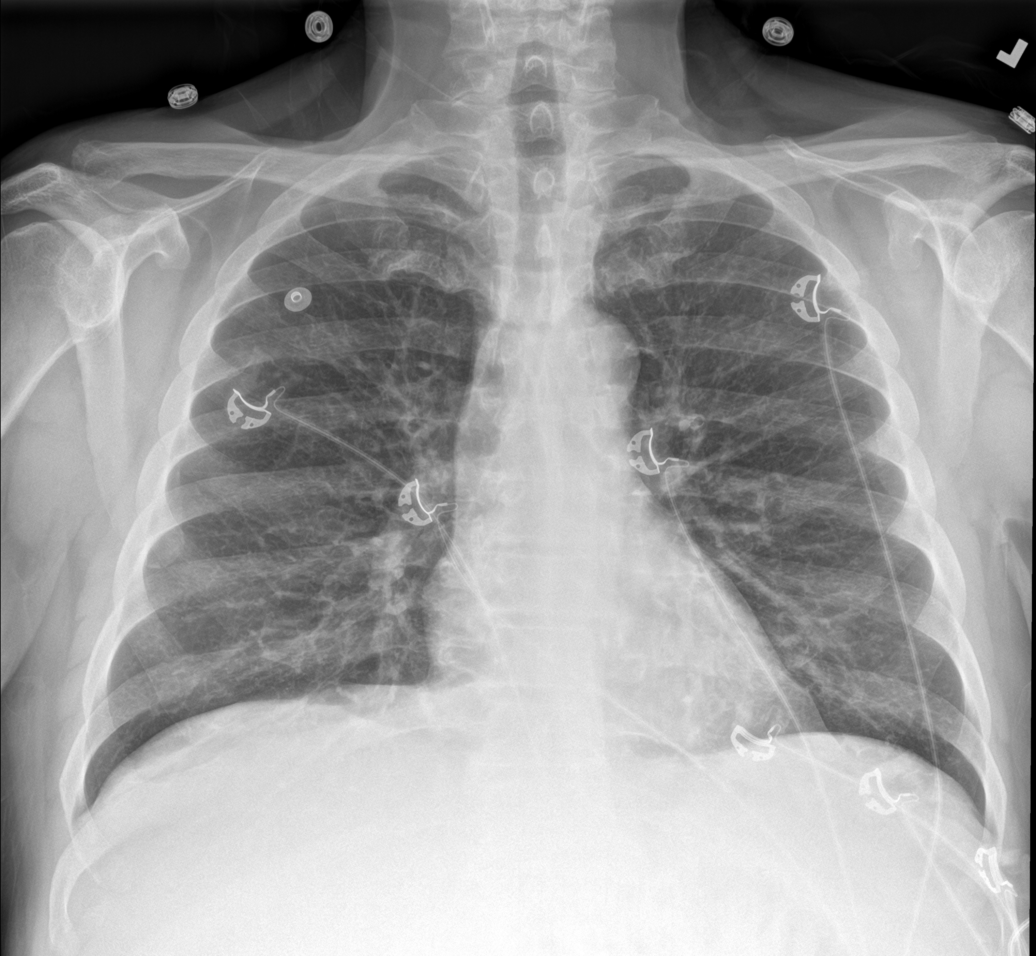

[abdomen erect]
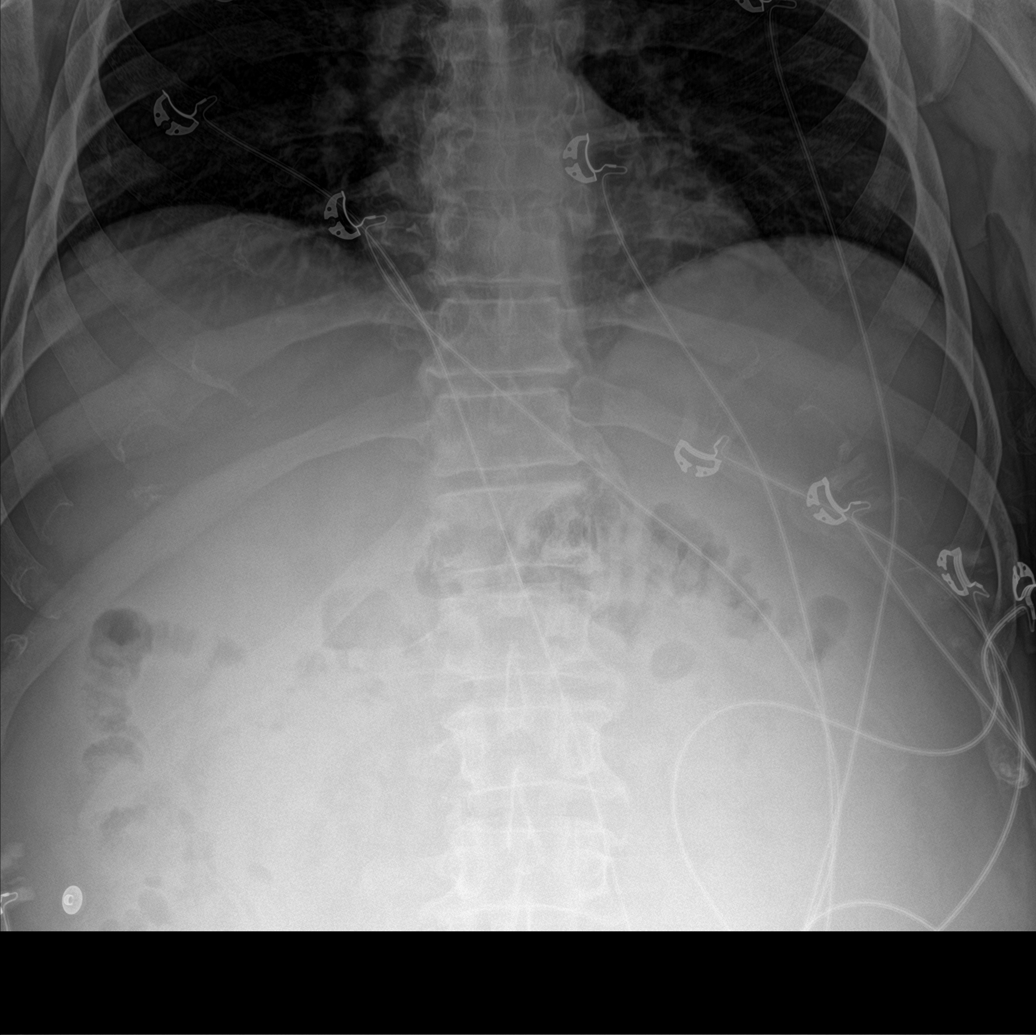

[abdomen supine (1 of 2)]
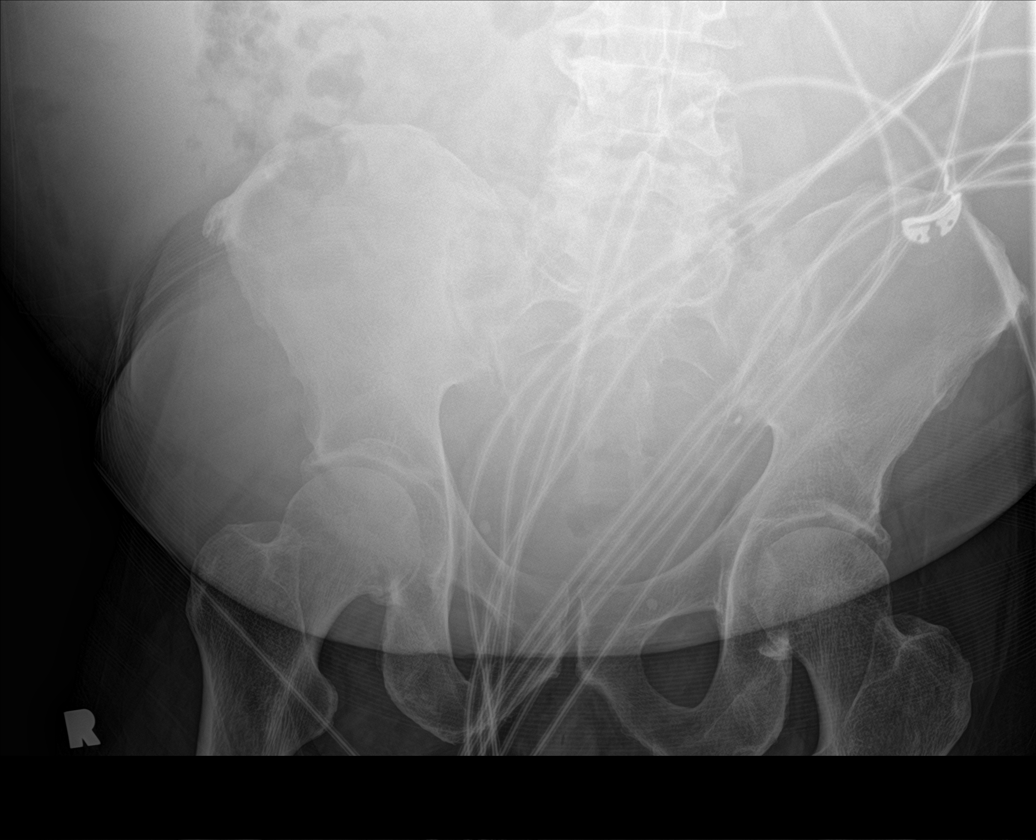

[abdomen supine (2 of 2)]
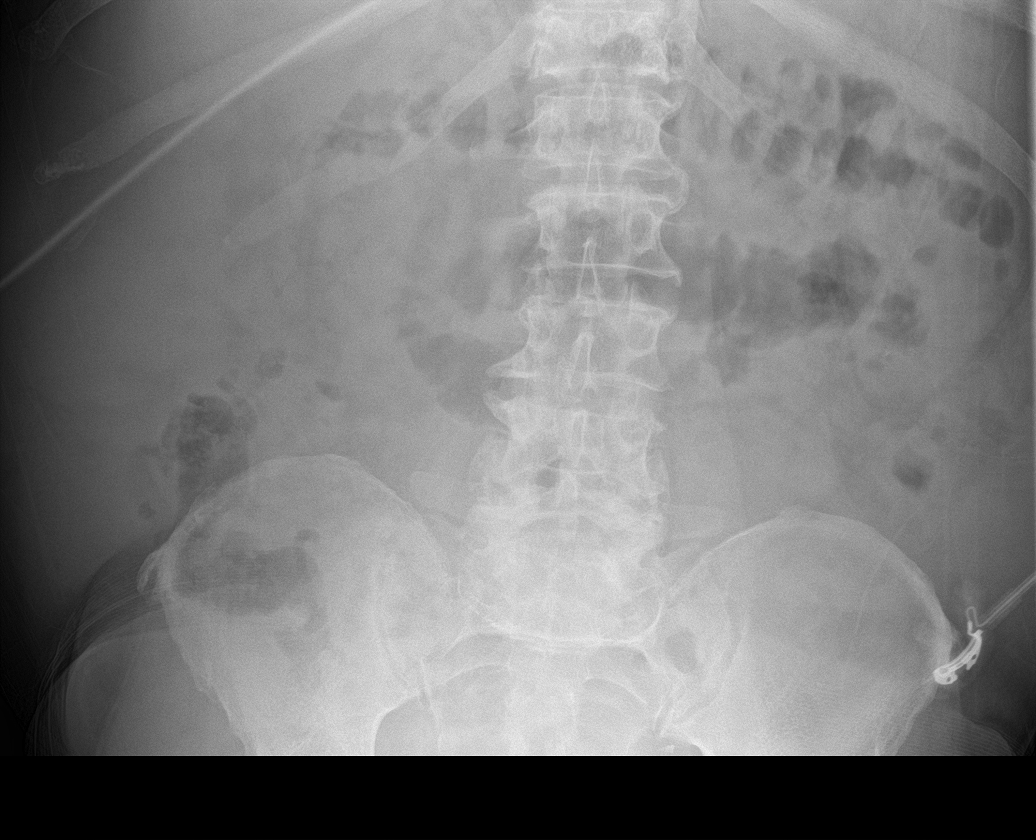

[4 of 4 positions shown; findings below may reference images not displayed]

FINDINGS: Normal bowel gas pattern. Specifically, no bowel dilation to suggest
obstruction or significant adynamic ileus. No free air.

Abdominopelvic soft tissues are not well-defined. There is no
convincing renal or ureteral stone.

Cardiac silhouette is normal in size. Normal mediastinal and hilar
contours. Clear lungs.

No acute skeletal abnormality.
IMPRESSION: 1. No acute findings. No evidence of bowel obstruction, generalized
adynamic ileus or free air.
2. No acute cardiopulmonary disease.
